# Patient Record
Sex: Female | Born: 1945 | Hispanic: No | Smoking: Never smoker
Health system: Southern US, Community
[De-identification: ages and names within clinical notes are randomized; demographics above are authoritative.]

## PROBLEM LIST (undated history)

## (undated) DIAGNOSIS — F419 Anxiety disorder, unspecified: Secondary | ICD-10-CM

## (undated) DIAGNOSIS — E119 Type 2 diabetes mellitus without complications: Secondary | ICD-10-CM

## (undated) DIAGNOSIS — T7840XA Allergy, unspecified, initial encounter: Secondary | ICD-10-CM

## (undated) DIAGNOSIS — I10 Essential (primary) hypertension: Secondary | ICD-10-CM

## (undated) DIAGNOSIS — A048 Other specified bacterial intestinal infections: Secondary | ICD-10-CM

## (undated) DIAGNOSIS — M419 Scoliosis, unspecified: Secondary | ICD-10-CM

## (undated) DIAGNOSIS — E039 Hypothyroidism, unspecified: Secondary | ICD-10-CM

## (undated) DIAGNOSIS — K5900 Constipation, unspecified: Secondary | ICD-10-CM

## (undated) HISTORY — DX: Other specified bacterial intestinal infections: A04.8

## (undated) HISTORY — PX: NASAL SINUS SURGERY: SHX719

## (undated) HISTORY — PX: TUBAL LIGATION: SHX77

## (undated) HISTORY — PX: APPENDECTOMY: SHX54

## (undated) HISTORY — DX: Scoliosis, unspecified: M41.9

## (undated) HISTORY — DX: Hypothyroidism, unspecified: E03.9

## (undated) HISTORY — DX: Type 2 diabetes mellitus without complications: E11.9

## (undated) HISTORY — PX: NECK LESION BIOPSY: SHX2078

## (undated) HISTORY — PX: UMBILICAL HERNIA REPAIR: SHX196

## (undated) HISTORY — DX: Allergy, unspecified, initial encounter: T78.40XA

## (undated) HISTORY — DX: Constipation, unspecified: K59.00

---

## 1999-05-09 ENCOUNTER — Other Ambulatory Visit: Admission: RE | Admit: 1999-05-09 | Discharge: 1999-05-09 | Payer: Self-pay | Admitting: Internal Medicine

## 2004-08-04 ENCOUNTER — Emergency Department (HOSPITAL_COMMUNITY): Admission: EM | Admit: 2004-08-04 | Discharge: 2004-08-04 | Payer: Self-pay | Admitting: Emergency Medicine

## 2004-11-15 ENCOUNTER — Ambulatory Visit: Payer: Self-pay | Admitting: Internal Medicine

## 2006-12-10 ENCOUNTER — Ambulatory Visit: Payer: Self-pay | Admitting: Internal Medicine

## 2006-12-10 LAB — CONVERTED CEMR LAB: TSH: 4.39 microintl units/mL (ref 0.35–5.50)

## 2007-02-09 ENCOUNTER — Telehealth: Payer: Self-pay | Admitting: Internal Medicine

## 2007-05-06 ENCOUNTER — Telehealth: Payer: Self-pay | Admitting: Internal Medicine

## 2007-07-16 ENCOUNTER — Ambulatory Visit: Payer: Self-pay | Admitting: Internal Medicine

## 2007-07-16 DIAGNOSIS — E039 Hypothyroidism, unspecified: Secondary | ICD-10-CM

## 2007-10-19 ENCOUNTER — Telehealth: Payer: Self-pay | Admitting: Internal Medicine

## 2008-01-25 ENCOUNTER — Ambulatory Visit: Payer: Self-pay | Admitting: Internal Medicine

## 2008-01-25 DIAGNOSIS — L80 Vitiligo: Secondary | ICD-10-CM

## 2008-09-13 ENCOUNTER — Ambulatory Visit: Payer: Self-pay | Admitting: Internal Medicine

## 2008-09-13 ENCOUNTER — Telehealth: Payer: Self-pay | Admitting: *Deleted

## 2008-09-13 DIAGNOSIS — J069 Acute upper respiratory infection, unspecified: Secondary | ICD-10-CM

## 2008-10-04 ENCOUNTER — Ambulatory Visit: Payer: Self-pay | Admitting: Internal Medicine

## 2008-10-04 DIAGNOSIS — R519 Headache, unspecified: Secondary | ICD-10-CM | POA: Insufficient documentation

## 2008-10-04 DIAGNOSIS — R51 Headache: Secondary | ICD-10-CM

## 2008-10-24 ENCOUNTER — Telehealth: Payer: Self-pay | Admitting: Internal Medicine

## 2008-10-28 ENCOUNTER — Telehealth: Payer: Self-pay | Admitting: Internal Medicine

## 2008-10-31 ENCOUNTER — Telehealth: Payer: Self-pay | Admitting: Internal Medicine

## 2008-10-31 ENCOUNTER — Ambulatory Visit: Payer: Self-pay | Admitting: Internal Medicine

## 2008-11-21 ENCOUNTER — Telehealth: Payer: Self-pay | Admitting: Internal Medicine

## 2009-07-19 ENCOUNTER — Telehealth: Payer: Self-pay | Admitting: Internal Medicine

## 2009-07-19 ENCOUNTER — Ambulatory Visit: Payer: Self-pay | Admitting: Family Medicine

## 2009-07-19 DIAGNOSIS — J019 Acute sinusitis, unspecified: Secondary | ICD-10-CM

## 2009-09-22 ENCOUNTER — Telehealth: Payer: Self-pay | Admitting: Internal Medicine

## 2009-10-06 ENCOUNTER — Ambulatory Visit: Payer: Self-pay | Admitting: Internal Medicine

## 2009-10-06 DIAGNOSIS — M549 Dorsalgia, unspecified: Secondary | ICD-10-CM | POA: Insufficient documentation

## 2009-10-24 ENCOUNTER — Telehealth: Payer: Self-pay | Admitting: Internal Medicine

## 2010-01-16 ENCOUNTER — Telehealth: Payer: Self-pay | Admitting: Internal Medicine

## 2010-01-16 ENCOUNTER — Emergency Department (HOSPITAL_COMMUNITY): Admission: EM | Admit: 2010-01-16 | Discharge: 2010-01-16 | Payer: Self-pay | Admitting: Family Medicine

## 2010-01-25 ENCOUNTER — Ambulatory Visit: Payer: Self-pay | Admitting: Family Medicine

## 2010-01-25 DIAGNOSIS — J309 Allergic rhinitis, unspecified: Secondary | ICD-10-CM | POA: Insufficient documentation

## 2010-01-31 ENCOUNTER — Telehealth: Payer: Self-pay | Admitting: Internal Medicine

## 2010-03-23 ENCOUNTER — Telehealth: Payer: Self-pay | Admitting: Internal Medicine

## 2010-06-15 ENCOUNTER — Telehealth: Payer: Self-pay | Admitting: Internal Medicine

## 2010-07-31 ENCOUNTER — Telehealth: Payer: Self-pay | Admitting: Internal Medicine

## 2010-09-03 ENCOUNTER — Telehealth: Payer: Self-pay | Admitting: Internal Medicine

## 2010-09-04 NOTE — Progress Notes (Signed)
Summary: refill alprazolam  Phone Note Refill Request Message from:  Fax from Pharmacy on January 31, 2010 10:13 AM  Refills Requested: Medication #1:  ALPRAZOLAM 0.5 MG  TABS 1/2 - 1 two times a day as needed   Last Refilled: 12/31/2009 [facility]   [phone]   Method Requested: Telephone to Pharmacy Initial call taken by: Duard Brady LPN,  January 31, 2010 10:14 AM    Prescriptions: ALPRAZOLAM 0.5 MG  TABS (ALPRAZOLAM) 1/2 - 1 two times a day as needed  #50 x 2   Entered and Authorized by:   Duard Brady LPN   Signed by:   Duard Brady LPN on 11/91/4782   Method used:   Historical   RxID:   [id number]  [facility]   kik

## 2010-09-04 NOTE — Progress Notes (Signed)
Summary: Amox rx called in  Phone Note Refill Request Call back at [phone] Message from:  Patient on October 24, 2009 2:36 PM  Refills Requested: Medication #1:  AMOXICILLIN 875 MG TABS one by mouth two times a day for 10 days..   Dosage confirmed as above?Dosage Confirmed Patient requesting refill for Amoxicillin for sinus infection.  Initial call taken by: Everrett Coombe,  October 24, 2009 2:36 PM Caller: Patient Summary of Call: Patient is requesting   Follow-up for Phone Call        Amox 500mg  [facility] - spoke with pt - instructed that if no better once completed - to see. KIK Follow-up by: Duard Brady LPN,  October 25, 2009 9:56 AM    Amoxil 500  #30 one three times a day with meals

## 2010-09-04 NOTE — Progress Notes (Signed)
Summary: ? sinus per pt.  Phone Note Call from Patient   Caller: Patient Call For: Julie Savers  MD Summary of Call: Pt is having facial pain, headache, no drainage, top of head hurts really hurts. ? chills and fever. Wants to know if she can have Flonase and Amoxicillin. [facility] [phone] Initial call taken by: Lynann Beaver CMA,  January 16, 2010 2:05 PM  Follow-up for Phone Call        generic flonase  OK;  generic augmentin 875  #20 one BID Follow-up by: Julie Savers  MD,  January 16, 2010 5:05 PM    Prescriptions: AMOXICILLIN 875 MG TABS (AMOXICILLIN) one by mouth two times a day for 10 days.  #20 x 0   Entered by:   Lynann Beaver CMA   Authorized by:   Julie Savers  MD   Signed by:   Lynann Beaver CMA on 01/17/2010   Method used:   Electronically to        [facility] [address] [phone]* (retail)       [address]       [place], [place]  [address]       Ph: [phone] or [phone]       Fax: [phone]   RxID:   [id number] FLUTICASONE PROPIONATE 50 MCG/ACT SUSP (FLUTICASONE PROPIONATE) use daily as needed  #1 x 6   Entered by:   Lynann Beaver CMA   Authorized by:   Julie Savers  MD   Signed by:   Lynann Beaver CMA on 01/17/2010   Method used:   Electronically to        [facility] [address] [phone]* (retail)       [address]       [place], [place]  [address]       Ph: [phone] or [phone]       Fax: [phone]   RxID:   [id number]

## 2010-09-04 NOTE — Letter (Signed)
Summary: Out of Work  Adult nurse at [employer]  [address]   [place], [place] [address]   Phone: [phone]  Fax: [phone]    October 06, 2009   Employee:  BERNARDINA CACHO    To Whom It May Concern:   For Medical reasons, please excuse the above named employee from work for the following dates:  Start:   10-05-2009  End:   10-09-2009   If you need additional information, please feel free to contact our office.         Sincerely,    Gordy Savers  MD

## 2010-09-04 NOTE — Assessment & Plan Note (Signed)
Summary: ?sinus inf/ok per rachel/njr   Vital Signs:  Patient profile:   65 year old female Weight:      133 pounds Temp:     98.4 degrees F oral BP sitting:   120 / 80  (left arm) Cuff size:   regular  Vitals Entered By: Kathrynn Speed CMA (January 25, 2010 4:00 PM) CC: sinus pain & prssure for 2 weeks   CC:  sinus pain & prssure for 2 weeks.  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  She has no fever, earache, sore throat, cough.  She has no facial pain.  She says it hurts on the top of her head.  She denies any neurologic symptoms.  She was given amoxicillin 875 mg b.i.d. on 615.  She has two days of medication left but after 8 days of medication.  The symptoms are unchanged.  She said no history of any sinus surgery.  Current Medications (verified): 1)  Levothyroxine Sodium 75 Mcg Tabs (Levothyroxine Sodium) .Marland Kitchen.. 1 Once Daily 2)  Alprazolam 0.5 Mg  Tabs (Alprazolam) .... 1/2 - 1 Two Times A Day As Needed 3)  Fluticasone Propionate 50 Mcg/act Susp (Fluticasone Propionate) .... Use Daily As Needed 4)  Amoxicillin 875 Mg Tabs (Amoxicillin) .... One By Mouth Two Times A Day For 10 Days.  Allergies (verified): 1)  ! Codeine PMH-FH-SH reviewed for relevance  Social History: Reviewed history from 02/24/2007 and no changes required. Never Smoked Alcohol use-no  Review of Systems      See HPI  Physical Exam  General:  Well-developed,well-nourished,in no acute distress; alert,appropriate and cooperative throughout examination Head:  Normocephalic and atraumatic without obvious abnormalities. No apparent alopecia or balding. Eyes:  No corneal or conjunctival inflammation noted. EOMI. Perrla. Funduscopic exam benign, without hemorrhages, exudates or papilledema. Vision grossly normal. Ears:   External ear exam shows no significant lesions or deformities.  Otoscopic examination reveals clear canals, tympanic membranes are intact bilaterally without bulging, retraction, inflammation or discharge. Hearing is grossly normal bilaterally. Nose:  External nasal examination shows no deformity or inflammation. Nasal mucosa are pink and moist without lesions or exudates. Mouth:  Oral mucosa and oropharynx without lesions or exudates.  Teeth in good repair. Neck:  No deformities, masses, or tenderness noted. Chest Wall:  No deformities, masses, or tenderness noted. Lungs:  Normal respiratory effort, chest expands symmetrically. Lungs are clear to auscultation, no crackles or wheezes. Cervical Nodes:  No lymphadenopathy noted   Problems:  Medical Problems Added: 1)  Dx of Headache  (ICD-784.0) 2)  Dx of Rhinitis  (ICD-477.9)  Impression & Recommendations:  Problem # 1:  RHINITIS (ICD-477.9)  Her updated medication list for this problem includes:    Fluticasone Propionate 50 Mcg/act Susp (Fluticasone propionate) ..... Use daily as needed  Problem # 2:  HEADACHE (ICD-784.0) Assessment: New  The following medications were removed from the medication list:    Tramadol Hcl 50 Mg Tabs (Tramadol hcl) ..... One by mouth q 6 hrs as needed pain  Complete Medication List: 1)  Levothyroxine Sodium 75 Mcg Tabs (Levothyroxine sodium) .Marland Kitchen.. 1 once daily 2)  Alprazolam 0.5 Mg Tabs (Alprazolam) .... 1/2 - 1 two times a day as needed 3)  Fluticasone Propionate 50 Mcg/act Susp (Fluticasone propionate) .... Use daily as needed 4)  Amoxicillin 875 Mg  Tabs (Amoxicillin) .... One by mouth two times a day for 10 days.  Patient Instructions: 1)  stop the amoxicillin.......... take plain Claritin in the morning and use a combination of afrin nasal spray and saline irrigation with a netti pot at bedtime.  Remember.  There is a 5 night limit on the afrin  2)  If you don't see any improvement.  By Monday.  Return to see Dr. Kirtland Bouchard for further evaluation

## 2010-09-04 NOTE — Progress Notes (Signed)
Summary: refill  Phone Note Call from Patient   Caller: Patient Call For: Gordy Savers  MD Summary of Call: wants [facility]ill [facility]olam [facility] Initial call taken by: Raechel Ache, RN,  September 22, 2009 4:18 PM    Prescriptions: ALPRAZOLAM 0.5 MG  TABS (ALPRAZOLAM) 1/2 - 1 two times a day as needed  #50 x 2   Entered by:   Duard Brady LPN   Authorized by:   Gordy Savers  MD   Signed by:   Duard Brady LPN on 72/53/6644   Method used:   Historical   RxID:   [id number]

## 2010-09-04 NOTE — Progress Notes (Signed)
Summary: refill levothroid  Phone Note Refill Request Message from:  Fax from Pharmacy on June 15, 2010 1:02 PM  Refills Requested: Medication #1:  LEVOTHYROXINE SODIUM 75 MCG TABS 1 once daily [facility] [place]   Method Requested: Fax to Local Pharmacy Initial call taken by: Duard Brady LPN,  June 15, 2010 1:02 PM    Prescriptions: LEVOTHYROXINE SODIUM 75 MCG TABS (LEVOTHYROXINE SODIUM) 1 once daily  #100 Each x 0   Entered by:   Duard Brady LPN   Authorized by:   Gordy Savers  MD   Signed by:   Duard Brady LPN on 66/44/0347   Method used:   Historical   RxID:   [id number]  [facility]    KIK

## 2010-09-04 NOTE — Progress Notes (Signed)
Summary: refill alprazolam  Phone Note Refill Request Message from:  Fax from Pharmacy on March 23, 2010 1:30 PM  Refills Requested: Medication #1:  ALPRAZOLAM 0.5 MG  TABS 1/2 - 1 two times a day as needed   Last Refilled: 02/26/2010 [facility] - [phone]   Method Requested: Telephone to Pharmacy Initial call taken by: Duard Brady LPN,  March 23, 2010 1:30 PM  Follow-up for Phone Call        [facility] Follow-up by: Duard Brady LPN,  March 23, 2010 1:33 PM    Prescriptions: ALPRAZOLAM 0.5 MG  TABS (ALPRAZOLAM) 1/2 - 1 two times a day as needed  #50 x 2   Entered by:   Duard Brady LPN   Authorized by:   Gordy Savers  MD   Signed by:   Duard Brady LPN on 45/40/9811   Method used:   Historical   RxID:   [id number]

## 2010-09-04 NOTE — Assessment & Plan Note (Signed)
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  History of Present Illness: 65 year old patient who is seen today for evaluation of right lumbar back pain.  She injured this last week doing heavy lifting.  She has been using some ibuprofen, rest and heat with some benefit.  no history of chronic low back pain.  Pain is maximal right lumbar area and is relieved by rest and aggravated by movement.  she was comfortable  last night and rested without pain.  There is occasionally  suggestion of some radiation down the right upper leg  Preventive Screening-Counseling & Management  Alcohol-Tobacco     Smoking Status: never  Allergies: 1)  ! Codeine  Past History:  Past Medical History: Reviewed history from 10/31/2008 and no changes required. Hypothyroidism  Review of Systems  The patient denies anorexia, fever, weight loss, weight gain, vision loss, decreased hearing, hoarseness, chest pain, syncope, dyspnea on exertion, peripheral edema, prolonged cough, headaches, hemoptysis, abdominal pain, melena, hematochezia, severe indigestion/heartburn, hematuria, incontinence, genital sores, muscle weakness, suspicious skin lesions, difficulty walking, depression, unusual weight change, abnormal bleeding, enlarged lymph nodes, angioedema, and breast masses.    Physical Exam  General:  Well-developed,well-nourished,in no acute distress; alert,appropriate and cooperative throughout examination; normal blood pressure Eyes:  No corneal or conjunctival inflammation noted. EOMI. Perrla.  Funduscopic exam benign, without hemorrhages, exudates or papilledema. Vision grossly normal. Mouth:  Oral mucosa and oropharynx without lesions or exudates.  Teeth in good repair. Neck:  No deformities, masses, or tenderness noted. Lungs:  Normal respiratory effort, chest expands symmetrically. Lungs are clear to auscultation, no crackles or wheezes. Heart:  Normal rate and regular rhythm. S1 and S2 normal without gallop, murmur, click, rub or other extra sounds. Abdomen:  Bowel sounds positive,abdomen soft and non-tender without masses, organomegaly or hernias noted. Msk:  mild tenderness right lumbar region; straight leg testing negative Neurologic:  alert & oriented X3, strength normal in all extremities, sensation intact to light touch, gait normal, and DTRs symmetrical and normal.     Impression & Recommendations:  Problem # 1:  BACK PAIN (ICD-724.5)  Her updated medication list for this problem includes:    Tramadol Hcl 50 Mg Tabs (Tramadol hcl) ..... One by mouth q 6 hrs as needed pain  Problem # 2:  HYPOTHYROIDISM (ICD-244.9)  Her updated medication list for this problem includes:    Levothyroxine Sodium 75 Mcg Tabs (Levothyroxine sodium) .Marland Kitchen... 1 once daily  Complete Medication List: 1)  Levothyroxine Sodium 75 Mcg Tabs (Levothyroxine sodium) .Marland Kitchen.. 1 once daily 2)  Alprazolam 0.5 Mg Tabs (Alprazolam) .... 1/2 - 1 two times a day as needed 3)  Fluticasone Propionate 50 Mcg/act Susp (Fluticasone propionate) .... Use daily as needed 4)  Tramadol Hcl 50 Mg Tabs (Tramadol hcl) .... One by mouth q 6 hrs as needed pain 5)  Amoxicillin 875 Mg Tabs (Amoxicillin) .... One by mouth two times a day for  10 days.  Patient Instructions: 1)  Most patients (90%) with low back pain will improve with time (2-6 weeks). Keep active but avoid activities that are painful. Apply moist heat  to lower back several times a day. 2)  Arthrotec 75 one twice daily Prescriptions: TRAMADOL HCL 50 MG TABS  (TRAMADOL HCL) one by mouth q 6 hrs as needed pain  #30 x 3   Entered and Authorized by:   Gordy Savers  MD   Signed by:   Gordy Savers  MD on 10/06/2009   Method used:   Print then Give to Patient   RxID:   [id number] ALPRAZOLAM 0.5 MG  TABS (ALPRAZOLAM) 1/2 - 1 two times a day as needed  #50 x 2   Entered and Authorized by:   Gordy Savers  MD   Signed by:   Gordy Savers  MD on 10/06/2009   Method used:   Print then Give to Patient   RxID:   [id number] LEVOTHYROXINE SODIUM 75 MCG TABS (LEVOTHYROXINE SODIUM) 1 once daily  #100 Each x 1   Entered and Authorized by:   Gordy Savers  MD   Signed by:   Gordy Savers  MD on 10/06/2009   Method used:   Print then Give to Patient   RxID:   [id number]

## 2010-09-06 NOTE — Progress Notes (Signed)
Summary: refill alprazolam  Phone Note Refill Request Message from:  Fax from Pharmacy on July 31, 2010 9:28 AM  Refills Requested: Medication #1:  ALPRAZOLAM 0.5 MG  TABS 1/2 - 1 two times a day as needed   Last Refilled: 06/24/2010 [facility]   Method Requested: Fax to Local Pharmacy Initial call taken by: Duard Brady LPN,  July 31, 2010 9:29 AM  Follow-up for Phone Call        #90   RF 2 Follow-up by: Gordy Savers  MD,  July 31, 2010 12:32 PM    Prescriptions: ALPRAZOLAM 0.5 MG  TABS (ALPRAZOLAM) 1/2 - 1 two times a day as needed  #90 x 2   Entered by:   Duard Brady LPN   Authorized by:   Gordy Savers  MD   Signed by:   Duard Brady LPN on 81/19/1478   Method used:   Historical   RxID:   [id number]  [facility]    kik

## 2010-09-12 NOTE — Progress Notes (Signed)
  Phone Note Call from Patient   Caller: Patient Call For: Gordy Savers  MD Summary of Call: Pt. is having facial pain with congestion and fever this weekend.  Would like an antibiotic for sinus infection. [facility]ico[facility]) 05/09/1946 Initial call taken by: Lynann Beaver CMA AAMA,  September 03, 2010 9:07 AM  Follow-up for Phone Call        give instructions for URI; notify that bacterial infection very unlikely if symptoms are less than 10 days;   Follow-up by: Gordy Savers  MD,  September 03, 2010 12:48 PM  Additional Follow-up for Phone Call Additional follow up Details #1::        Notified pt to use Mucinex DM and Advil as needed, and call back if worse after 10 days. Additional Follow-up by: Lynann Beaver CMA AAMA,  September 03, 2010 1:53 PM

## 2010-09-26 ENCOUNTER — Inpatient Hospital Stay (INDEPENDENT_AMBULATORY_CARE_PROVIDER_SITE_OTHER)
Admission: RE | Admit: 2010-09-26 | Discharge: 2010-09-26 | Disposition: A | Discharge status: Home / Self Care | Payer: Medicare PPO | Source: Ambulatory Visit | Attending: Emergency Medicine | Admitting: Emergency Medicine

## 2010-09-26 DIAGNOSIS — N3090 Cystitis, unspecified without hematuria: Secondary | ICD-10-CM

## 2010-09-26 LAB — POCT URINALYSIS DIPSTICK
Ketones, ur: 15 mg/dL — AB
Protein, ur: >=300 mg/dL — AB
Specific Gravity, Urine: 1.01 (ref 1.005–1.030)

## 2010-09-28 LAB — URINE CULTURE
Colony Count: >=100000
Culture  Setup Time: 201202222152

## 2010-10-02 ENCOUNTER — Other Ambulatory Visit (INDEPENDENT_AMBULATORY_CARE_PROVIDER_SITE_OTHER): Payer: Medicare PPO | Admitting: Internal Medicine

## 2010-10-02 DIAGNOSIS — E039 Hypothyroidism, unspecified: Secondary | ICD-10-CM

## 2010-10-02 DIAGNOSIS — Z0000 Encounter for general adult medical examination without abnormal findings: Secondary | ICD-10-CM

## 2010-10-02 DIAGNOSIS — E785 Hyperlipidemia, unspecified: Secondary | ICD-10-CM

## 2010-10-02 LAB — POCT URINALYSIS DIPSTICK
Ketones, UA: NEGATIVE
Protein, UA: NEGATIVE
Spec Grav, UA: 1.02
Urobilinogen, UA: 0.2
pH, UA: 5.5

## 2010-10-02 LAB — LDL CHOLESTEROL, DIRECT: Direct LDL: 153 mg/dL

## 2010-10-02 LAB — HEPATIC FUNCTION PANEL
AST: 20 U/L (ref 0–37)
Alkaline Phosphatase: 61 U/L (ref 39–117)
Total Bilirubin: 0.8 mg/dL (ref 0.3–1.2)

## 2010-10-02 LAB — LIPID PANEL
Cholesterol: 239 mg/dL — ABNORMAL HIGH (ref 0–200)
HDL: 68 mg/dL (ref >39.00)
Total CHOL/HDL Ratio: 4
VLDL: 18.2 mg/dL (ref 0.0–40.0)

## 2010-10-02 LAB — CBC WITH DIFFERENTIAL/PLATELET
Basophils Absolute: 0.1 10*3/uL (ref 0.0–0.1)
Eosinophils Absolute: 0.3 10*3/uL (ref 0.0–0.7)
Lymphocytes Relative: 23.8 % (ref 12.0–46.0)
MCHC: 34 g/dL (ref 30.0–36.0)
Monocytes Relative: 5.7 % (ref 3.0–12.0)
Platelets: 284 10*3/uL (ref 150.0–400.0)
RDW: 13.8 % (ref 11.5–14.6)

## 2010-10-02 LAB — BASIC METABOLIC PANEL
BUN: 15 mg/dL (ref 6–23)
Calcium: 9.3 mg/dL (ref 8.4–10.5)
Creatinine, Ser: 1.1 mg/dL (ref 0.4–1.2)
GFR: 55.8 mL/min — ABNORMAL LOW (ref >60.00)
Glucose, Bld: 92 mg/dL (ref 70–99)
Sodium: 137 mEq/L (ref 135–145)

## 2010-10-03 ENCOUNTER — Other Ambulatory Visit: Payer: Self-pay

## 2010-10-03 ENCOUNTER — Other Ambulatory Visit: Payer: Self-pay | Admitting: Internal Medicine

## 2010-10-03 DIAGNOSIS — E039 Hypothyroidism, unspecified: Secondary | ICD-10-CM

## 2010-10-03 MED ORDER — ALPRAZOLAM 0.5 MG PO TABS: 0.5000 mg | ORAL_TABLET | Freq: Two times a day (BID) | ORAL | Status: DC | PRN

## 2010-10-03 MED ORDER — LEVOTHYROXINE SODIUM 100 MCG PO TABS: 100.0000 ug | ORAL_TABLET | Freq: Every day | ORAL | Status: DC

## 2010-10-03 NOTE — Progress Notes (Signed)
Quick Note:  Spoke with pt - informed of labs - on - will change med record and call out new rx [facility] [address]. KIK ______

## 2010-10-03 NOTE — Telephone Encounter (Signed)
Faxed back to [facility]

## 2010-10-08 ENCOUNTER — Encounter: Payer: Self-pay | Admitting: Internal Medicine

## 2010-10-08 ENCOUNTER — Ambulatory Visit (INDEPENDENT_AMBULATORY_CARE_PROVIDER_SITE_OTHER): Payer: Medicare PPO | Admitting: Internal Medicine

## 2010-10-08 VITALS — BP 110/70 | HR 77 | Temp 98.0°F | Resp 16 | Ht 65.0 in | Wt 133.0 lb

## 2010-10-08 DIAGNOSIS — E039 Hypothyroidism, unspecified: Secondary | ICD-10-CM

## 2010-10-08 DIAGNOSIS — J309 Allergic rhinitis, unspecified: Secondary | ICD-10-CM

## 2010-10-08 DIAGNOSIS — Z136 Encounter for screening for cardiovascular disorders: Secondary | ICD-10-CM

## 2010-10-08 DIAGNOSIS — Z0000 Encounter for general adult medical examination without abnormal findings: Secondary | ICD-10-CM

## 2010-10-08 MED ORDER — ALPRAZOLAM 0.5 MG PO TABS: 0.5000 mg | ORAL_TABLET | Freq: Two times a day (BID) | ORAL | Status: DC | PRN

## 2010-10-08 MED ORDER — FLUTICASONE PROPIONATE 50 MCG/ACT NA SUSP: 1.0000 | Freq: Every day | NASAL | Status: DC

## 2010-10-08 MED ORDER — LEVOTHYROXINE SODIUM 100 MCG PO TABS: 100.0000 ug | ORAL_TABLET | Freq: Every day | ORAL | Status: DC

## 2010-10-08 NOTE — Patient Instructions (Signed)
It is important that you exercise regularly, at least 20 minutes 3 to 4 times per week.  If you develop chest pain or shortness of breath seek  medical attention.  Schedule your mammogram.  Schedule your colonoscopy to help detect colon cancer.  Take a calcium supplement, plus [phone] units of vitamin D  Return in one year for follow-up

## 2010-10-08 NOTE — Progress Notes (Signed)
Subjective:    Patient ID: Julie Duffy, female    DOB: 03-Jun-1946, 65 y.o.   MRN: [id number]  HPI  65 year old patient who is seen today for a preventative health examination. She has a history of hypothyroidism and her levothyroxin dose has recently been up titrated.  She does quite well. He has not been seen for a physical in some time. She's never had a screening colonoscopy in spite of a family history of colon cancer. She has had no mammograms. She does have a history of allergic rhinitis vitiligo and occasional back pain and headaches. In general she does quite well. She is a lifelong nonsmoker.  1. Risk factors, based on past  M,S,F history-   No significant cardiovascular risk factors; mother had congestive heart failure at a later age  81.  Physical activities:  No activity  restrictions but no regular exercise  3.  Depression/mood: history depression or mood disorder  4.  Hearing: no deficits  5.  ADL's: independent in all aspects of daily living  6.  Fall risk: low  7.  Home safety: no problems identified  8.  Height weight, and visual acuity; height and weight stable no change in visual acuity  9.  Counseling: colonoscopy vitamin D and calcium supplementation and heart healthy diet all encouraged. Regular exercise program encouraged  10. Lab orders based on risk factors: laboratory profile reviewed TSH was elevated and Synthroid dose adjusted  11. Referral-  GI for screening colonoscopy 12. Care plan:  Mammogram encouraged heart healthy diet regular exercise  13. Cognitive assessment:  Alert and oriented with normal affect no cognitive dysfunction     Review of Systems  Constitutional: Negative for fever, appetite change, fatigue and unexpected weight change.  HENT: Negative for hearing loss, ear pain, nosebleeds, congestion, sore throat, mouth sores, trouble swallowing, neck stiffness, dental problem, voice change, sinus pressure and tinnitus.   Eyes: Negative for  photophobia, pain, redness and visual disturbance.  Respiratory: Negative for cough, chest tightness and shortness of breath.   Cardiovascular: Negative for chest pain, palpitations and leg swelling.  Gastrointestinal: Negative for nausea, vomiting, abdominal pain, diarrhea, constipation, blood in stool, abdominal distention and rectal pain.  Genitourinary: Negative for dysuria, urgency, frequency, hematuria, flank pain, vaginal bleeding, vaginal discharge, difficulty urinating, genital sores, vaginal pain, menstrual problem and pelvic pain.  Musculoskeletal: Negative for back pain and arthralgias.  Skin: Negative for rash.  Neurological: Negative for dizziness, syncope, speech difficulty, weakness, light-headedness, numbness and headaches.  Hematological: Negative for adenopathy. Does not bruise/bleed easily.  Psychiatric/Behavioral: Negative for suicidal ideas, behavioral problems, self-injury, dysphoric mood and agitation. The patient is not nervous/anxious.        Objective:   Physical Exam  Constitutional: She is oriented to person, place, and time. She appears well-developed and well-nourished.  HENT:  Head: Normocephalic and atraumatic.  Right Ear: External ear normal.  Left Ear: External ear normal.  Mouth/Throat: Oropharynx is clear and moist.  Eyes: Conjunctivae and EOM are normal.  Neck: Normal range of motion. Neck supple. No JVD present. No thyromegaly present.  Cardiovascular: Normal rate, regular rhythm, normal heart sounds and intact distal pulses.   No murmur heard. Pulmonary/Chest: Effort normal and breath sounds normal. She has no wheezes. She has no rales.  Abdominal: Soft. Bowel sounds are normal. She exhibits no distension and no mass. There is no tenderness. There is no rebound and no guarding.  Genitourinary: Vagina normal and uterus normal. Guaiac negative stool. No vaginal discharge  found.  Musculoskeletal: Normal range of motion. She exhibits no edema and no  tenderness.  Neurological: She is alert and oriented to person, place, and time. She has normal reflexes. No cranial nerve deficit. She exhibits normal muscle tone. Coordination normal.  Skin: Skin is warm and dry. No rash noted.  Psychiatric: She has a normal mood and affect. Her behavior is normal.          Assessment & Plan:   unremarkable clinical examination  Hypothyroidism  Allergic rhinitis- we'll place on fluticasone and continue over-the-counter antihistamines  Calcium and vitamin D recommended  Mammogram recommended  Screening colonoscopy recommended

## 2010-11-02 ENCOUNTER — Telehealth: Payer: Self-pay | Admitting: *Deleted

## 2010-11-02 NOTE — Telephone Encounter (Signed)
When patients  call in for this type of request make them aware that it is not the office policy to call in antibiotics without an office visit

## 2010-11-02 NOTE — Telephone Encounter (Signed)
Asking for antibiotic for sinus infection and needs it called to [facility].  Please call pt back.

## 2010-11-02 NOTE — Telephone Encounter (Signed)
Per Dr. Kirtland Bouchard ........Marland Kitchenneeds office visit.

## 2010-11-05 ENCOUNTER — Telehealth: Payer: Self-pay | Admitting: *Deleted

## 2010-11-05 NOTE — Telephone Encounter (Signed)
Pt did not call back for appt.

## 2010-11-23 ENCOUNTER — Other Ambulatory Visit (INDEPENDENT_AMBULATORY_CARE_PROVIDER_SITE_OTHER): Payer: Self-pay | Admitting: Otolaryngology

## 2010-11-27 ENCOUNTER — Ambulatory Visit
Admission: RE | Admit: 2010-11-27 | Discharge: 2010-11-27 | Disposition: A | Discharge status: Home / Self Care | Payer: Medicare PPO | Source: Ambulatory Visit | Attending: Otolaryngology | Admitting: Otolaryngology

## 2010-11-28 ENCOUNTER — Ambulatory Visit (AMBULATORY_SURGERY_CENTER): Payer: Medicare PPO | Admitting: *Deleted

## 2010-11-28 ENCOUNTER — Encounter: Payer: Self-pay | Admitting: Gastroenterology

## 2010-11-28 VITALS — Ht 64.0 in | Wt 130.0 lb

## 2010-11-28 DIAGNOSIS — Z1211 Encounter for screening for malignant neoplasm of colon: Secondary | ICD-10-CM

## 2010-11-28 DIAGNOSIS — Z800 Family history of malignant neoplasm of digestive organs: Secondary | ICD-10-CM

## 2010-11-28 MED ORDER — PEG-KCL-NACL-NASULF-NA ASC-C 100 G PO SOLR: 1.0000 | Freq: Once | ORAL | Status: AC

## 2010-12-10 ENCOUNTER — Telehealth: Payer: Self-pay | Admitting: Gastroenterology

## 2010-12-10 NOTE — Telephone Encounter (Signed)
Pt had concerns about being on clear liquids all day day before procedure.  Reviewed clear liquid diet with patient.  Julie Duffy

## 2010-12-11 ENCOUNTER — Encounter: Payer: Self-pay | Admitting: Gastroenterology

## 2010-12-12 ENCOUNTER — Encounter: Payer: Self-pay | Admitting: Gastroenterology

## 2010-12-12 ENCOUNTER — Ambulatory Visit (AMBULATORY_SURGERY_CENTER): Payer: Medicare PPO | Admitting: Gastroenterology

## 2010-12-12 VITALS — BP 146/74 | HR 60 | Temp 97.9°F | Resp 18 | Ht 64.0 in | Wt 130.0 lb

## 2010-12-12 DIAGNOSIS — Z1211 Encounter for screening for malignant neoplasm of colon: Secondary | ICD-10-CM

## 2010-12-12 DIAGNOSIS — Z800 Family history of malignant neoplasm of digestive organs: Secondary | ICD-10-CM

## 2010-12-12 MED ORDER — SODIUM CHLORIDE 0.9 % IV SOLN: 500.0000 mL | INTRAVENOUS | Status: DC

## 2010-12-12 NOTE — Patient Instructions (Signed)
Follow discharge instructions.  Resume your medications.  Next colonoscopy in 5 years.

## 2010-12-13 ENCOUNTER — Telehealth: Payer: Self-pay | Admitting: *Deleted

## 2010-12-13 NOTE — Telephone Encounter (Signed)

## 2010-12-14 ENCOUNTER — Telehealth: Payer: Self-pay | Admitting: Gastroenterology

## 2010-12-14 NOTE — Telephone Encounter (Signed)
Informed pt it's ok to resume her normal meds- pt verbalized understanding.

## 2011-01-02 ENCOUNTER — Encounter: Payer: Self-pay | Admitting: Internal Medicine

## 2011-01-02 ENCOUNTER — Ambulatory Visit (INDEPENDENT_AMBULATORY_CARE_PROVIDER_SITE_OTHER): Payer: Medicare PPO | Admitting: Internal Medicine

## 2011-01-02 DIAGNOSIS — E039 Hypothyroidism, unspecified: Secondary | ICD-10-CM

## 2011-01-02 LAB — TSH: TSH: 0.2 u[IU]/mL — ABNORMAL LOW (ref 0.35–5.50)

## 2011-01-02 MED ORDER — FLUTICASONE PROPIONATE 50 MCG/ACT NA SUSP: 1.0000 | Freq: Every day | NASAL | Status: DC

## 2011-01-02 NOTE — Patient Instructions (Signed)
Drink as much fluid as you  can tolerate over the next few days 

## 2011-01-02 NOTE — Progress Notes (Signed)
  Subjective:    Patient ID: Julie Duffy, female    DOB: Feb 19, 1946, 65 y.o.   MRN: [id number]  HPI  65 year old patient who is seen today with some low blood pressure concerns. Yesterday she felt slightly weak and was noted to have a home blood pressure reading of 98/50 with a pulse rate of 47. She states for the past 2 weeks she has been a bit sluggish. Approximately 3 months ago her levothyroxine dose was up titrated to 100 mcg daily due to an elevated TSH. Today she feels improved. Denies any history of syncope    Review of Systems  Constitutional: Positive for fatigue.  HENT: Negative for hearing loss, congestion, sore throat, rhinorrhea, dental problem, sinus pressure and tinnitus.   Eyes: Negative for pain, discharge and visual disturbance.  Respiratory: Negative for cough and shortness of breath.   Cardiovascular: Negative for chest pain, palpitations and leg swelling.  Gastrointestinal: Negative for nausea, vomiting, abdominal pain, diarrhea, constipation, blood in stool and abdominal distention.  Genitourinary: Negative for dysuria, urgency, frequency, hematuria, flank pain, vaginal bleeding, vaginal discharge, difficulty urinating, vaginal pain and pelvic pain.  Musculoskeletal: Negative for joint swelling, arthralgias and gait problem.  Skin: Negative for rash.  Neurological: Negative for dizziness, syncope, speech difficulty, weakness, numbness and headaches.  Hematological: Negative for adenopathy.  Psychiatric/Behavioral: Negative for behavioral problems, dysphoric mood and agitation. The patient is not nervous/anxious.        Objective:   Physical Exam  Constitutional: She is oriented to person, place, and time. She appears well-developed and well-nourished. No distress.       Blood pressure as high as 120/80 100/70 on arrival  HENT:  Head: Normocephalic.  Right Ear: External ear normal.  Left Ear: External ear normal.  Mouth/Throat: Oropharynx is clear and moist.    Eyes: Conjunctivae and EOM are normal. Pupils are equal, round, and reactive to light.  Neck: Normal range of motion. Neck supple. No thyromegaly present.  Cardiovascular: Normal rate, regular rhythm, normal heart sounds and intact distal pulses.        Pulse rate 50  Pulmonary/Chest: Effort normal and breath sounds normal.       O2 saturation 97%  Abdominal: Soft. Bowel sounds are normal. She exhibits no mass. There is no tenderness.  Musculoskeletal: Normal range of motion.  Lymphadenopathy:    She has no cervical adenopathy.  Neurological: She is alert and oriented to person, place, and time.  Skin: Skin is warm and dry. No rash noted.  Psychiatric: She has a normal mood and affect. Her behavior is normal.          Assessment & Plan:   Hypothyroidism Mild sinus bradycardia  Disposition we'll check a TSH today. Will likely require up titration. She has been asked to force fluids especially with the warmer weather. Will return here when necessary

## 2011-01-03 ENCOUNTER — Ambulatory Visit: Payer: Medicare PPO | Admitting: Internal Medicine

## 2011-01-03 ENCOUNTER — Other Ambulatory Visit: Payer: Self-pay | Admitting: Internal Medicine

## 2011-01-03 MED ORDER — LEVOTHYROXINE SODIUM 88 MCG PO TABS: 88.0000 ug | ORAL_TABLET | Freq: Every day | ORAL | Status: DC

## 2011-01-03 NOTE — Progress Notes (Signed)
Quick Note:  Spoke with pt - informed of labs and new med to be started . [facility] [address] ______

## 2011-03-15 ENCOUNTER — Other Ambulatory Visit: Payer: Self-pay | Admitting: *Deleted

## 2011-03-15 MED ORDER — ALPRAZOLAM 0.5 MG PO TABS: 0.5000 mg | ORAL_TABLET | Freq: Two times a day (BID) | ORAL | Status: DC | PRN

## 2011-03-20 NOTE — Progress Notes (Signed)
Addended by: Sarina Ill ANN on: 03/20/2011 03:42 PM   Modules accepted: Orders, Level of Service

## 2011-04-21 ENCOUNTER — Other Ambulatory Visit: Payer: Self-pay | Admitting: Internal Medicine

## 2011-06-06 ENCOUNTER — Other Ambulatory Visit: Payer: Self-pay

## 2011-06-06 MED ORDER — ALPRAZOLAM 0.5 MG PO TABS: 0.5000 mg | ORAL_TABLET | Freq: Two times a day (BID) | ORAL | Status: DC | PRN

## 2011-06-06 NOTE — Telephone Encounter (Signed)
Called into [facility]

## 2011-06-14 ENCOUNTER — Other Ambulatory Visit: Payer: Self-pay | Admitting: Internal Medicine

## 2011-06-17 NOTE — Telephone Encounter (Signed)
Called into [facility] - they did not recv phone from 06/06/2011

## 2011-07-01 ENCOUNTER — Telehealth: Payer: Self-pay | Admitting: *Deleted

## 2011-07-01 NOTE — Telephone Encounter (Signed)
patient  Is calling because she has been awaken at night with elevated blood pressure, shaking, nausea, and very hot.  She does have a history of thyroid problems.  She has called cardiology but can't be seen until Thursday.  Her blood pressures have been 160-170/100.  Any suggestions?

## 2011-07-01 NOTE — Telephone Encounter (Signed)
Spoke with patient.

## 2011-07-01 NOTE — Telephone Encounter (Signed)
Hold thyroid med; ROV this week

## 2011-07-04 ENCOUNTER — Encounter: Payer: Self-pay | Admitting: Cardiovascular Disease

## 2011-07-04 ENCOUNTER — Ambulatory Visit (INDEPENDENT_AMBULATORY_CARE_PROVIDER_SITE_OTHER): Payer: Medicare PPO | Admitting: Cardiovascular Disease

## 2011-07-04 VITALS — BP 153/81 | HR 58 | Ht 64.0 in | Wt 133.0 lb

## 2011-07-04 DIAGNOSIS — I10 Essential (primary) hypertension: Secondary | ICD-10-CM

## 2011-07-04 NOTE — Assessment & Plan Note (Signed)
She has swings in her BP with very strange episodes of burning over her body at night. Will get echocardiogram to exclude structural heart disease and assess LV function. Will also get renal artery dopplers to exclude renal artery stenosis. Her symptoms are very odd. This could all be related to menopausal changes although she reports having undergone menopause many years ago. This could be related to her thyroid. She has plans to see Dr. Lesia Hausen tomorrow. Further down on the differential is carcinoid syndrome. If no resolution, of symptoms over the next few weeks, a workup for carcinoid should be pursued.

## 2011-07-04 NOTE — Patient Instructions (Signed)
Your physician recommends that you schedule a follow-up appointment in: 2 weeks  Your physician has requested that you have an echocardiogram. Echocardiography is a painless test that uses sound waves to create images of your heart. It provides your doctor with information about the size and shape of your heart and how well your heart's chambers and valves are working. This procedure takes approximately one hour. There are no restrictions for this procedure.   Your physician has requested that you have a renal artery duplex. During this test, an ultrasound is used to evaluate blood flow to the kidneys. Allow one hour for this exam. Do not eat after midnight the day before and avoid carbonated beverages. Take your medications as you usually do.

## 2011-07-04 NOTE — Progress Notes (Signed)
   History of Present Illness: 65 yo WF with history of hypothyroidism here today for cardiac evaluation. She tells me that she has no known cardiac disease. She is a self referral today. She tells me that when she is in a deep sleep she occasionally wakes and feels burning in her arms, face, neck. Feels like "burning in my veins". She feels then like she may need to have a bowel movement. No diarrhea. She has mentioned this in the past to providers. This has been occurring for the last year but worse over the last month. When this happens, her BP is elevated, often as high as 150 systolically. She is known to have blood pressures of 110-110 during the day. She has already gone through menopause but she used estrogen patches and had no hot flashes. No chest pain, SOB, palpitations, near syncope or syncope.   Past Medical History  Diagnosis Date  . Hypothyroid   . Allergy     seasonal  . Scoliosis     Past Surgical History  Procedure Date  . Nasal sinus surgery   . Neck lesion biopsy     cysts benign  . Umbilical hernia repair   . Appendectomy   . Tubal ligation     Current Outpatient Prescriptions  Medication Sig Dispense Refill  . ALPRAZolam (XANAX) 0.5 MG tablet TAKE 1/2 TO 1 TABLET BY MOUTH TWICE DAILY AS NEEDED  50 tablet  1  . fluticasone (FLONASE) 50 MCG/ACT nasal spray Place 1 spray into the nose daily.  16 g  2  . levothyroxine (SYNTHROID) 88 MCG tablet Take 1 tablet (88 mcg total) by mouth daily.  90 tablet  3    Allergies  Allergen Reactions  . Clindamycin/Lincomycin Cross Reactors Other (See Comments)    GI intolerance  . Codeine     History   Social History  . Marital Status: Divorced    Spouse Name: N/A    Number of Children: N/A  . Years of Education: N/A   Occupational History  . Not on file.   Social History Main Topics  . Smoking status: Never Smoker   . Smokeless tobacco: Never Used  . Alcohol Use: No  . Drug Use: No  . Sexually Active: Not on file    Other Topics Concern  . Not on file   Social History Narrative  . No narrative on file    Family History  Problem Relation Age of Onset  . Cancer Mother     colon ca  . Heart disease Mother   . Colon cancer Mother   . Cancer Father     Review of Systems:  As stated in the HPI and otherwise negative.   BP 153/81  Pulse 58  Ht 5\' 4"  (1.626 m)  Wt 133 lb (60.328 kg)  BMI 22.83 kg/m2  Physical Examination: General: Well developed, well nourished, NAD HEENT: OP clear, mucus membranes moist SKIN: warm, dry. No rashes. Neuro: No focal deficits Musculoskeletal: Muscle strength 5/5 all ext Psychiatric: Mood and affect normal Neck: No JVD, no carotid bruits, no thyromegaly, no lymphadenopathy. Lungs:Clear bilaterally, no wheezes, rhonci, crackles Cardiovascular: Regular rate and rhythm. No murmurs, gallops or rubs. Abdomen:Soft. Bowel sounds present. Non-tender.  Extremities: No lower extremity edema. Pulses are 2 + in the bilateral DP/PT.  EKG: NSR, rate 62 bpm. Possible right atrial enlargement.

## 2011-07-05 ENCOUNTER — Encounter: Payer: Self-pay | Admitting: Internal Medicine

## 2011-07-05 ENCOUNTER — Ambulatory Visit: Payer: Medicare PPO | Admitting: Cardiology

## 2011-07-05 ENCOUNTER — Ambulatory Visit (INDEPENDENT_AMBULATORY_CARE_PROVIDER_SITE_OTHER): Payer: Medicare PPO | Admitting: Internal Medicine

## 2011-07-05 VITALS — BP 128/80

## 2011-07-05 DIAGNOSIS — E039 Hypothyroidism, unspecified: Secondary | ICD-10-CM

## 2011-07-05 DIAGNOSIS — R232 Flushing: Secondary | ICD-10-CM

## 2011-07-05 NOTE — Progress Notes (Signed)
  Subjective:    Patient ID: Julie Duffy, female    DOB: 06-16-1946, 65 y.o.   MRN: [id number]  HPI  65 year old patient who has a history of treated hypothyroidism. Intermittently for the past year and worsened over the past month she has had the nocturnal awakening due to what she describes as burning and tingling. This affects primarily her arms but also her facial area. There has been no other constitutional complaints. She does have a history of mild anxiety disorder and does take alprazolam. She was self referred to cardiology yesterday and a 2-D echocardiogram and renal artery ultrasound ordered to exclude structural heart disease and renal artery stenosis.    Review of Systems  Neurological:       Tingling and burning dysesthesias of her arms and face       Objective:   Physical Exam  Constitutional: She is oriented to person, place, and time. She appears well-developed and well-nourished. No distress.       Blood pressure 120/80    HENT:  Head: Normocephalic.  Right Ear: External ear normal.  Left Ear: External ear normal.  Mouth/Throat: Oropharynx is clear and moist.  Eyes: Conjunctivae and EOM are normal. Pupils are equal, round, and reactive to light.  Neck: Normal range of motion. Neck supple. No thyromegaly present.  Cardiovascular: Normal rate, regular rhythm, normal heart sounds and intact distal pulses.   Pulmonary/Chest: Effort normal and breath sounds normal.  Abdominal: Soft. Bowel sounds are normal. She exhibits no mass. There is no tenderness.  Musculoskeletal: Normal range of motion.  Lymphadenopathy:    She has no cervical adenopathy.  Neurological: She is alert and oriented to person, place, and time.  Skin: Skin is warm and dry. No rash noted.  Psychiatric: She has a normal mood and affect. Her behavior is normal.          Assessment & Plan:   Nocturnal dysesthesias. We'll proceed with cardiac investigation first. We'll check a TSH today. We'll  recheck in 4 weeks and assess her status. Due to the chronicity of her symptoms if they persist may be out of bed to rule out esoteric problem such as carcinoid and pheochromocytoma

## 2011-07-05 NOTE — Patient Instructions (Signed)
Avoids foods high in acid such as tomatoes citrus juices, and spicy foods.  Avoid eating within two hours of lying down or before exercising.   Return in one month for follow-up

## 2011-07-09 ENCOUNTER — Telehealth: Payer: Self-pay

## 2011-07-09 NOTE — Telephone Encounter (Signed)
Pt called back with questions about her thyroid levels and medication.  Pt is aware to take an extra thyroid pill until her TSH levels are drawn again.

## 2011-07-09 NOTE — Progress Notes (Signed)
Quick Note:  Spoke with pt- instructed to take extra tablet on Sunday. ______

## 2011-07-17 ENCOUNTER — Ambulatory Visit (HOSPITAL_COMMUNITY): Payer: Medicare PPO | Attending: Cardiovascular Disease | Admitting: Radiology

## 2011-07-17 ENCOUNTER — Encounter (INDEPENDENT_AMBULATORY_CARE_PROVIDER_SITE_OTHER): Payer: Medicare PPO | Admitting: Cardiology

## 2011-07-17 DIAGNOSIS — I10 Essential (primary) hypertension: Secondary | ICD-10-CM | POA: Insufficient documentation

## 2011-07-17 DIAGNOSIS — Z79899 Other long term (current) drug therapy: Secondary | ICD-10-CM

## 2011-07-24 ENCOUNTER — Ambulatory Visit: Payer: Medicare PPO | Admitting: Cardiovascular Disease

## 2011-08-06 ENCOUNTER — Encounter (HOSPITAL_COMMUNITY): Payer: Self-pay | Admitting: *Deleted

## 2011-08-06 ENCOUNTER — Emergency Department (HOSPITAL_COMMUNITY)
Admission: EM | Admit: 2011-08-06 | Discharge: 2011-08-06 | Disposition: A | Discharge status: Home / Self Care | Payer: Medicare PPO | Attending: Emergency Medicine | Admitting: Emergency Medicine

## 2011-08-06 DIAGNOSIS — F411 Generalized anxiety disorder: Secondary | ICD-10-CM | POA: Insufficient documentation

## 2011-08-06 DIAGNOSIS — R209 Unspecified disturbances of skin sensation: Secondary | ICD-10-CM | POA: Insufficient documentation

## 2011-08-06 DIAGNOSIS — E039 Hypothyroidism, unspecified: Secondary | ICD-10-CM | POA: Insufficient documentation

## 2011-08-06 DIAGNOSIS — F419 Anxiety disorder, unspecified: Secondary | ICD-10-CM

## 2011-08-06 DIAGNOSIS — Z79899 Other long term (current) drug therapy: Secondary | ICD-10-CM | POA: Insufficient documentation

## 2011-08-06 HISTORY — DX: Anxiety disorder, unspecified: F41.9

## 2011-08-06 LAB — POCT I-STAT, CHEM 8
Calcium, Ion: 1.2 mmol/L (ref 1.12–1.32)
Glucose, Bld: 98 mg/dL (ref 70–99)
HCT: 43 % (ref 36.0–46.0)
Hemoglobin: 14.6 g/dL (ref 12.0–15.0)

## 2011-08-06 MED ORDER — DIAZEPAM 5 MG PO TABS
5.0000 mg | ORAL_TABLET | Freq: Once | ORAL | Status: AC
  Administered 2011-08-06: 5 mg via ORAL
  Filled 2011-08-06: qty 1

## 2011-08-06 MED ORDER — HYDROCODONE-ACETAMINOPHEN 5-325 MG PO TABS
1.0000 | ORAL_TABLET | Freq: Once | ORAL | Status: DC
  Filled 2011-08-06: qty 1

## 2011-08-06 MED ORDER — DIAZEPAM 5 MG PO TABS: 5.0000 mg | ORAL_TABLET | Freq: Three times a day (TID) | ORAL | Status: AC | PRN

## 2011-08-06 NOTE — ED Notes (Signed)
LATE NOTE:  Vicodin wasted in [place] witnessed by Ruthine Dose

## 2011-08-06 NOTE — ED Notes (Signed)
PT has had a nervous problem since October 2012 and then turned into burning all over her body.  Pt has been taking xanax for symptoms.  Pt sts last pm took xanax at 1500 and then took some before bed and then again at 0300.  Pt woke up at 0400 and felt withdrawal from it.  Pt reports shaking and burning all over.  No chest pains.  Patient reports burning in shoulders and legs

## 2011-08-06 NOTE — ED Provider Notes (Signed)
History     CSN: [id number]  Arrival date & time 08/06/11  0734   First MD Initiated Contact with Patient 08/06/11 [phone]      No chief complaint on file.   (Consider location/radiation/quality/duration/timing/severity/associated sxs/prior treatment) HPI Patient is a chronic history of anxiety that causes her to have burning in all of her body and extremities.  She states that her doctors been following her for this for an extended period of time.  The patient states that she did not have any relief from her Xanax this morning for her burning in her extremities and body.  She states she murmurs department she was worried that the Xanax was not effective.  She has no chest pain, shortness of breath, weakness, vomiting, and diarrhea, abdominal pain, headache or visual disturbance.  Past Medical History  Diagnosis Date  . Hypothyroid   . Allergy     seasonal  . Scoliosis   . Anxiety     Past Surgical History  Procedure Date  . Nasal sinus surgery   . Neck lesion biopsy     cysts benign  . Umbilical hernia repair   . Appendectomy   . Tubal ligation     Family History  Problem Relation Age of Onset  . Cancer Mother     colon ca  . Heart disease Mother   . Colon cancer Mother   . Cancer Father     History  Substance Use Topics  . Smoking status: Never Smoker   . Smokeless tobacco: Never Used  . Alcohol Use: No    OB History    Grav Para Term Preterm Abortions TAB SAB Ect Mult Living                  Review of Systems All pertinent positives and negatives reviewed in the history of present illness Allergies  Clindamycin/lincomycin cross reactors and Codeine  Home Medications   Current Outpatient Rx  Name Route Sig Dispense Refill  . ALPRAZOLAM 0.5 MG PO TABS       . GOODY HEADACHE PO Oral Take 1 packet by mouth daily as needed. For sinus headache     . DICLOFENAC SODIUM 1 % TD GEL Topical Apply 1 application topically 4 (four) times daily as needed. For pain      . FLUTICASONE PROPIONATE 50 MCG/ACT NA SUSP Nasal Place 1 spray into the nose daily as needed. For allergies     . LEVOTHYROXINE SODIUM 88 MCG PO TABS Oral Take 1 tablet (88 mcg total) by mouth daily. 90 tablet 3    BP 131/67  Pulse 63  Temp(Src) 97.7 F (36.5 C) (Oral)  Resp 18  SpO2 99%  Physical Exam  Constitutional: She is oriented to person, place, and time. She appears well-developed and well-nourished. No distress.  HENT:  Head: Normocephalic and atraumatic.  Eyes: Pupils are equal, round, and reactive to light.  Cardiovascular: Normal rate, regular rhythm and normal heart sounds.   Pulmonary/Chest: Effort normal and breath sounds normal. No respiratory distress. She has no wheezes. She has no rales.  Neurological: She is alert and oriented to person, place, and time.  Skin: Skin is warm and dry. No rash noted. No erythema.  Psychiatric: Her mood appears anxious.    ED Course  Procedures (including critical care time)   Labs Reviewed  POCT I-STAT, CHEM 8  I-STAT, CHEM 8   She will be referred back to her primary care Dr. for further evaluation of this  issue.  She has stable here in the emergency department.  We will give her Valium to see if this will improve her symptoms.  Told to return here for any worsening in her condition her electrolytes today were normal.  She does show any signs of significant illness at this time.       MDM  MDM Reviewed: nursing note, vitals and previous chart Interpretation: labs            Carlyle Dolly, PA-C 08/06/11 1240

## 2011-08-06 NOTE — ED Provider Notes (Signed)
Medical screening examination/treatment/procedure(s) were performed by non-physician practitioner and as supervising physician I was immediately available for consultation/collaboration.  Flint Melter, MD 08/06/11 [phone]

## 2011-08-13 ENCOUNTER — Ambulatory Visit (INDEPENDENT_AMBULATORY_CARE_PROVIDER_SITE_OTHER): Payer: Medicare PPO | Admitting: Internal Medicine

## 2011-08-13 ENCOUNTER — Encounter: Payer: Self-pay | Admitting: Internal Medicine

## 2011-08-13 DIAGNOSIS — R202 Paresthesia of skin: Secondary | ICD-10-CM

## 2011-08-13 DIAGNOSIS — E039 Hypothyroidism, unspecified: Secondary | ICD-10-CM

## 2011-08-13 DIAGNOSIS — R209 Unspecified disturbances of skin sensation: Secondary | ICD-10-CM

## 2011-08-13 DIAGNOSIS — I10 Essential (primary) hypertension: Secondary | ICD-10-CM

## 2011-08-13 LAB — COMPREHENSIVE METABOLIC PANEL
AST: 20 U/L (ref 0–37)
Alkaline Phosphatase: 79 U/L (ref 39–117)
BUN: 21 mg/dL (ref 6–23)
Calcium: 9.6 mg/dL (ref 8.4–10.5)
Creatinine, Ser: 1 mg/dL (ref 0.4–1.2)

## 2011-08-13 LAB — CBC WITH DIFFERENTIAL/PLATELET
Basophils Relative: 0.6 % (ref 0.0–3.0)
Eosinophils Relative: 1.4 % (ref 0.0–5.0)
HCT: 39.9 % (ref 36.0–46.0)
MCV: 90.6 fl (ref 78.0–100.0)
Monocytes Absolute: 0.5 10*3/uL (ref 0.1–1.0)
Monocytes Relative: 6.4 % (ref 3.0–12.0)
Neutrophils Relative %: 70.4 % (ref 43.0–77.0)
RBC: 4.4 Mil/uL (ref 3.87–5.11)
WBC: 7.6 10*3/uL (ref 4.5–10.5)

## 2011-08-13 LAB — SEDIMENTATION RATE: Sed Rate: 19 mm/hr (ref 0–22)

## 2011-08-13 MED ORDER — TRAMADOL HCL 50 MG PO TABS: 50.0000 mg | ORAL_TABLET | Freq: Three times a day (TID) | ORAL | Status: DC | PRN

## 2011-08-13 MED ORDER — GABAPENTIN 100 MG PO CAPS: 100.0000 mg | ORAL_CAPSULE | Freq: Three times a day (TID) | ORAL | Status: DC

## 2011-08-13 NOTE — Progress Notes (Signed)
  Subjective:    Patient ID: Julie Duffy, female    DOB: [birth year], 66 y.o.   MRN: [id number]  HPI  66 year old patient who was seen one month ago with a one month history of painful paresthesias involving primarily her arms;  she describes a severe burning sensation that was worse at night but is persistent throughout the day. She also has less severe paresthesias involving her upper inner thigh area and rare involvement of the face. The paresthesias do not extend to the hands. She has some associated insomnia. She also describes some involvement of the upper anterior chest. Denies any palpitations blood pressure swings or flushing. She was seen by cardiology recently. She has had a chest x-ray approximately 8 months ago   Review of Systems  Constitutional: Negative.   HENT: Negative for hearing loss, congestion, sore throat, rhinorrhea, dental problem, sinus pressure and tinnitus.   Eyes: Negative for pain, discharge and visual disturbance.  Respiratory: Negative for cough and shortness of breath.   Cardiovascular: Negative for chest pain, palpitations and leg swelling.  Gastrointestinal: Negative for nausea, vomiting, abdominal pain, diarrhea, constipation, blood in stool and abdominal distention.  Genitourinary: Negative for dysuria, urgency, frequency, hematuria, flank pain, vaginal bleeding, vaginal discharge, difficulty urinating, vaginal pain and pelvic pain.  Musculoskeletal: Negative for joint swelling, arthralgias and gait problem.  Skin: Negative for rash.  Neurological: Negative for dizziness, syncope, speech difficulty, weakness, numbness (painful paresthesias primarily of the arms) and headaches.  Hematological: Negative for adenopathy.  Psychiatric/Behavioral: Negative for behavioral problems, dysphoric mood and agitation. The patient is not nervous/anxious.        Objective:   Physical Exam  Constitutional: She is oriented to person, place, and time. She appears  well-developed and well-nourished.  HENT:  Head: Normocephalic.  Right Ear: External ear normal.  Left Ear: External ear normal.  Mouth/Throat: Oropharynx is clear and moist.  Eyes: Conjunctivae and EOM are normal. Pupils are equal, round, and reactive to light.  Neck: Normal range of motion. Neck supple. No thyromegaly present.  Cardiovascular: Normal rate, regular rhythm, normal heart sounds and intact distal pulses.   Pulmonary/Chest: Effort normal and breath sounds normal.  Abdominal: Soft. Bowel sounds are normal. She exhibits no mass. There is no tenderness.  Musculoskeletal: Normal range of motion.  Lymphadenopathy:    She has no cervical adenopathy.  Neurological: She is alert and oriented to person, place, and time. She has normal reflexes.       Reflexes are brisk and equal Motor strength normal   Skin: Skin is warm and dry. No rash noted.  Psychiatric: She has a normal mood and affect. Her behavior is normal.          Assessment & Plan:   Paresthesias of the arms with lesser involvement of the legs anterior chest and some facial involvement. We'll check screening lab including a sedimentation rate. We'll check nerve conduction studies.

## 2011-08-13 NOTE — Patient Instructions (Signed)
Return in one month for followup  Call or return to clinic prn if these symptoms worsen or fail to improve as anticipated.  

## 2011-08-19 ENCOUNTER — Telehealth: Payer: Self-pay | Admitting: Internal Medicine

## 2011-08-19 NOTE — Telephone Encounter (Signed)
Pt called req to get lab results.  °

## 2011-08-19 NOTE — Telephone Encounter (Signed)
Spoke with pt - labs all normal - still having 'burning ' feeling on face throughout the day - just started taking gabapentin 300mg  - will call back Thursday if no better - kik

## 2011-08-22 ENCOUNTER — Encounter: Payer: Self-pay | Admitting: Internal Medicine

## 2011-08-22 ENCOUNTER — Ambulatory Visit (INDEPENDENT_AMBULATORY_CARE_PROVIDER_SITE_OTHER): Payer: Medicare PPO | Admitting: Internal Medicine

## 2011-08-22 VITALS — BP 120/80 | Temp 98.2°F | Wt 140.0 lb

## 2011-08-22 DIAGNOSIS — R200 Anesthesia of skin: Secondary | ICD-10-CM

## 2011-08-22 DIAGNOSIS — R209 Unspecified disturbances of skin sensation: Secondary | ICD-10-CM

## 2011-08-22 NOTE — Patient Instructions (Addendum)
Nerve conduction studies as discussed  Call if any clinical worseningParesthesia Paresthesia is a burning or prinkling feeling (sensation). It may occur in any part of the body, but is usually felt in the hands, arms, legs or feet. GET HELP RIGHT AWAY IF:    You feel weak, have trouble walking, or can hardly move an arm.     You have problems with speech or vision.     You cannot control when you poop (bowel movement) or pee (urinate).     You feel numbness after an injury.     You lost consciousness, even if it was only for a short time.     The burning or prickling feeling gets worse when walking.     You have pain, cramps, or dizziness.     There is a rash you cannot explain.  MAKE SURE YOU:    Understand these instructions.     Will watch this condition.     Will get help right away if you are not doing well or get worse.  Document Released: 07/04/2008 Document Revised: 04/03/2011 Document Reviewed: 07/04/2008 [facility] Patient Information 2012 [place], [place].

## 2011-08-22 NOTE — Progress Notes (Signed)
  Subjective:    Patient ID: Julie Duffy, female    DOB: [birth year], 66 y.o.   MRN: [id number]  HPI  66 year old patient who is seen today for followup of her paresthesias;  she was seen 9 days ago and placed on Neurontin. The paresthesias of the arms are improved she describes more of a pruritic quality and the burning quality has largely resolved although she is still having some significant burning dysesthesia involving the anterior chest. She continues to have less severe sensation involving her anterior and medial thighs. No new  Symptoms.  Laboratory screening including sed rate normal TSH now normal. Conduction studies were ordered but have not been completed She states that she has a history of significant scoliosis   Review of Systems  Constitutional: Negative.   HENT: Negative for hearing loss, congestion, sore throat, rhinorrhea, dental problem, sinus pressure and tinnitus.   Eyes: Negative for pain, discharge and visual disturbance.  Respiratory: Negative for cough and shortness of breath.   Cardiovascular: Negative for chest pain, palpitations and leg swelling.  Gastrointestinal: Negative for nausea, vomiting, abdominal pain, diarrhea, constipation, blood in stool and abdominal distention.  Genitourinary: Negative for dysuria, urgency, frequency, hematuria, flank pain, vaginal bleeding, vaginal discharge, difficulty urinating, vaginal pain and pelvic pain.  Musculoskeletal: Negative for joint swelling, arthralgias and gait problem.  Skin: Negative for rash.  Neurological: Positive for numbness (dysesthesias as described in the history of present illness). Negative for dizziness, syncope, speech difficulty, weakness and headaches.  Hematological: Negative for adenopathy.  Psychiatric/Behavioral: Negative for behavioral problems, dysphoric mood and agitation. The patient is not nervous/anxious.        Objective:   Physical Exam  Constitutional: She is oriented to person, place,  and time. She appears well-developed and well-nourished. No distress.  Neurological: She is alert and oriented to person, place, and time. She has normal reflexes. She displays normal reflexes. No cranial nerve deficit. She exhibits normal muscle tone. Coordination normal.       Motor strength normal Reflexes remain brisk and symmetrical          Assessment & Plan:   Paresthesias of the arms and legs as well as anterior chest. We'll expedite the nerve conduction studies. Continue Neurontin at the present dose which has been quite helpful. Consider neurology evaluation History of scoliosis

## 2011-09-01 ENCOUNTER — Other Ambulatory Visit: Payer: Self-pay | Admitting: Internal Medicine

## 2011-09-02 ENCOUNTER — Encounter: Payer: Self-pay | Admitting: Internal Medicine

## 2011-09-02 ENCOUNTER — Ambulatory Visit: Payer: Medicare PPO | Admitting: Internal Medicine

## 2011-09-02 ENCOUNTER — Ambulatory Visit (INDEPENDENT_AMBULATORY_CARE_PROVIDER_SITE_OTHER): Payer: Medicare PPO | Admitting: Internal Medicine

## 2011-09-02 DIAGNOSIS — M79609 Pain in unspecified limb: Secondary | ICD-10-CM | POA: Insufficient documentation

## 2011-09-02 DIAGNOSIS — R202 Paresthesia of skin: Secondary | ICD-10-CM

## 2011-09-02 DIAGNOSIS — I10 Essential (primary) hypertension: Secondary | ICD-10-CM

## 2011-09-02 DIAGNOSIS — R209 Unspecified disturbances of skin sensation: Secondary | ICD-10-CM

## 2011-09-02 DIAGNOSIS — E039 Hypothyroidism, unspecified: Secondary | ICD-10-CM

## 2011-09-02 NOTE — Patient Instructions (Signed)
Call or return to clinic prn if these symptoms worsen or fail to improve as anticipated.

## 2011-09-02 NOTE — Progress Notes (Signed)
  Subjective:    Patient ID: Julie Duffy, female    DOB: 04-16-1946, 66 y.o.   MRN: [id number]  HPI  66 year old patient who is seen today for followup of paresthesias of her arms and legs. She did have nerve conduction studies performed that were normal;  however unfortunately EMGs were not performed.  Her symptoms have changed and are much improved she describes some stiffness in the neck area and a cold sensations involving both arms and shoulder regions but sparing the hands. Prior she had the severe painful burning-type dysesthesias. Her present symptoms are much less severe and more tolerable. She has no further lower extremity symptoms. She has been told by a chiropractor and also from the results of a chest x-ray that she has significant scoliosis    Review of Systems  Constitutional: Negative.   HENT: Negative for hearing loss, congestion, sore throat, rhinorrhea, dental problem, sinus pressure and tinnitus.   Eyes: Negative for pain, discharge and visual disturbance.  Respiratory: Negative for cough and shortness of breath.   Cardiovascular: Negative for chest pain, palpitations and leg swelling.  Gastrointestinal: Negative for nausea, vomiting, abdominal pain, diarrhea, constipation, blood in stool and abdominal distention.  Genitourinary: Negative for dysuria, urgency, frequency, hematuria, flank pain, vaginal bleeding, vaginal discharge, difficulty urinating, vaginal pain and pelvic pain.  Musculoskeletal: Negative for joint swelling, arthralgias and gait problem.  Skin: Negative for rash.  Neurological: Positive for numbness. Negative for dizziness, syncope, speech difficulty, weakness and headaches.  Hematological: Negative for adenopathy.  Psychiatric/Behavioral: Negative for behavioral problems, dysphoric mood and agitation. The patient is not nervous/anxious.        Objective:   Physical Exam  Constitutional: She is oriented to person, place, and time. She appears  well-developed and well-nourished. No distress.  HENT:  Head: Normocephalic.  Right Ear: External ear normal.  Left Ear: External ear normal.  Mouth/Throat: Oropharynx is clear and moist.  Eyes: Conjunctivae and EOM are normal. Pupils are equal, round, and reactive to light.  Neck: Normal range of motion. Neck supple. No thyromegaly present.  Cardiovascular: Normal rate, regular rhythm, normal heart sounds and intact distal pulses.   Pulmonary/Chest: Effort normal and breath sounds normal.  Abdominal: Soft. Bowel sounds are normal. She exhibits no mass. There is no tenderness.  Musculoskeletal: Normal range of motion.  Lymphadenopathy:    She has no cervical adenopathy.  Neurological: She is alert and oriented to person, place, and time. She has normal reflexes. She displays normal reflexes. No cranial nerve deficit. Coordination normal.       Motor strength in her upper extremities are normal Reflexes are brisk and equal  Skin: Skin is warm and dry. No rash noted.  Psychiatric: She has a normal mood and affect. Her behavior is normal.          Assessment & Plan:   Paresthesias improved. Options were discussed including cervical MRI EMG and possibly neurology referral. She is much improved and will defer further studies unless symptoms fail to resolve or there are new symptoms

## 2011-09-02 NOTE — Telephone Encounter (Signed)
ok 

## 2011-09-02 NOTE — Telephone Encounter (Signed)
Pt last seen 09/02/11. Pls advise.

## 2011-09-03 NOTE — Telephone Encounter (Signed)
ok 

## 2011-09-09 ENCOUNTER — Telehealth: Payer: Self-pay | Admitting: *Deleted

## 2011-09-09 DIAGNOSIS — R202 Paresthesia of skin: Secondary | ICD-10-CM

## 2011-09-09 NOTE — Telephone Encounter (Signed)
Pt ask if tests have been ordered

## 2011-09-11 NOTE — Telephone Encounter (Signed)
Referral ordered

## 2011-09-11 NOTE — Telephone Encounter (Signed)
Please schedule neurology referral due to the paresthesias

## 2011-09-11 NOTE — Telephone Encounter (Signed)
Pt called and request to have a referral to neurology.  Pls advise.

## 2011-09-12 ENCOUNTER — Ambulatory Visit: Payer: Medicare PPO | Admitting: Internal Medicine

## 2011-09-18 ENCOUNTER — Other Ambulatory Visit: Payer: Self-pay | Admitting: Internal Medicine

## 2011-09-24 ENCOUNTER — Telehealth: Payer: Self-pay | Admitting: *Deleted

## 2011-09-24 NOTE — Telephone Encounter (Signed)
Spoke with pt- about med dose to be on   Terri : has this referral been done/scheduled?

## 2011-09-24 NOTE — Telephone Encounter (Signed)
Pt was on - last rx fill 09/2011 Labs drawn in Jan WNL -  Please advise

## 2011-09-24 NOTE — Telephone Encounter (Signed)
Please continue original thyroid dose of 0.88 mg daily. Has an appointment with Dr. Modesto Charon been scheduled?  If not please schedule for evaluation of neuropathy

## 2011-09-24 NOTE — Telephone Encounter (Signed)
Pt is asking whether she has an appt made for a neurololgist that Dr. Renette Butters was discussing with her, and she is confused which Levothyroxine she is supposed to be taking???  Please call.

## 2011-09-25 NOTE — Telephone Encounter (Signed)
Left message on patient's voicemail this morning to schedule the appointment. There were two referrals in the system, one of which had been deferred. Still waiting to hear back from patient.

## 2011-09-25 NOTE — Telephone Encounter (Signed)
Good Morning -   Can you please check the status of this referral that was sent to your WQ on 09/12/11? I did not see any attempts to contact pt.  Please let me know.  Thanks.

## 2011-10-02 ENCOUNTER — Encounter: Payer: Self-pay | Admitting: Neurology

## 2011-11-22 ENCOUNTER — Other Ambulatory Visit (INDEPENDENT_AMBULATORY_CARE_PROVIDER_SITE_OTHER): Payer: Medicare PPO

## 2011-11-22 ENCOUNTER — Ambulatory Visit (INDEPENDENT_AMBULATORY_CARE_PROVIDER_SITE_OTHER): Payer: Medicare PPO | Admitting: Neurology

## 2011-11-22 ENCOUNTER — Encounter: Payer: Self-pay | Admitting: Neurology

## 2011-11-22 VITALS — BP 122/72 | HR 72 | Wt 132.0 lb

## 2011-11-22 DIAGNOSIS — R200 Anesthesia of skin: Secondary | ICD-10-CM

## 2011-11-22 DIAGNOSIS — R209 Unspecified disturbances of skin sensation: Secondary | ICD-10-CM

## 2011-11-22 NOTE — Progress Notes (Signed)
Dear Dr. Amador Cunas,  Thank you for having me see Julie Duffy in consultation today at [facility] for her problem with intermittent burning and tingling in her arms.  As you may recall, she is a 66 y.o. year old female with a history of hypothyroidism and vitiligo who began developing burning sensation in her arms radiating to the neck.  It did not involve her hands.  It was symmetric.  It was unrelenting when it was at its worse in December, although had started a month before.  It was improved with chiropractic neck manipulation.  It is now intermittent.  She denies numbness although does say clothing used to irritate the skin.    She also had intermittent burning in her bilateral thighs around the same time.  She was tried on gabapentin, but she did not like the way this made her feel.  She takes Xanax to help her sleep if the pain gets bad at night.  She has had no change in her bladder function and no difficulty walking.  She denies sensation abnormalities in her feet.   Past Medical History  Diagnosis Date  . Hypothyroid   . Allergy     seasonal  . Scoliosis   . Anxiety     Past Surgical History  Procedure Date  . Nasal sinus surgery   . Neck lesion biopsy     cysts benign  . Umbilical hernia repair   . Appendectomy   . Tubal ligation     History   Social History  . Marital Status: Divorced    Spouse Name: N/A    Number of Children: 2  . Years of Education: N/A   Occupational History  . CMA    Social History Main Topics  . Smoking status: Never Smoker   . Smokeless tobacco: Never Used  . Alcohol Use: No  . Drug Use: No  . Sexually Active: None   Other Topics Concern  . None   Social History Narrative  . None    Family History  Problem Relation Age of Onset  . Cancer Mother     colon ca  . Heart disease Mother   . Colon cancer Mother   . Cancer Father   - sister has lupus.  Current Outpatient Prescriptions on File Prior to Visit    Medication Sig Dispense Refill  . ALPRAZolam (XANAX) 0.5 MG tablet TAKE 1/2 TO 1 TABLET BY MOUTH TWICE DAILY AS NEEDED  50 tablet  0  . Aspirin-Acetaminophen-Caffeine (GOODY HEADACHE PO) Take 1 packet by mouth daily as needed. For sinus headache       . fluticasone (FLONASE) 50 MCG/ACT nasal spray Place 1 spray into the nose daily as needed. For allergies       . levothyroxine (SYNTHROID) 88 MCG tablet Take 1 tablet (88 mcg total) by mouth daily.  90 tablet  3  . traMADol (ULTRAM) 50 MG tablet Take 1 tablet (50 mg total) by mouth every 8 (eight) hours as needed for pain.  30 tablet  0  . DISCONTD: levothyroxine (SYNTHROID, LEVOTHROID) 75 MCG tablet TAKE ONE TABLET BY MOUTH EVERY DAY  90 tablet  1    Allergies  Allergen Reactions  . Clindamycin/Lincomycin Cross Reactors Other (See Comments)    GI intolerance  . Codeine Other (See Comments)    hallucinations      ROS:  13 systems were reviewed and are notable for no fevers, chills, weight loss..  All other review  of systems are unremarkable.   Examination:  Filed Vitals:   11/22/11 0840  BP: 122/72  Pulse: 72  Weight: 132 lb (59.875 kg)     In general, well appearing women.  Cardiovascular: The patient has a regular rate and rhythm and no carotid bruits.  Fundoscopy:  Disks are flat. Vessel caliber within normal limits.  Mental status:   The patient is oriented to person, place and time. Recent and remote memory are intact. Attention span and concentration are normal. Language including repetition, naming, following commands are intact. Fund of knowledge of current and historical events, as well as vocabulary are normal.  Cranial Nerves: Pupils are equally round and reactive to light. Visual fields full to confrontation. Extraocular movements are intact without nystagmus. Facial sensation and muscles of mastication are intact. Muscles of facial expression are symmetric. Hearing intact to bilateral finger rub. Tongue  protrusion, uvula, palate midline.  Shoulder shrug intact  Motor:  The patient has normal bulk and tone, no pronator drift.  There are no adventitious movements.  5/5 muscle strength bilaterally.  Reflexes:   Biceps  Triceps Brachioradialis Knee Ankle  Right 3+  3+  3+   3+ 1+  Left  3+  3+  3+   3+ 1+  Toes down  Coordination:  Normal finger to nose.  No dysdiadokinesia.  Sensation is intact to temperature and vibration as well as pin prick in arms.  Gait and Station are normal.   Romberg is negative.  NCS of bilateral UE were normal.   Impression/Recs: Dysethesias of bilateral upper extremities that have improved.  At first I thought that it was possible this was due to bilateral cervical radiculopathies - although admittedly that would be unusual.  However, this does not at all explain the sensation in her legs.  Her reflexes are slightly brisk, making cervical stenosis possible, but this seems unlikely given it should not get better.  In any case, I am heartened by the improvement.  For now I am going to send a B12 + MMA, CRP, ANA, RF given her concern for auto-immune diseases.  I expect these to be normal.  If she develops worsening symptoms I would start with a repeat NCS/EMG and cervical spine MRI.  She will let me know if the symptoms worsen.  We will see the patient back on an as needed basis.  Thank you for having [place] see Julie Duffy in consultation.  Feel free to contact me with any questions.  Julie Raider Modesto Charon, MD [facility], [place] [address] [place], [place] [address] Phone: [phone] Fax: [phone].

## 2011-11-22 NOTE — Patient Instructions (Signed)
Go to the basement to have your labs drawn today.  We will call you with your lab results.  Call us with any questions or concerns.

## 2011-11-23 LAB — RHEUMATOID FACTOR: Rhuematoid fact SerPl-aCnc: <10 IU/mL (ref <=14)

## 2011-11-25 LAB — ANTI-NUCLEAR AB-TITER (ANA TITER): ANA Titer 1: NEGATIVE

## 2011-11-25 LAB — ANA: Anti Nuclear Antibody(ANA): POSITIVE — AB

## 2012-01-02 ENCOUNTER — Other Ambulatory Visit: Payer: Self-pay

## 2012-01-02 MED ORDER — ALPRAZOLAM 0.5 MG PO TABS: 0.5000 mg | ORAL_TABLET | Freq: Two times a day (BID) | ORAL | Status: DC | PRN

## 2012-04-08 ENCOUNTER — Other Ambulatory Visit: Payer: Self-pay

## 2012-04-08 MED ORDER — ALPRAZOLAM 0.5 MG PO TABS: 0.5000 mg | ORAL_TABLET | Freq: Two times a day (BID) | ORAL | Status: DC | PRN

## 2012-04-24 ENCOUNTER — Ambulatory Visit (INDEPENDENT_AMBULATORY_CARE_PROVIDER_SITE_OTHER): Payer: Medicare PPO | Admitting: Internal Medicine

## 2012-04-24 ENCOUNTER — Encounter: Payer: Self-pay | Admitting: Internal Medicine

## 2012-04-24 VITALS — BP 110/78 | Temp 98.2°F | Wt 134.0 lb

## 2012-04-24 DIAGNOSIS — I10 Essential (primary) hypertension: Secondary | ICD-10-CM

## 2012-04-24 DIAGNOSIS — E039 Hypothyroidism, unspecified: Secondary | ICD-10-CM

## 2012-04-24 MED ORDER — LEVOTHYROXINE SODIUM 88 MCG PO TABS: 88.0000 ug | ORAL_TABLET | Freq: Every day | ORAL | Status: DC

## 2012-04-24 MED ORDER — ALPRAZOLAM 0.5 MG PO TABS: 0.5000 mg | ORAL_TABLET | Freq: Two times a day (BID) | ORAL | Status: DC | PRN

## 2012-04-24 NOTE — Patient Instructions (Signed)
Limit your sodium (Salt) intake    It is important that you exercise regularly, at least 20 minutes 3 to 4 times per week.  If you develop chest pain or shortness of breath seek  medical attention.  Return in 6 months for follow-up  

## 2012-04-24 NOTE — Progress Notes (Signed)
Subjective:    Patient ID: Julie Duffy, female    DOB: 1945/11/15, 66 y.o.   MRN: [id number]  HPI 66 year old patient who is seen today for followup. She has a history of hypothyroidism. She also has a history of hypertension presently controlled off medication. She continues to have some paresthesias of the extremities but this am is very mild and has not progressed. She did have a neurological evaluation in the spring.  Past Medical History  Diagnosis Date  . Hypothyroid   . Allergy     seasonal  . Scoliosis   . Anxiety     History   Social History  . Marital Status: Divorced    Spouse Name: N/A    Number of Children: 2  . Years of Education: N/A   Occupational History  . CMA    Social History Main Topics  . Smoking status: Never Smoker   . Smokeless tobacco: Never Used  . Alcohol Use: No  . Drug Use: No  . Sexually Active: Not on file   Other Topics Concern  . Not on file   Social History Narrative  . No narrative on file    Past Surgical History  Procedure Date  . Nasal sinus surgery   . Neck lesion biopsy     cysts benign  . Umbilical hernia repair   . Appendectomy   . Tubal ligation     Family History  Problem Relation Age of Onset  . Cancer Mother     colon ca  . Heart disease Mother   . Colon cancer Mother   . Cancer Father     Allergies  Allergen Reactions  . Clindamycin/Lincomycin Cross Reactors Other (See Comments)    GI intolerance  . Codeine Other (See Comments)    hallucinations    Current Outpatient Prescriptions on File Prior to Visit  Medication Sig Dispense Refill  . ALPRAZolam (XANAX) 0.5 MG tablet Take 1 tablet (0.5 mg total) by mouth 2 (two) times daily as needed for sleep.  20 tablet  0  . Aspirin-Acetaminophen-Caffeine (GOODY HEADACHE PO) Take 1 packet by mouth daily as needed. For sinus headache       . DISCONTD: levothyroxine (SYNTHROID) 88 MCG tablet Take 1 tablet (88 mcg total) by mouth daily.  90 tablet  3  .  fluticasone (FLONASE) 50 MCG/ACT nasal spray Place 1 spray into the nose daily as needed. For allergies       . DISCONTD: traMADol (ULTRAM) 50 MG tablet Take 1 tablet (50 mg total) by mouth every 8 (eight) hours as needed for pain.  30 tablet  0    BP 110/78  Temp 98.2 F (36.8 C) (Oral)  Wt 134 lb (60.782 kg)      .    Review of Systems  Constitutional: Negative.   HENT: Negative for hearing loss, congestion, sore throat, rhinorrhea, dental problem, sinus pressure and tinnitus.   Eyes: Negative for pain, discharge and visual disturbance.  Respiratory: Negative for cough and shortness of breath.   Cardiovascular: Negative for chest pain, palpitations and leg swelling.  Gastrointestinal: Negative for nausea, vomiting, abdominal pain, diarrhea, constipation, blood in stool and abdominal distention.  Genitourinary: Negative for dysuria, urgency, frequency, hematuria, flank pain, vaginal bleeding, vaginal discharge, difficulty urinating, vaginal pain and pelvic pain.  Musculoskeletal: Negative for joint swelling, arthralgias and gait problem.  Skin: Negative for rash.  Neurological: Positive for numbness. Negative for dizziness, syncope, speech difficulty, weakness and headaches.  Hematological: Negative  for adenopathy.  Psychiatric/Behavioral: Negative for behavioral problems, dysphoric mood and agitation. The patient is not nervous/anxious.        Objective:   Physical Exam  Constitutional: She is oriented to person, place, and time. She appears well-developed and well-nourished.  HENT:  Head: Normocephalic.  Right Ear: External ear normal.  Left Ear: External ear normal.  Mouth/Throat: Oropharynx is clear and moist.  Eyes: Conjunctivae normal and EOM are normal. Pupils are equal, round, and reactive to light.  Neck: Normal range of motion. Neck supple. No thyromegaly present.  Cardiovascular: Normal rate, regular rhythm, normal heart sounds and intact distal pulses.     Pulmonary/Chest: Effort normal and breath sounds normal.  Abdominal: Soft. Bowel sounds are normal. She exhibits no mass. There is no tenderness.  Musculoskeletal: Normal range of motion.  Lymphadenopathy:    She has no cervical adenopathy.  Neurological: She is alert and oriented to person, place, and time.  Skin: Skin is warm and dry. No rash noted.  Psychiatric: She has a normal mood and affect. Her behavior is normal.          Assessment & Plan:   Hypothyroidism. We'll check a TSH Paresthesias stable  Recheck 6 months to

## 2012-09-12 ENCOUNTER — Emergency Department (HOSPITAL_COMMUNITY): Payer: Medicare HMO

## 2012-09-12 ENCOUNTER — Emergency Department (HOSPITAL_COMMUNITY)
Admission: EM | Admit: 2012-09-12 | Discharge: 2012-09-12 | Disposition: A | Discharge status: Home / Self Care | Payer: Medicare HMO | Source: Home / Self Care | Attending: Emergency Medicine | Admitting: Emergency Medicine

## 2012-09-12 ENCOUNTER — Encounter (HOSPITAL_COMMUNITY): Payer: Self-pay

## 2012-09-12 DIAGNOSIS — F411 Generalized anxiety disorder: Secondary | ICD-10-CM | POA: Insufficient documentation

## 2012-09-12 DIAGNOSIS — Z9889 Other specified postprocedural states: Secondary | ICD-10-CM | POA: Insufficient documentation

## 2012-09-12 DIAGNOSIS — J3489 Other specified disorders of nose and nasal sinuses: Secondary | ICD-10-CM | POA: Insufficient documentation

## 2012-09-12 DIAGNOSIS — E039 Hypothyroidism, unspecified: Secondary | ICD-10-CM | POA: Insufficient documentation

## 2012-09-12 DIAGNOSIS — IMO0001 Reserved for inherently not codable concepts without codable children: Secondary | ICD-10-CM | POA: Insufficient documentation

## 2012-09-12 DIAGNOSIS — N390 Urinary tract infection, site not specified: Secondary | ICD-10-CM

## 2012-09-12 DIAGNOSIS — Z79899 Other long term (current) drug therapy: Secondary | ICD-10-CM | POA: Insufficient documentation

## 2012-09-12 DIAGNOSIS — Z8739 Personal history of other diseases of the musculoskeletal system and connective tissue: Secondary | ICD-10-CM | POA: Insufficient documentation

## 2012-09-12 DIAGNOSIS — R059 Cough, unspecified: Secondary | ICD-10-CM | POA: Insufficient documentation

## 2012-09-12 DIAGNOSIS — R05 Cough: Secondary | ICD-10-CM | POA: Insufficient documentation

## 2012-09-12 DIAGNOSIS — IMO0002 Reserved for concepts with insufficient information to code with codable children: Secondary | ICD-10-CM | POA: Insufficient documentation

## 2012-09-12 DIAGNOSIS — J069 Acute upper respiratory infection, unspecified: Secondary | ICD-10-CM | POA: Insufficient documentation

## 2012-09-12 LAB — CBC WITH DIFFERENTIAL/PLATELET
Basophils Absolute: 0 10*3/uL (ref 0.0–0.1)
Basophils Relative: 0 % (ref 0–1)
Eosinophils Absolute: 0 10*3/uL (ref 0.0–0.7)
Eosinophils Relative: 0 % (ref 0–5)
HCT: 38.2 % (ref 36.0–46.0)
Hemoglobin: 12.6 g/dL (ref 12.0–15.0)
Lymphocytes Relative: 16 % (ref 12–46)
Lymphs Abs: 0.9 10*3/uL (ref 0.7–4.0)
MCH: 29.3 pg (ref 26.0–34.0)
MCHC: 33 g/dL (ref 30.0–36.0)
MCV: 88.8 fL (ref 78.0–100.0)
Monocytes Absolute: 0.4 10*3/uL (ref 0.1–1.0)
Monocytes Relative: 7 % (ref 3–12)
Neutro Abs: 4.3 10*3/uL (ref 1.7–7.7)
Neutrophils Relative %: 77 % (ref 43–77)
Platelets: 188 10*3/uL (ref 150–400)
RBC: 4.3 MIL/uL (ref 3.87–5.11)
RDW: 13 % (ref 11.5–15.5)
WBC: 5.6 10*3/uL (ref 4.0–10.5)

## 2012-09-12 LAB — URINALYSIS, ROUTINE W REFLEX MICROSCOPIC
Bilirubin Urine: NEGATIVE
Glucose, UA: NEGATIVE mg/dL
Ketones, ur: NEGATIVE mg/dL
Nitrite: POSITIVE — AB
Protein, ur: NEGATIVE mg/dL
Specific Gravity, Urine: 1.018 (ref 1.005–1.030)
Urobilinogen, UA: 0.2 mg/dL (ref 0.0–1.0)
pH: 6.5 (ref 5.0–8.0)

## 2012-09-12 LAB — BASIC METABOLIC PANEL
BUN: 14 mg/dL (ref 6–23)
CO2: 27 mEq/L (ref 19–32)
Calcium: 8.3 mg/dL — ABNORMAL LOW (ref 8.4–10.5)
Chloride: 100 mEq/L (ref 96–112)
Creatinine, Ser: 0.98 mg/dL (ref 0.50–1.10)
GFR calc Af Amer: 68 mL/min — ABNORMAL LOW (ref >90)
GFR calc non Af Amer: 58 mL/min — ABNORMAL LOW (ref >90)
Glucose, Bld: 101 mg/dL — ABNORMAL HIGH (ref 70–99)
Potassium: 3.7 mEq/L (ref 3.5–5.1)
Sodium: 136 mEq/L (ref 135–145)

## 2012-09-12 LAB — URINE MICROSCOPIC-ADD ON

## 2012-09-12 LAB — RAPID STREP SCREEN (MED CTR MEBANE ONLY): Streptococcus, Group A Screen (Direct): NEGATIVE

## 2012-09-12 MED ORDER — GUAIFENESIN ER 1200 MG PO TB12: 1.0000 | ORAL_TABLET | Freq: Two times a day (BID) | ORAL | Status: DC

## 2012-09-12 MED ORDER — DOXYCYCLINE HYCLATE 100 MG PO CAPS: 100.0000 mg | ORAL_CAPSULE | Freq: Two times a day (BID) | ORAL | Status: DC

## 2012-09-12 MED ORDER — ALBUTEROL SULFATE HFA 108 (90 BASE) MCG/ACT IN AERS
2.0000 | INHALATION_SPRAY | RESPIRATORY_TRACT | Status: DC | PRN
  Filled 2012-09-12: qty 6.7

## 2012-09-12 MED ORDER — PROMETHAZINE-DM 6.25-15 MG/5ML PO SYRP: 5.0000 mL | ORAL_SOLUTION | Freq: Four times a day (QID) | ORAL | Status: DC | PRN

## 2012-09-12 NOTE — ED Provider Notes (Signed)
History     CSN: [id number]  Arrival date & time 09/12/12  0141   First MD Initiated Contact with Patient 09/12/12 [phone]      Chief Complaint  Patient presents with  . Sore Throat    (Consider location/radiation/quality/duration/timing/severity/associated sxs/prior treatment) HPI Patient presents to the ER with cough, nasal congestion, sore throat and myalgias for the last 5 days. The patient states that she has tried OTC medications without significant relief. The patient is a very health person who only take thyroid medications. The patient states that these did not seem to help. The patient states that she has not had any chest pain, SOB, vomiting, nausea, abdominal pain, headache, visual changes, weakness, dizziness, or dysuria. Past Medical History  Diagnosis Date  . Hypothyroid   . Allergy     seasonal  . Scoliosis   . Anxiety     Past Surgical History  Procedure Laterality Date  . Nasal sinus surgery    . Neck lesion biopsy      cysts benign  . Umbilical hernia repair    . Appendectomy    . Tubal ligation      Family History  Problem Relation Age of Onset  . Cancer Mother     colon ca  . Heart disease Mother   . Colon cancer Mother   . Cancer Father     History  Substance Use Topics  . Smoking status: Never Smoker   . Smokeless tobacco: Never Used  . Alcohol Use: No    OB History   Grav Para Term Preterm Abortions TAB SAB Ect Mult Living                  Review of Systems All other systems negative except as documented in the HPI. All pertinent positives and negatives as reviewed in the HPI.  Allergies  Clindamycin/lincomycin cross reactors and Codeine  Home Medications   Current Outpatient Rx  Name  Route  Sig  Dispense  Refill  . ALPRAZolam (XANAX) 0.5 MG tablet   Oral   Take 1 tablet (0.5 mg total) by mouth 2 (two) times daily as needed for sleep.   60 tablet   4     Needs to be seen - last seen 08/2011   .  Aspirin-Acetaminophen-Caffeine (GOODY HEADACHE PO)   Oral   Take 1 packet by mouth daily as needed. For sinus headache          . fluticasone (FLONASE) 50 MCG/ACT nasal spray   Nasal   Place 1 spray into the nose daily as needed. For allergies          . levothyroxine (SYNTHROID, LEVOTHROID) 88 MCG tablet   Oral   Take 1 tablet (88 mcg total) by mouth daily.   90 tablet   4     BP 117/59  Pulse 57  Temp(Src) 98 F (36.7 C) (Oral)  Resp 18  SpO2 97%  Physical Exam  Nursing note and vitals reviewed. Constitutional: She is oriented to person, place, and time. She appears well-developed and well-nourished.  HENT:  Head: Normocephalic and atraumatic.  Mouth/Throat: Oropharynx is clear and moist. No oropharyngeal exudate.  Eyes: Pupils are equal, round, and reactive to light.  Neck: Normal range of motion. Neck supple.  Cardiovascular: Normal rate, regular rhythm and normal heart sounds.  Exam reveals no gallop and no friction rub.   No murmur heard. Pulmonary/Chest: Effort normal and breath sounds normal. No respiratory distress. She has no  wheezes. She has no rales.  Abdominal: Soft. Bowel sounds are normal. She exhibits no distension. There is no tenderness. There is no rebound.  Neurological: She is alert and oriented to person, place, and time. She exhibits normal muscle tone. Coordination normal.  Skin: Skin is warm and dry. No rash noted.    ED Course  Procedures (including critical care time)  Labs Reviewed  BASIC METABOLIC PANEL - Abnormal; Notable for the following:    Glucose, Bld 101 (*)    Calcium 8.3 (*)    GFR calc non Af Amer 58 (*)    GFR calc Af Amer 68 (*)    All other components within normal limits  URINALYSIS, ROUTINE W REFLEX MICROSCOPIC - Abnormal; Notable for the following:    APPearance CLOUDY (*)    Hgb urine dipstick LARGE (*)    Nitrite POSITIVE (*)    Leukocytes, UA SMALL (*)    All other components within normal limits  URINE  MICROSCOPIC-ADD ON - Abnormal; Notable for the following:    Bacteria, UA MANY (*)    All other components within normal limits  RAPID STREP SCREEN  URINE CULTURE  CBC WITH DIFFERENTIAL   Dg Chest 2 View  09/12/2012  *RADIOLOGY REPORT*  Clinical Data: Shortness of breath.  Productive cough.  Fever.  CHEST - 2 VIEW  Comparison: None.  Findings: Normal heart size and pulmonary vascularity. 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 No focal consolidation or airspace disease.  No blunting of costophrenic angles.  No pneumothorax.  Mild thoracic curvature.  IMPRESSION: Emphysematous changes in the lungs.  No evidence of active pulmonary disease.   Original Report Authenticated By: Burman Nieves, M.D.      The patient has a URI and will treat for this and have her recheck with her PCP> Told to return here for any worsening in her condition. Increase her fluid intake. The patient is very well appearing for her age.    MDM  MDM Reviewed: nursing note and vitals Interpretation: labs, ECG and x-ray            Carlyle Dolly, PA-C 09/14/12 1601

## 2012-09-12 NOTE — ED Notes (Signed)
Pt c/o sore throat x2 days with fever 

## 2012-09-13 ENCOUNTER — Encounter (HOSPITAL_COMMUNITY): Payer: Self-pay | Admitting: *Deleted

## 2012-09-13 ENCOUNTER — Inpatient Hospital Stay (HOSPITAL_COMMUNITY)
Admission: EM | Admit: 2012-09-13 | Discharge: 2012-09-14 | DRG: 153 | Disposition: A | Discharge status: Home / Self Care | Payer: Medicare HMO | Attending: Internal Medicine | Admitting: Internal Medicine

## 2012-09-13 DIAGNOSIS — Z881 Allergy status to other antibiotic agents status: Secondary | ICD-10-CM

## 2012-09-13 DIAGNOSIS — Z79899 Other long term (current) drug therapy: Secondary | ICD-10-CM

## 2012-09-13 DIAGNOSIS — N390 Urinary tract infection, site not specified: Secondary | ICD-10-CM | POA: Diagnosis present

## 2012-09-13 DIAGNOSIS — E039 Hypothyroidism, unspecified: Secondary | ICD-10-CM | POA: Diagnosis present

## 2012-09-13 DIAGNOSIS — R112 Nausea with vomiting, unspecified: Secondary | ICD-10-CM

## 2012-09-13 DIAGNOSIS — M4120 Other idiopathic scoliosis, site unspecified: Secondary | ICD-10-CM | POA: Diagnosis present

## 2012-09-13 DIAGNOSIS — R51 Headache: Secondary | ICD-10-CM

## 2012-09-13 DIAGNOSIS — A498 Other bacterial infections of unspecified site: Secondary | ICD-10-CM | POA: Diagnosis present

## 2012-09-13 DIAGNOSIS — F411 Generalized anxiety disorder: Secondary | ICD-10-CM | POA: Diagnosis present

## 2012-09-13 DIAGNOSIS — J019 Acute sinusitis, unspecified: Secondary | ICD-10-CM

## 2012-09-13 DIAGNOSIS — R519 Headache, unspecified: Secondary | ICD-10-CM | POA: Diagnosis present

## 2012-09-13 DIAGNOSIS — B9789 Other viral agents as the cause of diseases classified elsewhere: Secondary | ICD-10-CM | POA: Diagnosis present

## 2012-09-13 DIAGNOSIS — J069 Acute upper respiratory infection, unspecified: Principal | ICD-10-CM | POA: Diagnosis present

## 2012-09-13 DIAGNOSIS — Z8249 Family history of ischemic heart disease and other diseases of the circulatory system: Secondary | ICD-10-CM

## 2012-09-13 LAB — CBC
Hemoglobin: 13.5 g/dL (ref 12.0–15.0)
MCH: 30.5 pg (ref 26.0–34.0)
Platelets: 173 10*3/uL (ref 150–400)
RBC: 4.43 MIL/uL (ref 3.87–5.11)
WBC: 7 10*3/uL (ref 4.0–10.5)

## 2012-09-13 LAB — POCT I-STAT, CHEM 8
BUN: 12 mg/dL (ref 6–23)
Chloride: 98 mEq/L (ref 96–112)
Potassium: 4 mEq/L (ref 3.5–5.1)
Sodium: 133 mEq/L — ABNORMAL LOW (ref 135–145)
TCO2: 27 mmol/L (ref 0–100)

## 2012-09-13 MED ORDER — FLUTICASONE PROPIONATE 50 MCG/ACT NA SUSP
1.0000 | Freq: Every day | NASAL | Status: DC | PRN
  Administered 2012-09-13: 1 via NASAL
  Filled 2012-09-13: qty 16

## 2012-09-13 MED ORDER — MORPHINE SULFATE 4 MG/ML IJ SOLN
4.0000 mg | Freq: Once | INTRAMUSCULAR | Status: AC
  Administered 2012-09-13: 4 mg via INTRAVENOUS
  Filled 2012-09-13: qty 1

## 2012-09-13 MED ORDER — GUAIFENESIN ER 600 MG PO TB12
1200.0000 mg | ORAL_TABLET | Freq: Two times a day (BID) | ORAL | Status: DC
  Administered 2012-09-13 (×2): 1200 mg via ORAL
  Filled 2012-09-13 (×4): qty 2

## 2012-09-13 MED ORDER — DEXTROSE 5 % IV SOLN
1.0000 g | Freq: Once | INTRAVENOUS | Status: AC
  Administered 2012-09-13: 1 g via INTRAVENOUS
  Filled 2012-09-13: qty 10

## 2012-09-13 MED ORDER — SODIUM CHLORIDE 0.9 % IV SOLN
INTRAVENOUS | Status: DC
  Administered 2012-09-13 (×2): via INTRAVENOUS

## 2012-09-13 MED ORDER — METOCLOPRAMIDE HCL 5 MG/ML IJ SOLN
10.0000 mg | Freq: Four times a day (QID) | INTRAMUSCULAR | Status: DC | PRN
  Administered 2012-09-13 – 2012-09-14 (×2): 10 mg via INTRAVENOUS
  Filled 2012-09-13 (×2): qty 2

## 2012-09-13 MED ORDER — PROMETHAZINE-DM 6.25-15 MG/5ML PO SYRP: 5.0000 mL | ORAL_SOLUTION | Freq: Four times a day (QID) | ORAL | Status: DC | PRN

## 2012-09-13 MED ORDER — ALPRAZOLAM 0.5 MG PO TABS: 0.5000 mg | ORAL_TABLET | Freq: Two times a day (BID) | ORAL | Status: DC | PRN

## 2012-09-13 MED ORDER — ONDANSETRON HCL 4 MG/2ML IJ SOLN
4.0000 mg | Freq: Four times a day (QID) | INTRAMUSCULAR | Status: DC | PRN
  Administered 2012-09-14: 4 mg via INTRAVENOUS
  Filled 2012-09-13: qty 2

## 2012-09-13 MED ORDER — DEXTROSE 5 % IV SOLN
1.0000 g | INTRAVENOUS | Status: DC
  Administered 2012-09-13: 1 g via INTRAVENOUS
  Filled 2012-09-13 (×2): qty 10

## 2012-09-13 MED ORDER — PANTOPRAZOLE SODIUM 40 MG IV SOLR
40.0000 mg | Freq: Once | INTRAVENOUS | Status: AC
  Administered 2012-09-13: 40 mg via INTRAVENOUS

## 2012-09-13 MED ORDER — HEPARIN SODIUM (PORCINE) 5000 UNIT/ML IJ SOLN
5000.0000 [IU] | Freq: Three times a day (TID) | INTRAMUSCULAR | Status: DC
  Administered 2012-09-13 – 2012-09-14 (×5): 5000 [IU] via SUBCUTANEOUS
  Filled 2012-09-13 (×7): qty 1

## 2012-09-13 MED ORDER — LEVOTHYROXINE SODIUM 88 MCG PO TABS
88.0000 ug | ORAL_TABLET | Freq: Every day | ORAL | Status: DC
  Administered 2012-09-13 – 2012-09-14 (×2): 88 ug via ORAL
  Filled 2012-09-13 (×4): qty 1

## 2012-09-13 MED ORDER — SODIUM CHLORIDE 0.9 % IV SOLN
INTRAVENOUS | Status: DC
  Administered 2012-09-13: 06:00:00 via INTRAVENOUS

## 2012-09-13 MED ORDER — ACETAMINOPHEN 325 MG PO TABS
650.0000 mg | ORAL_TABLET | Freq: Four times a day (QID) | ORAL | Status: DC | PRN
  Administered 2012-09-13 (×2): 650 mg via ORAL
  Filled 2012-09-13 (×2): qty 2

## 2012-09-13 MED ORDER — GUAIFENESIN-DM 100-10 MG/5ML PO SYRP
10.0000 mL | ORAL_SOLUTION | Freq: Four times a day (QID) | ORAL | Status: DC | PRN
  Filled 2012-09-13: qty 10

## 2012-09-13 MED ORDER — ONDANSETRON HCL 4 MG PO TABS: 4.0000 mg | ORAL_TABLET | Freq: Four times a day (QID) | ORAL | Status: DC | PRN

## 2012-09-13 MED ORDER — ONDANSETRON HCL 4 MG/2ML IJ SOLN
4.0000 mg | Freq: Once | INTRAMUSCULAR | Status: AC
  Administered 2012-09-13: 4 mg via INTRAVENOUS
  Filled 2012-09-13: qty 2

## 2012-09-13 NOTE — H&P (Signed)
[facility] History and Physical  MALYNDA SMOLINSKI WGN:[id number] DOB: [birth year] DOA: 09/13/2012  Referring physician: ED PCP: Rogelia Boga, MD  Specialists: None  Chief Complaint: URI, UTI, failed outpatient treatment  HPI: Julie Duffy is a 67 y.o. female who returns to the ED after attempting outpatient treatment of a viral URI and UTI.  Symptoms include fever, chills, nausea, vomiting, headache, congestion, dysuria.  Unfortunately due to her vomiting she was unable to keep PO meds down including doxycycline started in the ED yesterday for her UTI, as a result she returned to the ED today.  In the ED, repeat lab work was largely unchanged.  She was started on rocephin for UTI, zofran for N/V and hospitalist has been asked to admit.  Review of Systems: 12 systems reviewed and otherwise negative.  Past Medical History  Diagnosis Date  . Hypothyroid   . Allergy     seasonal  . Scoliosis   . Anxiety    Past Surgical History  Procedure Laterality Date  . Nasal sinus surgery    . Neck lesion biopsy      cysts benign  . Umbilical hernia repair    . Appendectomy    . Tubal ligation     Social History:  reports that she has never smoked. She has never used smokeless tobacco. She reports that she does not drink alcohol or use illicit drugs.   Allergies  Allergen Reactions  . Clindamycin/Lincomycin Cross Reactors Other (See Comments)    GI intolerance  . Codeine Other (See Comments)    hallucinations    Family History  Problem Relation Age of Onset  . Cancer Mother     colon ca  . Heart disease Mother   . Colon cancer Mother   . Cancer Father     Prior to Admission medications   Medication Sig Start Date End Date Taking? Authorizing Provider  ALPRAZolam [name]) 0.5 MG tablet Take 1 tablet (0.5 mg total) by mouth 2 (two) times daily as needed for sleep. 04/24/12  Yes Gordy Savers, MD  doxycycline (VIBRAMYCIN) 100 MG capsule Take 1 capsule (100 mg  total) by mouth 2 (two) times daily. 09/12/12  Yes Jamesetta Orleans Lawyer, PA-C  fluticasone (FLONASE) 50 MCG/ACT nasal spray Place 1 spray into the nose daily as needed. For allergies  01/02/11  Yes Gordy Savers, MD  Guaifenesin 1200 MG TB12 Take 1 tablet (1,200 mg total) by mouth 2 (two) times daily. 09/12/12  Yes Jamesetta Orleans Lawyer, PA-C  levothyroxine (SYNTHROID, LEVOTHROID) 88 MCG tablet Take 1 tablet (88 mcg total) by mouth daily. 04/24/12  Yes Gordy Savers, MD  promethazine-dextromethorphan (PROMETHAZINE-DM) 6.25-15 MG/5ML syrup Take 5 mLs by mouth 4 (four) times daily as needed for cough. 09/12/12  Yes Carlyle Dolly, PA-C   Physical Exam: Filed Vitals:   09/13/12 0154  BP: 125/66  Pulse: 67  Temp: 99 F (37.2 C)  TempSrc: Oral  Resp: 18  SpO2: 97%    General:  NAD, resting comfortably in bed Eyes: PEERLA EOMI ENT: mucous membranes moist Neck: supple w/o JVD Cardiovascular: RRR w/o MRG Respiratory: CTA B Abdomen: soft, nt, nd, bs+ Skin: no rash nor lesion Musculoskeletal: MAE, full ROM all 4 extremities Psychiatric: normal tone and affect Neurologic: AAOx3, grossly non-focal  Labs on Admission:  Basic Metabolic Panel:  Recent Labs Lab 09/12/12 0721 09/13/12 0219  NA 136 133*  K 3.7 4.0  CL 100 98  CO2 27  --  GLUCOSE 101* 121*  BUN 14 12  CREATININE 0.98 1.00  CALCIUM 8.3*  --    Liver Function Tests: No results found for this basename: AST, ALT, ALKPHOS, BILITOT, PROT, ALBUMIN,  in the last 168 hours No results found for this basename: LIPASE, AMYLASE,  in the last 168 hours No results found for this basename: AMMONIA,  in the last 168 hours CBC:  Recent Labs Lab 09/12/12 0721 09/13/12 0208 09/13/12 0219  WBC 5.6 7.0  --   NEUTROABS 4.3  --   --   HGB 12.6 13.5 13.6  HCT 38.2 39.0 40.0  MCV 88.8 88.0  --   PLT 188 173  --    Cardiac Enzymes: No results found for this basename: CKTOTAL, CKMB, CKMBINDEX, TROPONINI,  in the last 168  hours  BNP (last 3 results) No results found for this basename: PROBNP,  in the last 8760 hours CBG: No results found for this basename: GLUCAP,  in the last 168 hours  Radiological Exams on Admission: Dg Chest 2 View  09/12/2012  *RADIOLOGY REPORT*  Clinical Data: Shortness of breath.  Productive cough.  Fever.  CHEST - 2 VIEW  Comparison: None.  Findings: Normal heart size and pulmonary vascularity. 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 No focal consolidation or airspace disease.  No blunting of costophrenic angles.  No pneumothorax.  Mild thoracic curvature.  IMPRESSION: Emphysematous changes in the lungs.  No evidence of active pulmonary disease.   Original Report Authenticated By: Burman Nieves, M.D.     EKG: Independently reviewed.  Assessment/Plan Active Problems:   URI   Headache   UTI (lower urinary tract infection)   Nausea and vomiting   1. URI - continue symptom relief with decongestants while inpatient, no evidence of PNA on CXR nor physical exam. 2. Fever - tylenol PRN fever 3. UTI - rocephin 1gm q24h for her UTI, urine culture pending, will order a blood culture although patient is post ABX at this point. 4. N/V - zofran and if refractory reglan for nausea 5. Hypothyroidism - continue synthroid, if unable to tolerate PO then may need to change this to IV, but suspect patient will be able to tolerate PO with nausea control.    Code Status: Full Code (must indicate code status--if unknown or must be presumed, indicate so) Family Communication: No family in room (indicate person spoken with, if applicable, with phone number if by telephone) Disposition Plan: Admit to obs (indicate anticipated LOS)  Time spent: 50 min  Liadan Guizar M. [facility] Pager [phone]  If 7PM-7AM, please contact night-coverage [email or web address] Password [email or web address] 09/13/2012, 4:11 AM

## 2012-09-13 NOTE — Progress Notes (Signed)
Utilization Review Completed.Daxen Lanum T2/04/2013  

## 2012-09-13 NOTE — ED Provider Notes (Signed)
History     CSN: [id number]  Arrival date & time 09/13/12  0141   First MD Initiated Contact with Patient 09/13/12 [phone]      Chief Complaint  Patient presents with  . Emesis    (Consider location/radiation/quality/duration/timing/severity/associated sxs/prior treatment) HPI Hx per PT. Evaluated here yesterday for HA, N/V, cough, fevers and urinary frequency, was diagnosed with URI and UTI. She was given medications and discharged home. Today persistent symptoms unable to hold anything down.  No new pain or symptoms, no SOB or CP. Cough somewhat improved. MOD in severity. Symptoms initially started 3-4 days ago  Past Medical History  Diagnosis Date  . Hypothyroid   . Allergy     seasonal  . Scoliosis   . Anxiety     Past Surgical History  Procedure Laterality Date  . Nasal sinus surgery    . Neck lesion biopsy      cysts benign  . Umbilical hernia repair    . Appendectomy    . Tubal ligation      Family History  Problem Relation Age of Onset  . Cancer Mother     colon ca  . Heart disease Mother   . Colon cancer Mother   . Cancer Father     History  Substance Use Topics  . Smoking status: Never Smoker   . Smokeless tobacco: Never Used  . Alcohol Use: No    OB History   Grav Para Term Preterm Abortions TAB SAB Ect Mult Living                  Review of Systems  Constitutional: Positive for fever and chills.  HENT: Positive for congestion and sore throat. Negative for trouble swallowing, neck pain and neck stiffness.   Eyes: Negative for visual disturbance.  Respiratory: Negative for shortness of breath.   Cardiovascular: Negative for chest pain.  Gastrointestinal: Positive for nausea and vomiting. Negative for abdominal pain.  Genitourinary: Positive for urgency and frequency. Negative for flank pain.  Musculoskeletal: Negative for back pain.  Skin: Negative for rash.  Neurological: Positive for headaches. Negative for dizziness, syncope, speech  difficulty, weakness and numbness.  All other systems reviewed and are negative.    Allergies  Clindamycin/lincomycin cross reactors and Codeine  Home Medications   Current Outpatient Rx  Name  Route  Sig  Dispense  Refill  . ALPRAZolam (XANAX) 0.5 MG tablet   Oral   Take 1 tablet (0.5 mg total) by mouth 2 (two) times daily as needed for sleep.   60 tablet   4     Needs to be seen - last seen 08/2011   . doxycycline (VIBRAMYCIN) 100 MG capsule   Oral   Take 1 capsule (100 mg total) by mouth 2 (two) times daily.   20 capsule   0   . fluticasone (FLONASE) 50 MCG/ACT nasal spray   Nasal   Place 1 spray into the nose daily as needed. For allergies          . Guaifenesin 1200 MG TB12   Oral   Take 1 tablet (1,200 mg total) by mouth 2 (two) times daily.   20 each   0   . levothyroxine (SYNTHROID, LEVOTHROID) 88 MCG tablet   Oral   Take 1 tablet (88 mcg total) by mouth daily.   90 tablet   4   . promethazine-dextromethorphan (PROMETHAZINE-DM) 6.25-15 MG/5ML syrup   Oral   Take 5 mLs by mouth 4 (four)  times daily as needed for cough.   120 mL   0     BP 125/66  Pulse 67  Temp(Src) 99 F (37.2 C) (Oral)  Resp 18  SpO2 97%  Physical Exam  Constitutional: She is oriented to person, place, and time. She appears well-developed and well-nourished.  HENT:  Head: Normocephalic and atraumatic.  Nose: Nose normal.  Mouth/Throat: Oropharynx is clear and moist. No oropharyngeal exudate.  Eyes: Conjunctivae and EOM are normal. Pupils are equal, round, and reactive to light.  Neck: Trachea normal. Neck supple. No thyromegaly present.  Cardiovascular: Normal rate, regular rhythm, S1 normal, S2 normal and normal pulses.     No systolic murmur is present   No diastolic murmur is present  Pulses:      Radial pulses are 2+ on the right side, and 2+ on the left side.  Pulmonary/Chest: Effort normal and breath sounds normal. No stridor. She has no wheezes. She has no  rhonchi. She has no rales. She exhibits no tenderness.  Abdominal: Soft. Normal appearance and bowel sounds are normal. There is no tenderness. There is no rebound, no guarding, no CVA tenderness and negative Murphy's sign.  No CVAT  Musculoskeletal:  BLE:s Calves nontender, no cords or erythema, negative Homans sign  Lymphadenopathy:    She has no cervical adenopathy.  Neurological: She is alert and oriented to person, place, and time. She has normal strength. No cranial nerve deficit or sensory deficit. GCS eye subscore is 4. GCS verbal subscore is 5. GCS motor subscore is 6.  Skin: Skin is warm and dry. No rash noted. She is not diaphoretic.  Psychiatric: Her speech is normal.  Cooperative and appropriate    ED Course  Procedures (including critical care time)  Labs Reviewed  POCT I-STAT, CHEM 8 - Abnormal; Notable for the following:    Sodium 133 (*)    Glucose, Bld 121 (*)    Calcium, Ion 1.10 (*)    All other components within normal limits  CBC   Dg Chest 2 View  09/12/2012  *RADIOLOGY REPORT*  Clinical Data: Shortness of breath.  Productive cough.  Fever.  CHEST - 2 VIEW  Comparison: None.  Findings: Normal heart size and pulmonary vascularity. 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 No focal consolidation or airspace disease.  No blunting of costophrenic angles.  No pneumothorax.  Mild thoracic curvature.  IMPRESSION: Emphysematous changes in the lungs.  No evidence of active pulmonary disease.   Original Report Authenticated By: Burman Nieves, M.D.     IV fluids.  IV ABx MDM  URI/ UTI with fever and vomiting  Failed outpatient management  Labs as above  Old records - UA and CXr reviewed  MED consult and admit        Sunnie Nielsen, MD 09/13/12 [phone]

## 2012-09-13 NOTE — ED Notes (Signed)
Pt arrived via [facility] c/o Head congestion which is the same symptoms from yesterdays visit. Diagnosed with UTI. Given script for Doxy and mucinex, but started vomiting and can not keep meds down

## 2012-09-13 NOTE — Progress Notes (Signed)
[facility] PROGRESS NOTE  Julie Duffy:[id number] DOB: [birth year] DOA: 09/13/2012 PCP: Rogelia Boga, MD  Assessment/Plan: Active Problems:   URI   Headache   UTI (lower urinary tract infection)   Nausea and vomiting    URI - continue symptom relief with decongestants while inpatient, no evidence of PNA on CXR nor physical exam. Fever - tylenol PRN fever  UTI - rocephin 1gm q24h for her UTI, urine culture pending, will order a blood culture although patient is post ABX at this point. Continue Rocephin for another day and switched to ciprofloxacin after that N/V - zofran and if refractory reglan for nausea  Hypothyroidism - continue synthroid, if unable to tolerate PO then may need to change this to IV, but suspect patient will be able to tolerate PO with nausea control   Code Status: full Family Communication: family updated about patient's clinical progress Disposition Plan:  Anticipate discharge tomorrow   Brief narrative: Julie Duffy is a 67 y.o. female who returns to the ED after attempting outpatient treatment of a viral URI and UTI. Symptoms include fever, chills, nausea, vomiting, headache, congestion, dysuria. Unfortunately due to her vomiting she was unable to keep PO meds down including doxycycline started in the ED yesterday for her UTI, as a result she returned to the ED today.  In the ED, repeat lab work was largely unchanged. She was started on rocephin for UTI, zofran for N/V and hospitalist has been asked to admit   Consultants:  None  Procedures:  None  Antibiotics:  Rocephin -- 2/8  HPI/Subjective: Feeling better no complaints  Objective: Filed Vitals:   09/13/12 0154 09/13/12 0524  BP: 125/66 136/69  Pulse: 67 56  Temp: 99 F (37.2 C) 98.8 F (37.1 C)  TempSrc: Oral Oral  Resp: 18 18  Height:  5\' 4"  (1.626 m)  Weight:  61.689 kg (136 lb)  SpO2: 97% 96%   No intake or output data in the 24 hours ending 09/13/12  0938  Exam:  General: NAD, resting comfortably in bed  Eyes: PEERLA EOMI  ENT: mucous membranes moist  Neck: supple w/o JVD  Cardiovascular: RRR w/o MRG  Respiratory: CTA B  Abdomen: soft, nt, nd, bs+  Skin: no rash nor lesion  Musculoskeletal: MAE, full ROM all 4 extremities  Psychiatric: normal tone and affect  Neurologic: AAOx3, grossly non-focal    Data Reviewed: Basic Metabolic Panel:  Recent Labs Lab 09/12/12 0721 09/13/12 0219  NA 136 133*  K 3.7 4.0  CL 100 98  CO2 27  --   GLUCOSE 101* 121*  BUN 14 12  CREATININE 0.98 1.00  CALCIUM 8.3*  --     Liver Function Tests: No results found for this basename: AST, ALT, ALKPHOS, BILITOT, PROT, ALBUMIN,  in the last 168 hours No results found for this basename: LIPASE, AMYLASE,  in the last 168 hours No results found for this basename: AMMONIA,  in the last 168 hours  CBC:  Recent Labs Lab 09/12/12 0721 09/13/12 0208 09/13/12 0219  WBC 5.6 7.0  --   NEUTROABS 4.3  --   --   HGB 12.6 13.5 13.6  HCT 38.2 39.0 40.0  MCV 88.8 88.0  --   PLT 188 173  --     Cardiac Enzymes: No results found for this basename: CKTOTAL, CKMB, CKMBINDEX, TROPONINI,  in the last 168 hours BNP (last 3 results) No results found for this basename: PROBNP,  in the last 8760 hours  CBG: No results found for this basename: GLUCAP,  in the last 168 hours  Recent Results (from the past 240 hour(s))  RAPID STREP SCREEN     Status: None   Collection Time    09/12/12  2:11 AM      Result Value Range Status   Streptococcus, Group A Screen (Direct) NEGATIVE  NEGATIVE Final   Comment:            DUE TO INADEQUATE SENSITIVITY OF EIA     RAPID TESTS FOR GROUP A STREP (GAS)     IT IS RECOMMENDED THAT ALL NEGATIVE     RESULTS BE FOLLOWED BY A     GROUP A STREP PROBE.     Studies: Dg Chest 2 View  09/12/2012  *RADIOLOGY REPORT*  Clinical Data: Shortness of breath.  Productive cough.  Fever.  CHEST - 2 VIEW  Comparison: None.   Findings: Normal heart size and pulmonary vascularity. 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 No focal consolidation or airspace disease.  No blunting of costophrenic angles.  No pneumothorax.  Mild thoracic curvature.  IMPRESSION: Emphysematous changes in the lungs.  No evidence of active pulmonary disease.   Original Report Authenticated By: Burman Nieves, M.D.     Scheduled Meds: . cefTRIAXone (ROCEPHIN)  IV  1 g Intravenous Q24H  . guaiFENesin  1,200 mg Oral BID  . heparin  5,000 Units Subcutaneous Q8H  . levothyroxine  88 mcg Oral QAC breakfast   Continuous Infusions: . sodium chloride 125 mL/hr at 09/13/12 0214    Active Problems:   URI   Headache   UTI (lower urinary tract infection)   Nausea and vomiting    Time spent: 40 minutes   [facility]  [facility] Pager [phone]. If 8PM-8AM, please contact night-coverage at [email or web address], password [facility] 09/13/2012, 9:38 AM  LOS: 0 days

## 2012-09-14 LAB — URINE CULTURE: Colony Count: >=100000

## 2012-09-14 MED ORDER — CIPROFLOXACIN HCL 500 MG PO TABS: 500.0000 mg | ORAL_TABLET | Freq: Two times a day (BID) | ORAL | Status: DC

## 2012-09-14 MED ORDER — CIPROFLOXACIN HCL 500 MG PO TABS: 500.0000 mg | ORAL_TABLET | Freq: Two times a day (BID) | ORAL | Status: AC

## 2012-09-14 MED ORDER — GUAIFENESIN-DM 100-10 MG/5ML PO SYRP: 10.0000 mL | ORAL_SOLUTION | Freq: Four times a day (QID) | ORAL | Status: DC | PRN

## 2012-09-14 MED ORDER — CIPROFLOXACIN HCL 500 MG PO TABS
500.0000 mg | ORAL_TABLET | Freq: Two times a day (BID) | ORAL | Status: DC
  Administered 2012-09-14: 500 mg via ORAL
  Filled 2012-09-14 (×3): qty 1

## 2012-09-14 NOTE — Discharge Summary (Signed)
Physician Discharge Summary  DONNABELLE BLANCHARD MRN: [id number] DOB/AGE: [birth year] 68 y.o.  PCP: Rogelia Boga, MD   Admit date: 09/13/2012 Discharge date: 09/14/2012  Discharge Diagnoses:      URI   Headache   UTI (lower urinary tract infection)   Nausea and vomiting     Medication List    STOP taking these medications       doxycycline 100 MG capsule  Commonly known as:  VIBRAMYCIN      TAKE these medications       ciprofloxacin 500 MG tablet  Commonly known as:  CIPRO  Take 1 tablet (500 mg total) by mouth 2 (two) times daily.     fluticasone 50 MCG/ACT nasal spray  Commonly known as:  FLONASE  Place 1 spray into the nose daily as needed. For allergies     Guaifenesin 1200 MG Tb12  Take 1 tablet (1,200 mg total) by mouth 2 (two) times daily.     guaiFENesin-dextromethorphan 100-10 MG/5ML syrup  Commonly known as:  ROBITUSSIN DM  Take 10 mLs by mouth 4 (four) times daily as needed for cough.     levothyroxine 88 MCG tablet  Commonly known as:  SYNTHROID, LEVOTHROID  Take 1 tablet (88 mcg total) by mouth daily.     promethazine-dextromethorphan 6.25-15 MG/5ML syrup  Commonly known as:  PROMETHAZINE-DM  Take 5 mLs by mouth 4 (four) times daily as needed for cough.      ASK your doctor about these medications       ALPRAZolam 0.5 MG tablet  Commonly known as:  XANAX  Take 1 tablet (0.5 mg total) by mouth 2 (two) times daily as needed for sleep.        Discharge Condition: Stable Disposition: 01-Home or Self Care   Consults: None   Significant Diagnostic Studies: Dg Chest 2 View  09/12/2012  *RADIOLOGY REPORT*  Clinical Data: Shortness of breath.  Productive cough.  Fever.  CHEST - 2 VIEW  Comparison: None.  Findings: Normal heart size and pulmonary vascularity. 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 No focal consolidation or airspace disease.  No blunting of costophrenic angles.  No pneumothorax.  Mild  thoracic curvature.  IMPRESSION: Emphysematous changes in the lungs.  No evidence of active pulmonary disease.   Original Report Authenticated By: Burman Nieves, M.D.        Microbiology: Recent Results (from the past 240 hour(s))  RAPID STREP SCREEN     Status: None   Collection Time    09/12/12  2:11 AM      Result Value Range Status   Streptococcus, Group A Screen (Direct) NEGATIVE  NEGATIVE Final   Comment:            DUE TO INADEQUATE SENSITIVITY OF EIA     RAPID TESTS FOR GROUP A STREP (GAS)     IT IS RECOMMENDED THAT ALL NEGATIVE     RESULTS BE FOLLOWED BY A     GROUP A STREP PROBE.  URINE CULTURE     Status: None   Collection Time    09/12/12  7:27 AM      Result Value Range Status   Specimen Description URINE, CLEAN CATCH   Final   Special Requests NONE   Final   Culture  Setup Time 09/12/2012 15:16   Final   Colony Count >=100,000 COLONIES/ML   Final   Culture ESCHERICHIA COLI   Final   Report Status 09/14/2012  FINAL   Final   Organism ID, Bacteria ESCHERICHIA COLI   Final  CULTURE, BLOOD (ROUTINE X 2)     Status: None   Collection Time    09/13/12  4:30 AM      Result Value Range Status   Specimen Description BLOOD LEFT ARM   Final   Special Requests BOTTLES DRAWN AEROBIC AND ANAEROBIC 10CC EACH   Final   Culture  Setup Time 09/13/2012 13:55   Final   Culture     Final   Value:        BLOOD CULTURE RECEIVED NO GROWTH TO DATE CULTURE WILL BE HELD FOR 5 DAYS BEFORE ISSUING A FINAL NEGATIVE REPORT   Report Status PENDING   Incomplete  CULTURE, BLOOD (ROUTINE X 2)     Status: None   Collection Time    09/13/12  4:40 AM      Result Value Range Status   Specimen Description BLOOD LEFT HAND   Final   Special Requests BOTTLES DRAWN AEROBIC AND ANAEROBIC 10CC EACH   Final   Culture  Setup Time 09/13/2012 13:55   Final   Culture     Final   Value:        BLOOD CULTURE RECEIVED NO GROWTH TO DATE CULTURE WILL BE HELD FOR 5 DAYS BEFORE ISSUING A FINAL NEGATIVE REPORT    Report Status PENDING   Incomplete     Labs: Results for orders placed during the hospital encounter of 09/13/12 (from the past 48 hour(s))  CBC     Status: None   Collection Time    09/13/12  2:08 AM      Result Value Range   WBC 7.0  4.0 - 10.5 K/uL   RBC 4.43  3.87 - 5.11 MIL/uL   Hemoglobin 13.5  12.0 - 15.0 g/dL   HCT 40.9  81.1 - 91.4 %   MCV 88.0  78.0 - 100.0 fL   MCH 30.5  26.0 - 34.0 pg   MCHC 34.6  30.0 - 36.0 g/dL   RDW 78.2  95.6 - 21.3 %   Platelets 173  150 - 400 K/uL  POCT I-STAT, CHEM 8     Status: Abnormal   Collection Time    09/13/12  2:19 AM      Result Value Range   Sodium 133 (*) 135 - 145 mEq/L   Potassium 4.0  3.5 - 5.1 mEq/L   Chloride 98  96 - 112 mEq/L   BUN 12  6 - 23 mg/dL   Creatinine, Ser 0.86  0.50 - 1.10 mg/dL   Glucose, Bld 578 (*) 70 - 99 mg/dL   Calcium, Ion 4.69 (*) 1.13 - 1.30 mmol/L   TCO2 27  0 - 100 mmol/L   Hemoglobin 13.6  12.0 - 15.0 g/dL   HCT 62.9  52.8 - 41.3 %  CULTURE, BLOOD (ROUTINE X 2)     Status: None   Collection Time    09/13/12  4:30 AM      Result Value Range   Specimen Description BLOOD LEFT ARM     Special Requests BOTTLES DRAWN AEROBIC AND ANAEROBIC 10CC EACH     Culture  Setup Time 09/13/2012 13:55     Culture       Value:        BLOOD CULTURE RECEIVED NO GROWTH TO DATE CULTURE WILL BE HELD FOR 5 DAYS BEFORE ISSUING A FINAL NEGATIVE REPORT   Report Status PENDING  CULTURE, BLOOD (ROUTINE X 2)     Status: None   Collection Time    09/13/12  4:40 AM      Result Value Range   Specimen Description BLOOD LEFT HAND     Special Requests BOTTLES DRAWN AEROBIC AND ANAEROBIC 10CC EACH     Culture  Setup Time 09/13/2012 13:55     Culture       Value:        BLOOD CULTURE RECEIVED NO GROWTH TO DATE CULTURE WILL BE HELD FOR 5 DAYS BEFORE ISSUING A FINAL NEGATIVE REPORT   Report Status PENDING       HPI :Julie Duffy is a 67 y.o. female who returns to the ED after attempting outpatient treatment of a viral  URI and UTI. Symptoms include fever, chills, nausea, vomiting, headache, congestion, dysuria. Unfortunately due to her vomiting she was unable to keep PO meds down including doxycycline started in the ED yesterday for her UTI, as a result she returned to the ED today.  In the ED, repeat lab work was largely unchanged. She was started on rocephin for UTI, zofran for N/V and hospitalist has been asked to admit.   Hospital course #1 UTI with Rocephin, urine culture shows Escherichia coli which is pansensitive, will switch to ciprofloxacin for another 5 days #2 URI was on doxycycline prior to admission and peaked the patient has been switched to ciprofloxacin. She has a cough syrup ordered as well, chest x-ray was negative and shows Emphysematous changes  in the lungs  Hypothyroidism continue Synthroid  HOSPITAL COURSE:    Discharge Exam:   Blood pressure 128/66, pulse 57, temperature 100 F (37.8 C), temperature source Oral, resp. rate 16, height 5\' 4"  (1.626 m), weight 61.689 kg (136 lb), SpO2 96.00%. General: NAD, resting comfortably in bed  Eyes: PEERLA EOMI  ENT: mucous membranes moist  Neck: supple w/o JVD  Cardiovascular: RRR w/o MRG  Respiratory: CTA B  Abdomen: soft, nt, nd, bs+  Skin: no rash nor lesion  Musculoskeletal: MAE, full ROM all 4 extremities  Psychiatric: normal tone and affect  Neurologic: AAOx3, grossly non-focal            Future Appointments Provider Department Dept Phone   10/22/2012 12:45 PM Gordy Savers, MD [facility] [place] [phone]        Signed: Richarda Overlie 09/14/2012, 10:47 AM

## 2012-09-14 NOTE — Progress Notes (Signed)
Pt given discharge instructions and follow up information. She was educated on the importance of antibiotics and completing the full course. She has no nausea/vomiting at this time and is ready to go home. She is being discharged with her significant other at this time.

## 2012-09-15 ENCOUNTER — Telehealth: Payer: Self-pay | Admitting: *Deleted

## 2012-09-15 NOTE — Telephone Encounter (Signed)
Phenergan 25 mg #30. One every 6 hours as needed for nausea

## 2012-09-15 NOTE — Telephone Encounter (Signed)
Called pt for transitional care after being discharged from hospital on 2-9//c/o nausea and would like med for that - was in hospital for sinusitis and sinus headache- has ov with you on 2/14--please advise

## 2012-09-15 NOTE — Telephone Encounter (Signed)
Opened in error

## 2012-09-16 NOTE — Telephone Encounter (Signed)
Transitional care management  Admit 2/04-2013 Discharge 2/10-14  Primary diagnosis  URI  Headache  nausea and vomiting  Talked with pt and she states she was feeling better,although still has facial pain and some nausea (dr Amador Cunas has ordered phenergan), but has improved since being discharged from hospital.  She is compliant with her medication regimen .  Post hospital appointment with dr Amador Cunas is scheduled for 09-18-2012 at 11 am

## 2012-09-17 NOTE — ED Provider Notes (Signed)
Medical screening examination/treatment/procedure(s) were performed by non-physician practitioner and as supervising physician I was immediately available for consultation/collaboration.  Raeford Razor, MD 09/17/12 [phone]

## 2012-09-18 ENCOUNTER — Ambulatory Visit: Payer: Medicare PPO | Admitting: Internal Medicine

## 2012-09-18 ENCOUNTER — Other Ambulatory Visit: Payer: Self-pay | Admitting: Internal Medicine

## 2012-09-19 LAB — CULTURE, BLOOD (ROUTINE X 2): Culture: NO GROWTH

## 2012-09-23 ENCOUNTER — Encounter: Payer: Self-pay | Admitting: Internal Medicine

## 2012-09-23 ENCOUNTER — Ambulatory Visit (INDEPENDENT_AMBULATORY_CARE_PROVIDER_SITE_OTHER): Payer: Medicare HMO | Admitting: Internal Medicine

## 2012-09-23 VITALS — BP 120/70 | HR 96 | Temp 98.4°F | Resp 18 | Wt 132.0 lb

## 2012-09-23 DIAGNOSIS — N390 Urinary tract infection, site not specified: Secondary | ICD-10-CM

## 2012-09-23 DIAGNOSIS — E039 Hypothyroidism, unspecified: Secondary | ICD-10-CM

## 2012-09-23 DIAGNOSIS — R112 Nausea with vomiting, unspecified: Secondary | ICD-10-CM

## 2012-09-23 NOTE — Patient Instructions (Signed)
Call or return to clinic prn if these symptoms worsen or fail to improve as anticipated.

## 2012-09-23 NOTE — Progress Notes (Signed)
Subjective:    Patient ID: Julie Duffy, female    DOB: Oct 15, 1945, 67 y.o.   MRN: [id number]  HPI  67 year old patient who is seen today following her hospital discharge. She was admitted to the hospital 10 days ago due 2 intractable nausea and vomiting. She was treated symptomatically for a URI and also received treatment for acute urinary tract infection. She was initially treated with Rocephin and discharged on Cipro which he has completed. Chest x-ray revealed no pneumonia. Since her discharge she has done well and continues to improve she is now tolerating her diet without difficulty her appetite has normalized. She remains slightly weak but improving daily. No fever or chills. Denies any dysuria or frequency. No recurrent nausea or vomiting  Hospital records reviewed  Past Medical History  Diagnosis Date  . Hypothyroid   . Allergy     seasonal  . Scoliosis   . Anxiety     History   Social History  . Marital Status: Divorced    Spouse Name: N/A    Number of Children: 2  . Years of Education: N/A   Occupational History  . CMA    Social History Main Topics  . Smoking status: Never Smoker   . Smokeless tobacco: Never Used  . Alcohol Use: No  . Drug Use: No  . Sexually Active: Not on file   Other Topics Concern  . Not on file   Social History Narrative  . No narrative on file    Past Surgical History  Procedure Laterality Date  . Nasal sinus surgery    . Neck lesion biopsy      cysts benign  . Umbilical hernia repair    . Appendectomy    . Tubal ligation      Family History  Problem Relation Age of Onset  . Cancer Mother     colon ca  . Heart disease Mother   . Colon cancer Mother   . Cancer Father     Allergies  Allergen Reactions  . Guaifenesin & Derivatives Nausea And Vomiting  . Clindamycin/Lincomycin Cross Reactors Other (See Comments)    GI intolerance  . Codeine Other (See Comments)    hallucinations    Current Outpatient Prescriptions  on File Prior to Visit  Medication Sig Dispense Refill  . ALPRAZolam (XANAX) 0.5 MG tablet TAKE 1 TABLET BY MOUTH TWICE A DAY AS NEEDED FOR SLEEP  60 tablet  1  . fluticasone (FLONASE) 50 MCG/ACT nasal spray Place 1 spray into the nose daily as needed. For allergies       . levothyroxine (SYNTHROID, LEVOTHROID) 88 MCG tablet Take 1 tablet (88 mcg total) by mouth daily.  90 tablet  4   No current facility-administered medications on file prior to visit.    BP 120/70  Pulse 96  Temp(Src) 98.4 F (36.9 C) (Oral)  Resp 18  Wt 132 lb (59.875 kg)  BMI 22.65 kg/m2  SpO2 93%       Review of Systems  Constitutional: Positive for fatigue.  HENT: Negative for hearing loss, congestion, sore throat, rhinorrhea, dental problem, sinus pressure and tinnitus.   Eyes: Negative for pain, discharge and visual disturbance.  Respiratory: Negative for cough and shortness of breath.   Cardiovascular: Negative for chest pain, palpitations and leg swelling.  Gastrointestinal: Negative for nausea, vomiting, abdominal pain, diarrhea, constipation, blood in stool and abdominal distention.  Genitourinary: Negative for dysuria, urgency, frequency, hematuria, flank pain, vaginal bleeding, vaginal discharge, difficulty urinating,  vaginal pain and pelvic pain.  Musculoskeletal: Negative for joint swelling, arthralgias and gait problem.  Skin: Negative for rash.  Neurological: Positive for weakness. Negative for dizziness, syncope, speech difficulty, numbness and headaches.  Hematological: Negative for adenopathy.  Psychiatric/Behavioral: Negative for behavioral problems, dysphoric mood and agitation. The patient is not nervous/anxious.        Objective:   Physical Exam  Constitutional: She is oriented to person, place, and time. She appears well-developed and well-nourished.  HENT:  Head: Normocephalic.  Right Ear: External ear normal.  Left Ear: External ear normal.  Mouth/Throat: Oropharynx is clear  and moist.  Eyes: Conjunctivae and EOM are normal. Pupils are equal, round, and reactive to light.  Neck: Normal range of motion. Neck supple. No thyromegaly present.  Cardiovascular: Normal rate, regular rhythm, normal heart sounds and intact distal pulses.   Pulmonary/Chest: Effort normal and breath sounds normal.  Abdominal: Soft. Bowel sounds are normal. She exhibits no mass. There is no tenderness.  Musculoskeletal: Normal range of motion.  Lymphadenopathy:    She has no cervical adenopathy.  Neurological: She is alert and oriented to person, place, and time.  Skin: Skin is warm and dry. No rash noted.  Psychiatric: She has a normal mood and affect. Her behavior is normal.          Assessment & Plan:   Status post a UTI Status post URI History intractable nausea and vomiting. Now resolved Hypothyroidism  We'll continue present medication. Medications refilled will return when necessary or in one year

## 2012-09-28 ENCOUNTER — Ambulatory Visit: Payer: Medicare PPO | Admitting: Internal Medicine

## 2012-10-14 DIAGNOSIS — E039 Hypothyroidism, unspecified: Secondary | ICD-10-CM

## 2012-10-14 DIAGNOSIS — N390 Urinary tract infection, site not specified: Secondary | ICD-10-CM

## 2012-10-22 ENCOUNTER — Ambulatory Visit: Payer: Medicare PPO | Admitting: Internal Medicine

## 2012-11-20 ENCOUNTER — Other Ambulatory Visit: Payer: Self-pay | Admitting: Internal Medicine

## 2013-02-13 ENCOUNTER — Other Ambulatory Visit: Payer: Self-pay | Admitting: Internal Medicine

## 2013-04-07 ENCOUNTER — Encounter: Payer: Self-pay | Admitting: Internal Medicine

## 2013-04-07 ENCOUNTER — Other Ambulatory Visit (INDEPENDENT_AMBULATORY_CARE_PROVIDER_SITE_OTHER): Payer: Medicare HMO

## 2013-04-07 ENCOUNTER — Ambulatory Visit (INDEPENDENT_AMBULATORY_CARE_PROVIDER_SITE_OTHER): Payer: Medicare HMO | Admitting: Internal Medicine

## 2013-04-07 VITALS — BP 168/88 | HR 68 | Temp 97.9°F | Resp 16 | Wt 128.0 lb

## 2013-04-07 DIAGNOSIS — N390 Urinary tract infection, site not specified: Secondary | ICD-10-CM

## 2013-04-07 LAB — URINALYSIS, ROUTINE W REFLEX MICROSCOPIC
Bilirubin Urine: NEGATIVE
Ketones, ur: NEGATIVE
Total Protein, Urine: NEGATIVE
Urine Glucose: NEGATIVE
pH: 6 (ref 5.0–8.0)

## 2013-04-07 LAB — URINALYSIS
Bilirubin Urine: NEGATIVE
Ketones, ur: NEGATIVE
Total Protein, Urine: NEGATIVE
Urine Glucose: NEGATIVE

## 2013-04-07 MED ORDER — CIPROFLOXACIN HCL 250 MG PO TABS: 250.0000 mg | ORAL_TABLET | Freq: Two times a day (BID) | ORAL | Status: DC

## 2013-04-07 NOTE — Assessment & Plan Note (Signed)
Cipro UA

## 2013-04-07 NOTE — Progress Notes (Signed)
  Subjective:    Patient ID: Julie Duffy, female    DOB: July 24, 1946, 67 y.o.   MRN: [id number]  Urinary Frequency  This is a new problem. The current episode started in the past 7 days. The problem occurs every urination. The problem has been unchanged. The quality of the pain is described as burning. There has been no fever. Associated symptoms include frequency and urgency. Pertinent negatives include no chills or nausea.      Review of Systems  Constitutional: Negative for chills, activity change, appetite change, fatigue and unexpected weight change.  HENT: Negative for congestion, mouth sores and sinus pressure.   Eyes: Negative for visual disturbance.  Respiratory: Negative for cough and chest tightness.   Gastrointestinal: Negative for nausea and abdominal pain.  Genitourinary: Positive for dysuria, urgency and frequency. Negative for difficulty urinating and vaginal pain.  Musculoskeletal: Negative for back pain and gait problem.  Skin: Negative for pallor and rash.  Neurological: Negative for dizziness, tremors, weakness, numbness and headaches.  Psychiatric/Behavioral: Negative for confusion and sleep disturbance.       Objective:   Physical Exam  Constitutional: She appears well-developed. No distress.  HENT:  Head: Normocephalic.  Right Ear: External ear normal.  Left Ear: External ear normal.  Nose: Nose normal.  Mouth/Throat: Oropharynx is clear and moist.  Eyes: Conjunctivae are normal. Pupils are equal, round, and reactive to light. Right eye exhibits no discharge. Left eye exhibits no discharge.  Neck: Normal range of motion. Neck supple. No JVD present. No tracheal deviation present. No thyromegaly present.  Cardiovascular: Normal rate, regular rhythm and normal heart sounds.   Pulmonary/Chest: No stridor. No respiratory distress. She has no wheezes.  Abdominal: Soft. Bowel sounds are normal. She exhibits no distension and no mass. There is no tenderness.  There is no rebound and no guarding.  Musculoskeletal: She exhibits no edema and no tenderness.  Lymphadenopathy:    She has no cervical adenopathy.  Neurological: She displays normal reflexes. No cranial nerve deficit. She exhibits normal muscle tone. Coordination normal.  Skin: No rash noted. No erythema.  Psychiatric: She has a normal mood and affect. Her behavior is normal. Judgment and thought content normal.          Assessment & Plan:

## 2013-04-07 NOTE — Patient Instructions (Signed)
[facility]

## 2013-04-12 ENCOUNTER — Telehealth: Payer: Self-pay | Admitting: Internal Medicine

## 2013-04-12 NOTE — Telephone Encounter (Signed)
Pt was seen on 04/07/13 for UTI and was given a 5 day supply of Cipro.  Pt states she is somewhat better but still having urge to urinate and discomfort when urinating.  Wants to know if PCP would order another round of antibiotics.

## 2013-04-12 NOTE — Telephone Encounter (Signed)
Generic Septra DS #14 one twice a day 

## 2013-04-13 ENCOUNTER — Telehealth: Payer: Self-pay | Admitting: Internal Medicine

## 2013-04-13 MED ORDER — SULFAMETHOXAZOLE-TMP DS 800-160 MG PO TABS: 1.0000 | ORAL_TABLET | Freq: Two times a day (BID) | ORAL | Status: DC

## 2013-04-13 NOTE — Telephone Encounter (Signed)
Left message on voicemail to call office. Rx sent to pharmacy.  

## 2013-04-13 NOTE — Telephone Encounter (Signed)
[facility] Triage Call Report Triage Record Num: [id number] Operator: Frederico Hamman Patient Name: Julie Duffy Call Date & Time: 04/12/2013 5:13:13PM Patient Phone: [phone] PCP: Gordy Savers Patient Gender: Female PCP Fax : [phone] Patient DOB: Feb 14, 1946 Practice Name: [facility] Reason for Call: [name] states she was seen in [place] office on 04/07/13 due to symptoms of UTI. Was ordered Cipro x 5 days which see has completed. States she continues to have urge to urinate but unable to urinate. Has slight left flank pain that she rates a 6 on 1/10 pt scale. Has minor intermittent crampy sensation at navel- rates 3 on 1/10 pt scale. Afebrile. Per flank pain protocol has see provider within 4 hrs disposition due to flank pain and urinary tract symptoms. Called Dr. Dan Humphreys to see if appt can wait until 04/13/13. Dr. Dan Humphreys advised to go to ED now. May need to have a CT scan to evaluated for possible kidney stone. Advised ED. Care advice given. Protocol(s) Used: Flank Pain Protocol(s) Used: Urinary Symptoms - Female Recommended Outcome per Protocol: See Provider within 4 hours Reason for Outcome: Flank pain Flank pain or low back pain AND urinary tract symptoms Care Advice: ~ SYMPTOM

## 2013-06-18 ENCOUNTER — Other Ambulatory Visit: Payer: Self-pay | Admitting: Internal Medicine

## 2013-06-23 NOTE — Telephone Encounter (Signed)
Pt is still waiting on alprazolam °

## 2013-06-25 ENCOUNTER — Other Ambulatory Visit: Payer: Self-pay | Admitting: Internal Medicine

## 2013-08-16 ENCOUNTER — Telehealth: Payer: Self-pay | Admitting: Internal Medicine

## 2013-08-16 NOTE — Telephone Encounter (Signed)
Patient Information:  Caller Name: Nelva Bushorma  Phone: [phone]  Patient: Julie Duffy, Julie Duffy  Gender: Female  DOB: Mar 12, 1946  Age: 68 Years  PCP: Eleonore ChiquitoKwiatkowski, Peter (Family Practice > 2029yrs old)  Office Follow Up:  Does the office need to follow up with this patient?: No  Instructions For The Office: N/A  RN Note:  Advised pt a normal resting heart rate for adults ranges from 60 to 100 beats a minute. Keep in mind that many factors can influence heart rate, including:  Activity level Fitness level Air temperature Body position (standing up or lying down, for example) Emotions Body size Medications Advised pt to schedule app to have thyroid levels checked due to fatigue.  Symptoms  Reason For Call & Symptoms: Pt states she has fatigue, and feels it is her thyroid.  Pt states her heart rate 50 and BP 116/77.  Pt asking what the normal range for heart rate in adults.  Reviewed Health History In EMR: Yes  Reviewed Medications In EMR: Yes  Reviewed Allergies In EMR: Yes  Reviewed Surgeries / Procedures: Yes  Date of Onset of Symptoms: 08/16/2013  Guideline(s) Used:  No Protocol Available - Information Only  Disposition Per Guideline:   Home Care  Reason For Disposition Reached:   Information only question and nurse able to answer  Advice Given:  N/A  Patient Will Follow Care Advice:  YES

## 2013-12-13 ENCOUNTER — Other Ambulatory Visit: Payer: Self-pay | Admitting: Internal Medicine

## 2014-01-07 ENCOUNTER — Ambulatory Visit: Payer: Medicare HMO | Admitting: Family Medicine

## 2014-01-10 ENCOUNTER — Ambulatory Visit (INDEPENDENT_AMBULATORY_CARE_PROVIDER_SITE_OTHER): Payer: Medicare HMO | Admitting: Internal Medicine

## 2014-01-10 ENCOUNTER — Encounter: Payer: Self-pay | Admitting: Internal Medicine

## 2014-01-10 VITALS — BP 150/80 | HR 56 | Temp 98.3°F | Resp 18 | Ht 64.0 in | Wt 131.0 lb

## 2014-01-10 DIAGNOSIS — L659 Nonscarring hair loss, unspecified: Secondary | ICD-10-CM

## 2014-01-10 DIAGNOSIS — L80 Vitiligo: Secondary | ICD-10-CM

## 2014-01-10 DIAGNOSIS — R5383 Other fatigue: Secondary | ICD-10-CM

## 2014-01-10 DIAGNOSIS — E039 Hypothyroidism, unspecified: Secondary | ICD-10-CM

## 2014-01-10 DIAGNOSIS — R634 Abnormal weight loss: Secondary | ICD-10-CM

## 2014-01-10 DIAGNOSIS — R202 Paresthesia of skin: Secondary | ICD-10-CM

## 2014-01-10 DIAGNOSIS — R630 Anorexia: Secondary | ICD-10-CM

## 2014-01-10 DIAGNOSIS — M79609 Pain in unspecified limb: Secondary | ICD-10-CM

## 2014-01-10 DIAGNOSIS — R5381 Other malaise: Secondary | ICD-10-CM

## 2014-01-10 DIAGNOSIS — R209 Unspecified disturbances of skin sensation: Secondary | ICD-10-CM

## 2014-01-10 LAB — CBC WITH DIFFERENTIAL/PLATELET
BASOS ABS: 0 10*3/uL (ref 0.0–0.1)
BASOS PCT: 0.5 % (ref 0.0–3.0)
EOS ABS: 0.3 10*3/uL (ref 0.0–0.7)
Eosinophils Relative: 4.7 % (ref 0.0–5.0)
HCT: 38.2 % (ref 36.0–46.0)
Hemoglobin: 12.7 g/dL (ref 12.0–15.0)
LYMPHS PCT: 28.7 % (ref 12.0–46.0)
Lymphs Abs: 2.1 10*3/uL (ref 0.7–4.0)
MCHC: 33.2 g/dL (ref 30.0–36.0)
MCV: 89.5 fl (ref 78.0–100.0)
MONOS PCT: 7.1 % (ref 3.0–12.0)
Monocytes Absolute: 0.5 10*3/uL (ref 0.1–1.0)
NEUTROS PCT: 59 % (ref 43.0–77.0)
Neutro Abs: 4.4 10*3/uL (ref 1.4–7.7)
Platelets: 297 10*3/uL (ref 150.0–400.0)
RBC: 4.27 Mil/uL (ref 3.87–5.11)
RDW: 13.4 % (ref 11.5–15.5)
WBC: 7.4 10*3/uL (ref 4.0–10.5)

## 2014-01-10 LAB — COMPREHENSIVE METABOLIC PANEL
ALT: 22 U/L (ref 0–35)
AST: 25 U/L (ref 0–37)
Albumin: 3.8 g/dL (ref 3.5–5.2)
Alkaline Phosphatase: 72 U/L (ref 39–117)
BUN: 22 mg/dL (ref 6–23)
CALCIUM: 9.7 mg/dL (ref 8.4–10.5)
CHLORIDE: 101 meq/L (ref 96–112)
CO2: 31 meq/L (ref 19–32)
CREATININE: 1 mg/dL (ref 0.4–1.2)
GFR: 57.78 mL/min — AB (ref >60.00)
GLUCOSE: 91 mg/dL (ref 70–99)
Potassium: 5 mEq/L (ref 3.5–5.1)
Sodium: 139 mEq/L (ref 135–145)
Total Bilirubin: 0.7 mg/dL (ref 0.2–1.2)
Total Protein: 6.9 g/dL (ref 6.0–8.3)

## 2014-01-10 LAB — SEDIMENTATION RATE: SED RATE: 17 mm/h (ref 0–22)

## 2014-01-10 NOTE — Patient Instructions (Signed)
It is important that you exercise regularly, at least 20 minutes 3 to 4 times per week.  If you develop chest pain or shortness of breath seek  medical attention.  Return in one month for follow-up  

## 2014-01-10 NOTE — Progress Notes (Signed)
Pre-visit discussion using our clinic review tool. No additional management support is needed unless otherwise documented below in the visit note.  

## 2014-01-10 NOTE — Progress Notes (Signed)
Subjective:    Patient ID: ANSHI RYGG, female    DOB: 1946-08-05, 68 y.o.   MRN: [id number]  HPI  68 year old patient who has a history of treated hypothyroidism and the like.  For the past 6 months has had increasing fatigue, anorexia, weight loss, and some intermittent headaches.  Her chief complaint is hair loss over the past 4 weeks. She has a sister with a history of lupus;  approximately 2 years ago due to upper extremity paresthesias and had a number of studies done to check for autoimmune disease that were noncontributory  Wt Readings from Last 3 Encounters:  01/10/14 131 lb (59.421 kg)  04/07/13 128 lb (58.06 kg)  09/23/12 132 lb (59.875 kg)    Past Medical History  Diagnosis Date  . Hypothyroid   . Allergy     seasonal  . Scoliosis   . Anxiety     History   Social History  . Marital Status: Divorced    Spouse Name: N/A    Number of Children: 2  . Years of Education: N/A   Occupational History  . CMA    Social History Main Topics  . Smoking status: Never Smoker   . Smokeless tobacco: Never Used  . Alcohol Use: No  . Drug Use: No  . Sexual Activity: Not on file   Other Topics Concern  . Not on file   Social History Narrative  . No narrative on file    Past Surgical History  Procedure Laterality Date  . Nasal sinus surgery    . Neck lesion biopsy      cysts benign  . Umbilical hernia repair    . Appendectomy    . Tubal ligation      Family History  Problem Relation Age of Onset  . Cancer Mother     colon ca  . Heart disease Mother   . Colon cancer Mother   . Cancer Father     Allergies  Allergen Reactions  . Guaifenesin & Derivatives Nausea And Vomiting  . Clindamycin/Lincomycin Cross Reactors Other (See Comments)    GI intolerance  . Codeine Other (See Comments)    hallucinations    Current Outpatient Prescriptions on File Prior to Visit  Medication Sig Dispense Refill  . ALPRAZolam (XANAX) 0.5 MG tablet TAKE 1 TABLET TWICE A  DAY  60 tablet  2  . levothyroxine (SYNTHROID, LEVOTHROID) 88 MCG tablet TAKE ONE TABLET BY MOUTH ONCE DAILY  90 tablet  1   No current facility-administered medications on file prior to visit.    BP 150/80  Pulse 56  Temp(Src) 98.3 F (36.8 C) (Oral)  Resp 18  Ht 5\' 4"  (1.626 m)  Wt 131 lb (59.421 kg)  BMI 22.47 kg/m2  SpO2 98%       Review of Systems  Constitutional: Positive for fatigue. Negative for fever, appetite change and unexpected weight change.  HENT: Negative for congestion, dental problem, ear pain, hearing loss, mouth sores, nosebleeds, sinus pressure, sore throat, tinnitus, trouble swallowing and voice change.   Eyes: Negative for photophobia, pain, redness and visual disturbance.  Respiratory: Negative for cough, chest tightness and shortness of breath.   Cardiovascular: Negative for chest pain, palpitations and leg swelling.  Gastrointestinal: Negative for nausea, vomiting, abdominal pain, diarrhea, constipation, blood in stool, abdominal distention and rectal pain.  Genitourinary: Negative for dysuria, urgency, frequency, hematuria, flank pain, vaginal bleeding, vaginal discharge, difficulty urinating, genital sores, vaginal pain, menstrual problem and pelvic pain.  Musculoskeletal: Negative for arthralgias, back pain and neck stiffness.  Skin: Negative for rash.       Patchy alopecia  Neurological: Positive for weakness and headaches. Negative for dizziness, syncope, speech difficulty, light-headedness and numbness.  Hematological: Negative for adenopathy. Does not bruise/bleed easily.  Psychiatric/Behavioral: Negative for suicidal ideas, behavioral problems, self-injury, dysphoric mood and agitation. The patient is not nervous/anxious.        Objective:   Physical Exam  Constitutional: She is oriented to person, place, and time. She appears well-developed and well-nourished.  HENT:  Head: Normocephalic.  Right Ear: External ear normal.  Left Ear:  External ear normal.  Mouth/Throat: Oropharynx is clear and moist.  Eyes: Conjunctivae and EOM are normal. Pupils are equal, round, and reactive to light.  Neck: Normal range of motion. Neck supple. No thyromegaly present.  Cardiovascular: Normal rate, regular rhythm, normal heart sounds and intact distal pulses.   Pulmonary/Chest: Effort normal and breath sounds normal.  Abdominal: Soft. Bowel sounds are normal. She exhibits no mass. There is no tenderness.  Musculoskeletal: Normal range of motion.  Lymphadenopathy:    She has no cervical adenopathy.  Neurological: She is alert and oriented to person, place, and time.  Skin: Skin is warm and dry. No rash noted.  Some hair loss involving the vertex area of the scalp  Scattered areas of vitiligo  Psychiatric: She has a normal mood and affect. Her behavior is normal.          Assessment & Plan:   Patchy alopecia in a patient with hypothyroidism and vitiligo.  We'll check a TSH.  May need referral to dermatology.  We'll check screening lab History weight loss Fatigue  Screening lab including sed.  Rate will be reviewed

## 2014-01-11 LAB — TSH: TSH: 0.37 u[IU]/mL (ref 0.35–4.50)

## 2014-01-11 LAB — T4, FREE: Free T4: 1.11 ng/dL (ref 0.60–1.60)

## 2014-02-08 ENCOUNTER — Other Ambulatory Visit: Payer: Self-pay | Admitting: Internal Medicine

## 2014-03-15 ENCOUNTER — Telehealth: Payer: Self-pay | Admitting: Internal Medicine

## 2014-03-15 DIAGNOSIS — L659 Nonscarring hair loss, unspecified: Secondary | ICD-10-CM

## 2014-03-15 NOTE — Telephone Encounter (Signed)
Order for referral to Dermatologist sent to Referral Coordinator Stanton KidneyDebra, put in as Urgent due to appointment tomorrow.

## 2014-03-15 NOTE — Addendum Note (Signed)
Addended by: Jimmye NormanPHANOS, Radwan Cowley J on: 03/15/2014 12:03 PM   Modules accepted: Orders

## 2014-03-15 NOTE — Telephone Encounter (Signed)
Pt made her own appt to see  dr tafeen for hair loss and vitiligo. Pt has appt tomorrow 03-16-14. Pt has humana . Can we do referral?

## 2014-03-15 NOTE — Telephone Encounter (Signed)
Julie CanavanKrystal from [facility] states she [facility].  Pt has an appointment scheduled for 03/16/14. [phone])

## 2014-03-16 NOTE — Telephone Encounter (Signed)
done Authorization approved  [id number] start 03/16/2014 - 09/16/2014 [facility]: Janalyn Harderafeen Stuart MD   Address: [address], [place], [place] [address]  [phone] Faxed [phone]

## 2014-03-21 ENCOUNTER — Telehealth: Payer: Self-pay | Admitting: Internal Medicine

## 2014-03-21 DIAGNOSIS — E039 Hypothyroidism, unspecified: Secondary | ICD-10-CM

## 2014-03-21 DIAGNOSIS — L80 Vitiligo: Secondary | ICD-10-CM

## 2014-03-21 DIAGNOSIS — L659 Nonscarring hair loss, unspecified: Secondary | ICD-10-CM

## 2014-03-21 NOTE — Telephone Encounter (Signed)
Pt would like a referral to endo for tsh,vitiligo and hair loss. Pt has humana

## 2014-03-21 NOTE — Telephone Encounter (Signed)
Please advise 

## 2014-03-21 NOTE — Telephone Encounter (Signed)
Okay 

## 2014-03-22 NOTE — Telephone Encounter (Signed)
Order for referral to Endocrinology sent.

## 2014-04-12 ENCOUNTER — Ambulatory Visit (INDEPENDENT_AMBULATORY_CARE_PROVIDER_SITE_OTHER): Payer: Commercial Managed Care - HMO | Admitting: Endocrinology

## 2014-04-12 ENCOUNTER — Encounter: Payer: Self-pay | Admitting: Endocrinology

## 2014-04-12 VITALS — BP 132/74 | HR 60 | Temp 97.9°F | Ht 64.0 in | Wt 131.0 lb

## 2014-04-12 DIAGNOSIS — R202 Paresthesia of skin: Secondary | ICD-10-CM

## 2014-04-12 DIAGNOSIS — R209 Unspecified disturbances of skin sensation: Secondary | ICD-10-CM | POA: Diagnosis not present

## 2014-04-12 DIAGNOSIS — M79609 Pain in unspecified limb: Secondary | ICD-10-CM

## 2014-04-12 DIAGNOSIS — I959 Hypotension, unspecified: Secondary | ICD-10-CM

## 2014-04-12 LAB — CORTISOL
Cortisol, Plasma: 6.4 ug/dL
Cortisol, Plasma: 21.9 ug/dL

## 2014-04-12 LAB — VITAMIN B12: VITAMIN B 12: 288 pg/mL (ref 211–911)

## 2014-04-12 MED ORDER — COSYNTROPIN 0.25 MG IJ SOLR
0.2500 mg | Freq: Once | INTRAMUSCULAR | Status: AC
  Administered 2014-04-12: 0.25 mg via INTRAMUSCULAR

## 2014-04-12 NOTE — Progress Notes (Signed)
Subjective:    Patient ID: Julie Duffy, female    DOB: July 06, 1946, 68 y.o.   MRN: [id number]  HPI Pt reports hypothyroidism was dx'ed in 2005.  She has been on prescribed thyroid hormone therapy since then.  she has never taken kelp or any other type of non-prescribed thyroid product.  she has never had thyroid imaging. she has never had thyroid surgery, or XRT to the neck.  she has never been on amiodarone or lithium.   Pt states moderate hair loss from the head, and assoc fatigue.  She had menopause at age 9.   Past Medical History  Diagnosis Date  . Hypothyroid   . Allergy     seasonal  . Scoliosis   . Anxiety     Past Surgical History  Procedure Laterality Date  . Nasal sinus surgery    . Neck lesion biopsy      cysts benign  . Umbilical hernia repair    . Appendectomy    . Tubal ligation      History   Social History  . Marital Status: Divorced    Spouse Name: N/A    Number of Children: 2  . Years of Education: N/A   Occupational History  . CMA    Social History Main Topics  . Smoking status: Never Smoker   . Smokeless tobacco: Never Used  . Alcohol Use: No  . Drug Use: No  . Sexual Activity: Not on file   Other Topics Concern  . Not on file   Social History Narrative  . No narrative on file    Current Outpatient Prescriptions on File Prior to Visit  Medication Sig Dispense Refill  . ALPRAZolam (XANAX) 0.5 MG tablet TAKE 1 TABLET TWICE DAILY  60 tablet  2  . levothyroxine (SYNTHROID, LEVOTHROID) 88 MCG tablet TAKE ONE TABLET BY MOUTH ONCE DAILY  90 tablet  1   No current facility-administered medications on file prior to visit.    Allergies  Allergen Reactions  . Guaifenesin & Derivatives Nausea And Vomiting  . Clindamycin/Lincomycin Cross Reactors Other (See Comments)    GI intolerance  . Codeine Other (See Comments)    hallucinations    Family History  Problem Relation Age of Onset  . Cancer Mother     colon ca  . Heart disease  Mother   . Colon cancer Mother   . Cancer Father   . Thyroid disease Daughter   . Thyroid disease Mother     BP 132/74  Pulse 60  Temp(Src) 97.9 F (36.6 C) (Oral)  Ht  (1.626 m)  Wt 131 lb (59.421 kg)  BMI 22.47 kg/m2  SpO2 96%   Review of Systems denies muscle cramps, sob, numbness, blurry vision, myalgias, rhinorrhea, diarrhea, and syncope.  She has paleness of the skin on the forearms.  She has mild depression, cold intolerance, easy bruising, constipation, and difficulty with concentration.  She has lost a few lbs.    Objective:   Physical Exam VS: see vs page GEN: no distress HEAD: head: no deformity eyes: no periorbital swelling, no proptosis external nose and ears are normal mouth: no lesion seen NECK: a healed scar is present (not thyroid-related).  i do not appreciate a nodule in the thyroid or elsewhere in the neck CHEST WALL: no deformity LUNGS:  Clear to auscultation CV: reg rate and rhythm, no murmur ABD: abdomen is soft, nontender.  no hepatosplenomegaly.  not distended.  no hernia MUSCULOSKELETAL: muscle  bulk and strength are grossly normal.  no obvious joint swelling.  gait is normal and steady EXTEMITIES: no deformity.  no ulcer on the feet.  feet are of normal color and temp.  no edema PULSES: dorsalis pedis intact bilat.  no carotid bruit NEURO:  cn 2-12 grossly intact.   readily moves all 4's.  sensation is intact to touch on the feet SKIN:  Normal texture and temperature.  No rash or suspicious lesion is visible.  Vitiligo is noted on UE's. NODES:  None palpable at the neck PSYCH: alert, well-oriented.  Does not appear anxious nor depressed.  acth stimulation test is done: baseline cortisol level=6 then cosyntropin 250 mcg is given im 45 minutes later, cortisol level=22 (normal response)  Lab Results  Component Value Date   TSH 0.37 01/10/2014    i reviewed CXR report (from 2014) no goiter is noted    Assessment & Plan:  Hypothyroidism,  new to me: well-replaced Hypotension, by hx: adrenal insuff is excluded. Hair loss: this is not directly thyroid related.  However, this and hypothyroidism could have a common autoimmune etiology. Vitiligo, autoimmune. 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    Patient is advised the following: Patient Instructions  blood tests are being requested for you today.  We'll contact you with results. I would be happy to see you back here whenever you want.    pt is advised that she is at risk for other autoimmune disorders.

## 2014-04-12 NOTE — Patient Instructions (Addendum)
blood tests are being requested for you today.  We'll contact you with results. I would be happy to see you back here whenever you want.   

## 2014-04-13 ENCOUNTER — Telehealth: Payer: Self-pay | Admitting: Endocrinology

## 2014-04-13 NOTE — Telephone Encounter (Signed)
Lvom advising that recent lab work was normal. Requested call back if pt would like to discuss.

## 2014-04-13 NOTE — Telephone Encounter (Signed)
Patient would like to know her lab results   Please advise patient    Thank you

## 2014-04-18 ENCOUNTER — Emergency Department (HOSPITAL_COMMUNITY)
Admission: EM | Admit: 2014-04-18 | Discharge: 2014-04-18 | Disposition: A | Discharge status: Home / Self Care | Payer: Medicare HMO | Attending: Emergency Medicine | Admitting: Emergency Medicine

## 2014-04-18 ENCOUNTER — Encounter (HOSPITAL_COMMUNITY): Payer: Self-pay | Admitting: Emergency Medicine

## 2014-04-18 DIAGNOSIS — R319 Hematuria, unspecified: Secondary | ICD-10-CM | POA: Diagnosis present

## 2014-04-18 DIAGNOSIS — Z79899 Other long term (current) drug therapy: Secondary | ICD-10-CM | POA: Insufficient documentation

## 2014-04-18 DIAGNOSIS — N390 Urinary tract infection, site not specified: Secondary | ICD-10-CM | POA: Insufficient documentation

## 2014-04-18 DIAGNOSIS — Z8739 Personal history of other diseases of the musculoskeletal system and connective tissue: Secondary | ICD-10-CM | POA: Diagnosis not present

## 2014-04-18 DIAGNOSIS — E039 Hypothyroidism, unspecified: Secondary | ICD-10-CM | POA: Diagnosis not present

## 2014-04-18 LAB — URINALYSIS, ROUTINE W REFLEX MICROSCOPIC
GLUCOSE, UA: NEGATIVE mg/dL
KETONES UR: 40 mg/dL — AB
NITRITE: POSITIVE — AB
PH: 5 (ref 5.0–8.0)
Protein, ur: >300 mg/dL — AB
SPECIFIC GRAVITY, URINE: 1.013 (ref 1.005–1.030)
Urobilinogen, UA: 1 mg/dL (ref 0.0–1.0)

## 2014-04-18 LAB — URINE MICROSCOPIC-ADD ON

## 2014-04-18 LAB — BASIC METABOLIC PANEL
ANION GAP: 12 (ref 5–15)
BUN: 17 mg/dL (ref 6–23)
CHLORIDE: 100 meq/L (ref 96–112)
CO2: 25 meq/L (ref 19–32)
Calcium: 9.2 mg/dL (ref 8.4–10.5)
Creatinine, Ser: 0.92 mg/dL (ref 0.50–1.10)
GFR calc non Af Amer: 62 mL/min — ABNORMAL LOW (ref >90)
GFR, EST AFRICAN AMERICAN: 72 mL/min — AB (ref >90)
Glucose, Bld: 94 mg/dL (ref 70–99)
POTASSIUM: 3.6 meq/L — AB (ref 3.7–5.3)
SODIUM: 137 meq/L (ref 137–147)

## 2014-04-18 MED ORDER — SODIUM CHLORIDE 0.9 % IV SOLN
Freq: Once | INTRAVENOUS | Status: AC
  Administered 2014-04-18: 05:00:00 via INTRAVENOUS

## 2014-04-18 MED ORDER — DEXTROSE 5 % IV SOLN
1.0000 g | Freq: Once | INTRAVENOUS | Status: AC
  Administered 2014-04-18: 1 g via INTRAVENOUS
  Filled 2014-04-18: qty 10

## 2014-04-18 MED ORDER — CEPHALEXIN 500 MG PO CAPS: 500.0000 mg | ORAL_CAPSULE | Freq: Four times a day (QID) | ORAL | Status: DC

## 2014-04-18 NOTE — Discharge Instructions (Signed)

## 2014-04-18 NOTE — ED Notes (Signed)
Pt arrives with c/o UTI since yesterday, visible blood in urine, vaginal pain, stomach pain that started tonight. States her vagina is constantly burning. Burning with frequent urination. Denies vaginal discharge.

## 2014-04-18 NOTE — ED Provider Notes (Signed)
CSN: [id number]     Arrival date & time 04/18/14  0326 History   First MD Initiated Contact with Patient 04/18/14 [phone]     Chief Complaint  Patient presents with  . Hematuria  . Urinary Tract Infection     (Consider location/radiation/quality/duration/timing/severity/associated sxs/prior Treatment) HPI  This is a 68 year old female with a history of hypothyroidism who presents with dysuria, and urgency. Reports onset of symptoms yesterday. She states the symptoms have gotten worse and she is more comfortable. She's also developed hematuria. She is a history of urinary tract infections with similar symptoms. She denies any fevers, back, or flank pain. She denies any abdominal pain, chest pain, shortness of breath, chills.  Past Medical History  Diagnosis Date  . Hypothyroid   . Allergy     seasonal  . Scoliosis   . Anxiety    Past Surgical History  Procedure Laterality Date  . Nasal sinus surgery    . Neck lesion biopsy      cysts benign  . Umbilical hernia repair    . Appendectomy    . Tubal ligation     Family History  Problem Relation Age of Onset  . Cancer Mother     colon ca  . Heart disease Mother   . Colon cancer Mother   . Cancer Father   . Thyroid disease Daughter   . Thyroid disease Mother    History  Substance Use Topics  . Smoking status: Never Smoker   . Smokeless tobacco: Never Used  . Alcohol Use: No   OB History   Grav Para Term Preterm Abortions TAB SAB Ect Mult Living                 Review of Systems  Constitutional: Negative for fever.  Respiratory: Negative for chest tightness and shortness of breath.   Cardiovascular: Negative for chest pain.  Gastrointestinal: Negative for nausea, vomiting and abdominal pain.  Genitourinary: Positive for dysuria, urgency, frequency and hematuria. Negative for flank pain.  Musculoskeletal: Negative for back pain.  Neurological: Negative for headaches.  Psychiatric/Behavioral: Negative for confusion.   All other systems reviewed and are negative.     Allergies  Guaifenesin & derivatives; Clindamycin/lincomycin cross reactors; and Codeine  Home Medications   Prior to Admission medications   Medication Sig Start Date End Date Taking? Authorizing Provider  ALPRAZolam [name]) 0.5 MG tablet Take 0.5 mg by mouth 2 (two) times daily as needed for anxiety.   Yes Historical Provider, MD  levothyroxine (SYNTHROID, LEVOTHROID) 88 MCG tablet Take 88-176 mcg by mouth daily before breakfast. Take everyday except sundays take two tablets to equal   Yes Historical Provider, MD  cephALEXin (KEFLEX) 500 MG capsule Take 1 capsule (500 mg total) by mouth 4 (four) times daily. 04/18/14   Shon Baton, MD   BP 125/65  Pulse 64  Temp(Src) 97.9 F (36.6 C) (Oral)  Resp 18  Ht 5' 4.5" (1.638 m)  Wt 130 lb (58.968 kg)  BMI 21.98 kg/m2  SpO2 98% Physical Exam  Nursing note and vitals reviewed. Constitutional: She is oriented to person, place, and time. No distress.  HENT:  Head: Normocephalic and atraumatic.  Cardiovascular: Normal rate, regular rhythm and normal heart sounds.   No murmur heard. Pulmonary/Chest: Effort normal and breath sounds normal. No respiratory distress. She has no wheezes.  Abdominal: Soft. There is no tenderness. There is no rebound.  Neurological: She is alert and oriented to person, place, and time.  Skin: Skin is warm and dry.  Psychiatric: She has a normal mood and affect.    ED Course  Procedures (including critical care time) Labs Review Labs Reviewed  URINALYSIS, ROUTINE W REFLEX MICROSCOPIC - Abnormal; Notable for the following:    Color, Urine RED (*)    APPearance TURBID (*)    Hgb urine dipstick LARGE (*)    Bilirubin Urine LARGE (*)    Ketones, ur 40 (*)    Protein, ur >300 (*)    Nitrite POSITIVE (*)    Leukocytes, UA LARGE (*)    All other components within normal limits  BASIC METABOLIC PANEL - Abnormal; Notable for the following:     Potassium 3.6 (*)    GFR calc non Af Amer 62 (*)    GFR calc Af Amer 72 (*)    All other components within normal limits  URINE MICROSCOPIC-ADD ON - Abnormal; Notable for the following:    Squamous Epithelial / LPF FEW (*)    Bacteria, UA FEW (*)    All other components within normal limits  URINE CULTURE    Imaging Review No results found.   EKG Interpretation None      MDM   Final diagnoses:  UTI (lower urinary tract infection)    Patient presents with symptoms concerning for UTI. She is nontoxic and afebrile on exam. Vital signs are reassuring. Urinalysis is nitrite positive. Urine culture was sent. Patient has no systemic symptoms or fevers. She was given IV Rocephin. Kidney function is preserved. Patient will be discharged on Keflex. She called her primary care physician as needed.  After history, exam, and medical workup I feel the patient has been appropriately medically screened and is safe for discharge home. Pertinent diagnoses were discussed with the patient. Patient was given return precautions.     Shon Baton, MD 04/18/14 [phone]

## 2014-04-20 LAB — URINE CULTURE: Colony Count: 45000

## 2014-04-21 ENCOUNTER — Telehealth (HOSPITAL_COMMUNITY): Payer: Self-pay

## 2014-04-21 NOTE — ED Notes (Signed)
Post ED Visit - Positive Culture Follow-up  Culture report reviewed by antimicrobial stewardship pharmacist:  Wes Dulaney, Pharm.D., BCPS  Celedonio Miyamoto, [address].D., BCPS  Georgina Pillion, Pharm.D., BCPS  [place], [address].D., BCPS, AAHIVP  Estella Husk, Pharm.D., BCPS, AAHIVP  Carly Sabat, Pharm.D.  Enzo Bi, Pharm.D.  Positive urine culture Treated with cephalexin, organism sensitive to the same and no further patient follow-up is required at this time.  Julie Duffy 04/21/2014, 9:31 AM

## 2014-05-23 ENCOUNTER — Other Ambulatory Visit: Payer: Self-pay | Admitting: Internal Medicine

## 2014-06-07 ENCOUNTER — Encounter: Payer: Self-pay | Admitting: Internal Medicine

## 2014-06-07 ENCOUNTER — Ambulatory Visit (INDEPENDENT_AMBULATORY_CARE_PROVIDER_SITE_OTHER): Payer: Medicare HMO | Admitting: Internal Medicine

## 2014-06-07 VITALS — BP 112/70 | HR 61 | Temp 98.0°F | Resp 20 | Ht 64.5 in | Wt 136.0 lb

## 2014-06-07 DIAGNOSIS — E034 Atrophy of thyroid (acquired): Secondary | ICD-10-CM

## 2014-06-07 DIAGNOSIS — H00013 Hordeolum externum right eye, unspecified eyelid: Secondary | ICD-10-CM

## 2014-06-07 DIAGNOSIS — E038 Other specified hypothyroidism: Secondary | ICD-10-CM

## 2014-06-07 MED ORDER — NEOMYCIN-POLYMYXIN-HC 3.5-10000-1 OP SUSP: 2.0000 [drp] | Freq: Four times a day (QID) | OPHTHALMIC | Status: DC

## 2014-06-07 NOTE — Progress Notes (Signed)
Pre visit review using our clinic review tool, if applicable. No additional management support is needed unless otherwise documented below in the visit note. 

## 2014-06-07 NOTE — Patient Instructions (Signed)
Call or return to clinic prn if these symptoms worsen or fail to improve as anticipated. Sty A sty (hordeolum) is an infection of a gland in the eyelid located at the base of the eyelash. A sty may develop a white or yellow head of pus. It can be puffy (swollen). Usually, the sty will burst and pus will come out on its own. They do not leave lumps in the eyelid once they drain. A sty is often confused with another form of cyst of the eyelid called a chalazion. Chalazions occur within the eyelid and not on the edge where the bases of the eyelashes are. They often are red, sore and then form firm lumps in the eyelid. CAUSES   Germs (bacteria).  Lasting (chronic) eyelid inflammation. SYMPTOMS   Tenderness, redness and swelling along the edge of the eyelid at the base of the eyelashes.  Sometimes, there is a white or yellow head of pus. It may or may not drain. DIAGNOSIS  An ophthalmologist will be able to distinguish between a sty and a chalazion and treat the condition appropriately.  TREATMENT   Styes are typically treated with warm packs (compresses) until drainage occurs.  In rare cases, medicines that kill germs (antibiotics) may be prescribed. These antibiotics may be in the form of drops, cream or pills.  If a hard lump has formed, it is generally necessary to do a small incision and remove the hardened contents of the cyst in a minor surgical procedure done in the office.  In suspicious cases, your caregiver may send the contents of the cyst to the lab to be certain that it is not a rare, but dangerous form of cancer of the glands of the eyelid. HOME CARE INSTRUCTIONS   Wash your hands often and dry them with a clean towel. Avoid touching your eyelid. This may spread the infection to other parts of the eye.  Apply heat to your eyelid for 10 to 20 minutes, several times a day, to ease pain and help to heal it faster.  Do not squeeze the sty. Allow it to drain on its own. Wash your  eyelid carefully 3 to 4 times per day to remove any pus. SEEK IMMEDIATE MEDICAL CARE IF:   Your eye becomes painful or puffy (swollen).  Your vision changes.  Your sty does not drain by itself within 3 days.  Your sty comes back within a short period of time, even with treatment.  You have redness (inflammation) around the eye.  You have a fever. Document Released: 05/01/2005 Document Revised: 10/14/2011 Document Reviewed: 11/05/2013 [facility] Patient Information 2015 [place], [place]. This information is not intended to replace advice given to you by your health care provider. Make sure you discuss any questions you have with your health care provider.

## 2014-06-07 NOTE — Progress Notes (Signed)
Subjective:    Patient ID: Julie Duffy, female    DOB: 03-Oct-1945, 68 y.o.   MRN: [id number]  HPI 68 year old patient who presents with a one-week history of a stye involving the right lateral lower lid.  She has been using warm compresses, but this area continues to be uncomfortable.  She has developed mild bilateral conjunctival injection.  She describes mild matting of the lids in the morning. No fever or other constitutional complaints She has history of hypothyroidism which has been stable  Past Medical History  Diagnosis Date  . Hypothyroid   . Allergy     seasonal  . Scoliosis   . Anxiety     History   Social History  . Marital Status: Divorced    Spouse Name: N/A    Number of Children: 2  . Years of Education: N/A   Occupational History  . CMA    Social History Main Topics  . Smoking status: Never Smoker   . Smokeless tobacco: Never Used  . Alcohol Use: No  . Drug Use: No  . Sexual Activity: Not on file   Other Topics Concern  . Not on file   Social History Narrative    Past Surgical History  Procedure Laterality Date  . Nasal sinus surgery    . Neck lesion biopsy      cysts benign  . Umbilical hernia repair    . Appendectomy    . Tubal ligation      Family History  Problem Relation Age of Onset  . Cancer Mother     colon ca  . Heart disease Mother   . Colon cancer Mother   . Cancer Father   . Thyroid disease Daughter   . Thyroid disease Mother     Allergies  Allergen Reactions  . Guaifenesin & Derivatives Nausea And Vomiting  . Clindamycin/Lincomycin Cross Reactors Other (See Comments)    GI intolerance  . Codeine Other (See Comments)    hallucinations    Current Outpatient Prescriptions on File Prior to Visit  Medication Sig Dispense Refill  . ALPRAZolam (XANAX) 0.5 MG tablet Take 0.5 mg by mouth 2 (two) times daily as needed for anxiety.    Marland Kitchen. levothyroxine (SYNTHROID, LEVOTHROID) 88 MCG tablet TAKE 1 TABLET BY MOUTH EVERY DAY  90 tablet 0   No current facility-administered medications on file prior to visit.    BP 112/70 mmHg  Pulse 61  Temp(Src) 98 F (36.7 C) (Oral)  Resp 20  Ht 5' 4.5" (1.638 m)  Wt 136 lb (61.689 kg)  BMI 22.99 kg/m2  SpO2 97%      Review of Systems  Constitutional: Negative.   HENT: Negative for congestion, dental problem, hearing loss, rhinorrhea, sinus pressure, sore throat and tinnitus.   Eyes: Positive for pain and redness. Negative for discharge and visual disturbance.  Respiratory: Negative for cough and shortness of breath.   Cardiovascular: Negative for chest pain, palpitations and leg swelling.  Gastrointestinal: Negative for nausea, vomiting, abdominal pain, diarrhea, constipation, blood in stool and abdominal distention.  Genitourinary: Negative for dysuria, urgency, frequency, hematuria, flank pain, vaginal bleeding, vaginal discharge, difficulty urinating, vaginal pain and pelvic pain.  Musculoskeletal: Negative for joint swelling, arthralgias and gait problem.  Skin: Negative for rash.  Neurological: Negative for dizziness, syncope, speech difficulty, weakness, numbness and headaches.  Hematological: Negative for adenopathy.  Psychiatric/Behavioral: Negative for behavioral problems, dysphoric mood and agitation. The patient is not nervous/anxious.  Objective:   Physical Exam  Constitutional: She appears well-developed and well-nourished. No distress.  Eyes:  Stye, right lateral lower lid Bilateral conjunctival injection, right greater than the left          Assessment & Plan:   Stye, right eye.  Will continue warm compresses and add antibiotic drops Hypothyroidism

## 2014-06-09 ENCOUNTER — Telehealth: Payer: Self-pay | Admitting: Internal Medicine

## 2014-06-09 NOTE — Telephone Encounter (Signed)
Please see message and advise 

## 2014-06-09 NOTE — Telephone Encounter (Signed)
Noted  

## 2014-06-09 NOTE — Telephone Encounter (Signed)
Called patient and discussed.her start is improved and she just has some residual conjunctival injection.  Will try artificial tears for 24 hours, but will call tomorrow if clinical worsening.  Will hold off on further antibiotic drops at this time

## 2014-06-09 NOTE — Telephone Encounter (Signed)
Pt states the neomycin-polymyxin-hydrocortisone (CORTISPORIN) 3.5-10000-1 ophthalmic suspension is no longer available and no pharm can get. Pt would like to know if there is anoter eye drops dr can rx? [facility] [address]

## 2014-07-18 ENCOUNTER — Other Ambulatory Visit: Payer: Self-pay | Admitting: Internal Medicine

## 2014-08-15 ENCOUNTER — Telehealth: Payer: Self-pay | Admitting: Internal Medicine

## 2014-08-15 ENCOUNTER — Other Ambulatory Visit: Payer: Self-pay | Admitting: Internal Medicine

## 2014-08-15 MED ORDER — LEVOTHYROXINE SODIUM 88 MCG PO TABS: 88.0000 ug | ORAL_TABLET | Freq: Every day | ORAL | Status: DC

## 2014-08-15 NOTE — Telephone Encounter (Signed)
Left detailed message Rx sent to pharmacy as requested. 

## 2014-08-15 NOTE — Telephone Encounter (Signed)
Pt needs refill on levothyroxine 88 mcg #90w/[facility]

## 2014-09-15 ENCOUNTER — Telehealth: Payer: Self-pay | Admitting: Internal Medicine

## 2014-09-15 NOTE — Telephone Encounter (Signed)
Okay to order Chest x-ray?

## 2014-09-15 NOTE — Telephone Encounter (Addendum)
Pt  States she needs chest xray for her employer b/c she cannot do the skin tb test.  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  Pt states the last time dr Kirtland Bouchardk signed a letter stating she was ok and pt did not have to complete the xray. If he is willing to sign again, that would be ok too. Pls advise

## 2014-09-15 NOTE — Telephone Encounter (Signed)
Ok for CXR.

## 2014-09-16 NOTE — Telephone Encounter (Signed)
Spoke to pt, told her can order x-ray. Pt said she already had it taken care of went to urgent care and had one done. Told her okay.

## 2014-10-04 ENCOUNTER — Telehealth: Payer: Self-pay | Admitting: Internal Medicine

## 2014-10-05 ENCOUNTER — Telehealth: Payer: Self-pay | Admitting: Internal Medicine

## 2014-10-05 NOTE — Telephone Encounter (Signed)
Authorization completed -pt scheduled for 10-07-2014  Outpatient Authorization #[id number]  10/07/2014 - 04/05/2015 Dr. Cristal Deerhristopher E. Ezzard StandingNewman, MD  Address: [address], [place], [place] [address]  [phone]

## 2014-10-26 DIAGNOSIS — H10022 Other mucopurulent conjunctivitis, left eye: Secondary | ICD-10-CM | POA: Diagnosis not present

## 2014-11-07 ENCOUNTER — Other Ambulatory Visit: Payer: Self-pay | Admitting: Internal Medicine

## 2014-11-11 ENCOUNTER — Emergency Department (HOSPITAL_COMMUNITY)
Admission: EM | Admit: 2014-11-11 | Discharge: 2014-11-11 | Disposition: A | Discharge status: Home / Self Care | Payer: Commercial Managed Care - HMO | Attending: Emergency Medicine | Admitting: Emergency Medicine

## 2014-11-11 ENCOUNTER — Encounter (HOSPITAL_COMMUNITY): Payer: Self-pay | Admitting: *Deleted

## 2014-11-11 DIAGNOSIS — Z79899 Other long term (current) drug therapy: Secondary | ICD-10-CM | POA: Diagnosis not present

## 2014-11-11 DIAGNOSIS — M545 Low back pain: Secondary | ICD-10-CM | POA: Diagnosis not present

## 2014-11-11 DIAGNOSIS — M419 Scoliosis, unspecified: Secondary | ICD-10-CM | POA: Insufficient documentation

## 2014-11-11 DIAGNOSIS — R350 Frequency of micturition: Secondary | ICD-10-CM | POA: Diagnosis present

## 2014-11-11 DIAGNOSIS — N390 Urinary tract infection, site not specified: Secondary | ICD-10-CM | POA: Insufficient documentation

## 2014-11-11 DIAGNOSIS — F419 Anxiety disorder, unspecified: Secondary | ICD-10-CM | POA: Diagnosis not present

## 2014-11-11 DIAGNOSIS — E039 Hypothyroidism, unspecified: Secondary | ICD-10-CM | POA: Insufficient documentation

## 2014-11-11 DIAGNOSIS — Z792 Long term (current) use of antibiotics: Secondary | ICD-10-CM | POA: Diagnosis not present

## 2014-11-11 LAB — URINALYSIS, ROUTINE W REFLEX MICROSCOPIC
Bilirubin Urine: NEGATIVE
Glucose, UA: NEGATIVE mg/dL
KETONES UR: NEGATIVE mg/dL
Nitrite: NEGATIVE
Protein, ur: 30 mg/dL — AB
Specific Gravity, Urine: 1.004 — ABNORMAL LOW (ref 1.005–1.030)
Urobilinogen, UA: 0.2 mg/dL (ref 0.0–1.0)
pH: 6.5 (ref 5.0–8.0)

## 2014-11-11 LAB — I-STAT CHEM 8, ED
BUN: 17 mg/dL (ref 6–23)
Calcium, Ion: 1.18 mmol/L (ref 1.13–1.30)
Chloride: 102 mmol/L (ref 96–112)
Creatinine, Ser: 0.9 mg/dL (ref 0.50–1.10)
Glucose, Bld: 96 mg/dL (ref 70–99)
HCT: 40 % (ref 36.0–46.0)
Hemoglobin: 13.6 g/dL (ref 12.0–15.0)
Potassium: 3.8 mmol/L (ref 3.5–5.1)
Sodium: 139 mmol/L (ref 135–145)
TCO2: 23 mmol/L (ref 0–100)

## 2014-11-11 LAB — URINE MICROSCOPIC-ADD ON

## 2014-11-11 MED ORDER — PHENAZOPYRIDINE HCL 200 MG PO TABS: 200.0000 mg | ORAL_TABLET | Freq: Three times a day (TID) | ORAL | Status: DC

## 2014-11-11 MED ORDER — CEPHALEXIN 500 MG PO CAPS: 500.0000 mg | ORAL_CAPSULE | Freq: Two times a day (BID) | ORAL | Status: DC

## 2014-11-11 MED ORDER — PHENAZOPYRIDINE HCL 100 MG PO TABS
200.0000 mg | ORAL_TABLET | Freq: Once | ORAL | Status: AC
  Administered 2014-11-11: 200 mg via ORAL
  Filled 2014-11-11: qty 2

## 2014-11-11 NOTE — ED Provider Notes (Signed)
CSN: [id number]     Arrival date & time 11/11/14  0507 History   First MD Initiated Contact with Patient 11/11/14 [phone]     Chief Complaint  Patient presents with  . Urinary Frequency     (Consider location/radiation/quality/duration/timing/severity/associated sxs/prior Treatment) HPI Pt is a 69yo female presenting to ED with c/o sudden onset lower abdominal pain, bilateral lower back pain, urinary frequency and urgency with hematuria that started this morning around 0200.  Lower abdominal cramping is intermittent, 9/10 at worst. Pt reports hx of UTI about 1 year ago and symptoms feel similar.  Pt states she is unable to rest due to the symptoms.  No pain medication PTA. Denies fever, chills, n/v/d. No hx of kidney stones. No vaginal symptoms.   Past Medical History  Diagnosis Date  . Hypothyroid   . Allergy     seasonal  . Scoliosis   . Anxiety    Past Surgical History  Procedure Laterality Date  . Nasal sinus surgery    . Neck lesion biopsy      cysts benign  . Umbilical hernia repair    . Appendectomy    . Tubal ligation     Family History  Problem Relation Age of Onset  . Cancer Mother     colon ca  . Heart disease Mother   . Colon cancer Mother   . Cancer Father   . Thyroid disease Daughter   . Thyroid disease Mother    History  Substance Use Topics  . Smoking status: Never Smoker   . Smokeless tobacco: Never Used  . Alcohol Use: No   OB History    No data available     Review of Systems  Constitutional: Negative for fever, chills, diaphoresis, appetite change and fatigue.  Respiratory: Negative for cough and shortness of breath.   Gastrointestinal: Negative for nausea, vomiting, abdominal pain and diarrhea.  Genitourinary: Positive for dysuria, urgency, frequency, hematuria and flank pain ( bilateral). Negative for decreased urine volume, vaginal bleeding, vaginal discharge and vaginal pain.  Musculoskeletal: Positive for back pain ( bilateral lower back).  Negative for myalgias, neck pain and neck stiffness.  All other systems reviewed and are negative.     Allergies  Guaifenesin & derivatives; Clindamycin/lincomycin cross reactors; and Codeine  Home Medications   Prior to Admission medications   Medication Sig Start Date End Date Taking? Authorizing Provider  levothyroxine (SYNTHROID, LEVOTHROID) 88 MCG tablet TAKE 1 TABLET BY MOUTH EVERY DAY 11/07/14  Yes Gordy SaversPeter F Kwiatkowski, MD  ALPRAZolam [name](XANAX) 0.5 MG tablet TAKE 1 TABLET BY MOUTH TWICE A DAY Patient not taking: Reported on 11/11/2014 07/18/14   Gordy SaversPeter F Kwiatkowski, MD  cephALEXin (KEFLEX) 500 MG capsule Take 1 capsule (500 mg total) by mouth 2 (two) times daily. For 7 days 11/11/14   Junius FinnerErin O'Malley, PA-C  levothyroxine (SYNTHROID, LEVOTHROID) 88 MCG tablet Take 1 tablet (88 mcg total) by mouth daily. Patient not taking: Reported on 11/11/2014 08/15/14   Gordy SaversPeter F Kwiatkowski, MD  neomycin-polymyxin-hydrocortisone (CORTISPORIN) 3.5-10000-1 ophthalmic suspension Place 2 drops into both eyes 4 (four) times daily. Patient not taking: Reported on 11/11/2014 06/07/14   Gordy SaversPeter F Kwiatkowski, MD  phenazopyridine (PYRIDIUM) 200 MG tablet Take 1 tablet (200 mg total) by mouth 3 (three) times daily. 11/11/14   Junius FinnerErin O'Malley, PA-C   BP 165/92 mmHg  Pulse 66  Temp(Src) 97.9 F (36.6 C) (Oral)  Resp 16  Ht 5\' 4"  (1.626 m)  Wt 138 lb 12.8 oz (62.959 kg)  BMI 23.81 kg/m2  SpO2 97% Physical Exam  Constitutional: She appears well-developed and well-nourished. No distress.  Pt sitting on exam bed, appears well, non-toxic. NAD  HENT:  Head: Normocephalic and atraumatic.  Eyes: Conjunctivae are normal. No scleral icterus.  Neck: Normal range of motion.  Cardiovascular: Normal rate, regular rhythm and normal heart sounds.   Pulmonary/Chest: Effort normal and breath sounds normal. No respiratory distress. She has no wheezes. She has no rales. She exhibits no tenderness.  Abdominal: Soft. Bowel sounds are  normal. She exhibits no distension and no mass. There is tenderness (mild, lower abdomen over bladder). There is no rebound, no guarding and no CVA tenderness.  Musculoskeletal: Normal range of motion.  Neurological: She is alert.  Skin: Skin is warm and dry. She is not diaphoretic.  Nursing note and vitals reviewed.   ED Course  Procedures (including critical care time) Labs Review Labs Reviewed  URINALYSIS, ROUTINE W REFLEX MICROSCOPIC - Abnormal; Notable for the following:    Color, Urine RED (*)    APPearance CLOUDY (*)    Specific Gravity, Urine 1.004 (*)    Hgb urine dipstick LARGE (*)    Protein, ur 30 (*)    Leukocytes, UA LARGE (*)    All other components within normal limits  URINE MICROSCOPIC-ADD ON - Abnormal; Notable for the following:    Bacteria, UA FEW (*)    All other components within normal limits  URINE CULTURE  I-STAT CHEM 8, ED    Imaging Review No results found.   EKG Interpretation None      MDM   Final diagnoses:  UTI (lower urinary tract infection)    Pt with hx of UTI, presenting to ED with same. Pt appears well, non-toxic, afebrile. Abdomen is soft, non-distended, mild tenderness over bladder on exam.  Pt has been in and out of her room multiple times to use restroom while waiting on labs.  Labs: UA c/w UTI, urine culture sent.  Chem-8: WNL Discussed pt with Dr. Norlene Campbell, agrees with plan to tx pt with PO antibiotics as IV antibiotics not indicated at this time. Pt request medication to help with dysuria, will give pyridium and advised to use acetaminophen or ibuprofen as needed for pain. Rx: keflex. Home care instructions provided. Advised to f/u with PCP if not improving in 3-4 days. Return precautions provided. Pt verbalized understanding and agreement with tx plan.    Junius Finner, PA-C 11/11/14 0454  Marisa Severin, MD 11/11/14 [phone]

## 2014-11-11 NOTE — ED Notes (Signed)
Patient woke up with pain in her lower abd and around to her back on the left at 0200.  She has frequency and obvious blood in her urine.  No fever   No n/v.  She could not rest due to pain.  Patient has hx of uti

## 2014-11-14 LAB — URINE CULTURE: Colony Count: 75000

## 2014-11-15 ENCOUNTER — Telehealth (HOSPITAL_BASED_OUTPATIENT_CLINIC_OR_DEPARTMENT_OTHER): Payer: Self-pay | Admitting: Emergency Medicine

## 2014-11-15 NOTE — Telephone Encounter (Signed)
Post ED Visit - Positive Culture Follow-up  Culture report reviewed by antimicrobial stewardship pharmacist: []  Julie Duffy, Pharm.D., BCPS [x]  Julie Duffy, Pharm.D., BCPS []  Julie Duffy, Pharm.D., BCPS []  Julie Duffy, [address]D., BCPS, AAHIVP []  Julie Duffy, Pharm.D., BCPS, AAHIVP []  Julie Duffy, [address]D., BCPS  Positive urine culture E. Coli Treated with cephalexin, organism sensitive to the same and no further patient follow-up is required at this time.  Julie Duffy, Julie Duffy 11/15/2014, 10:26 AM

## 2014-11-17 ENCOUNTER — Telehealth: Payer: Self-pay | Admitting: Internal Medicine

## 2014-11-17 NOTE — Telephone Encounter (Signed)
Pt feel like her UTI  Is coming back and has some questions about the medicine she is taking and would like a call back

## 2014-11-17 NOTE — Telephone Encounter (Signed)
Spoke to pt, c/o burning feels like symptoms are coming back. Asked pt if still taking antibiotic and drinking plenty of water? Pt stated has 2 days left of abx and is not drinking a lot of water.  Asked pt if they gave her Pyridium for burning? Pt stated yes, but she only took one. Told pt need to increase water and take Pyridium again to help with burning and if not feeling better by tomorrow morning to call back. Pt verbalized understanding.

## 2014-11-27 ENCOUNTER — Emergency Department (HOSPITAL_COMMUNITY)
Admission: EM | Admit: 2014-11-27 | Discharge: 2014-11-27 | Disposition: A | Discharge status: Home / Self Care | Payer: Commercial Managed Care - HMO | Attending: Emergency Medicine | Admitting: Emergency Medicine

## 2014-11-27 ENCOUNTER — Encounter (HOSPITAL_COMMUNITY): Payer: Self-pay | Admitting: Emergency Medicine

## 2014-11-27 DIAGNOSIS — Z9851 Tubal ligation status: Secondary | ICD-10-CM | POA: Diagnosis not present

## 2014-11-27 DIAGNOSIS — Z8739 Personal history of other diseases of the musculoskeletal system and connective tissue: Secondary | ICD-10-CM | POA: Diagnosis not present

## 2014-11-27 DIAGNOSIS — F419 Anxiety disorder, unspecified: Secondary | ICD-10-CM | POA: Insufficient documentation

## 2014-11-27 DIAGNOSIS — R319 Hematuria, unspecified: Secondary | ICD-10-CM | POA: Diagnosis not present

## 2014-11-27 DIAGNOSIS — Z9889 Other specified postprocedural states: Secondary | ICD-10-CM | POA: Diagnosis not present

## 2014-11-27 DIAGNOSIS — R300 Dysuria: Secondary | ICD-10-CM | POA: Diagnosis present

## 2014-11-27 DIAGNOSIS — E039 Hypothyroidism, unspecified: Secondary | ICD-10-CM | POA: Diagnosis not present

## 2014-11-27 DIAGNOSIS — Z792 Long term (current) use of antibiotics: Secondary | ICD-10-CM | POA: Diagnosis not present

## 2014-11-27 DIAGNOSIS — Z9049 Acquired absence of other specified parts of digestive tract: Secondary | ICD-10-CM | POA: Insufficient documentation

## 2014-11-27 DIAGNOSIS — N390 Urinary tract infection, site not specified: Secondary | ICD-10-CM | POA: Diagnosis not present

## 2014-11-27 DIAGNOSIS — Z79899 Other long term (current) drug therapy: Secondary | ICD-10-CM | POA: Insufficient documentation

## 2014-11-27 LAB — URINALYSIS, ROUTINE W REFLEX MICROSCOPIC
Bilirubin Urine: NEGATIVE
GLUCOSE, UA: NEGATIVE mg/dL
KETONES UR: NEGATIVE mg/dL
Nitrite: NEGATIVE
PROTEIN: 100 mg/dL — AB
Specific Gravity, Urine: 1.013 (ref 1.005–1.030)
UROBILINOGEN UA: 0.2 mg/dL (ref 0.0–1.0)
pH: 5 (ref 5.0–8.0)

## 2014-11-27 LAB — URINE MICROSCOPIC-ADD ON

## 2014-11-27 MED ORDER — CEFTRIAXONE SODIUM 1 G IJ SOLR
1.0000 g | Freq: Once | INTRAMUSCULAR | Status: AC
  Administered 2014-11-27: 1 g via INTRAMUSCULAR
  Filled 2014-11-27: qty 10

## 2014-11-27 MED ORDER — PHENAZOPYRIDINE HCL 95 MG PO TABS: 95.0000 mg | ORAL_TABLET | Freq: Three times a day (TID) | ORAL | Status: DC | PRN

## 2014-11-27 MED ORDER — CEPHALEXIN 500 MG PO CAPS: 500.0000 mg | ORAL_CAPSULE | Freq: Two times a day (BID) | ORAL | Status: DC

## 2014-11-27 MED ORDER — LIDOCAINE HCL (PF) 1 % IJ SOLN
INTRAMUSCULAR | Status: AC
  Administered 2014-11-27: 2 mL
  Filled 2014-11-27: qty 5

## 2014-11-27 MED ORDER — ONDANSETRON 4 MG PO TBDP: 4.0000 mg | ORAL_TABLET | Freq: Three times a day (TID) | ORAL | Status: DC | PRN

## 2014-11-27 NOTE — ED Provider Notes (Signed)
TIME SEEN: 9:30 AM    CHIEF COMPLAINT: "I have a UTI"  HPI:  Pt is a 69 y.o. F with h/o hypothyroidism, frequent UTIs who presents to ED with symptoms of UTI that started early this AM.  Pt reports she has had dysuria, hematuria, urinary frequency and urgency. No urinary retention. Has had some lower abdominal cramping and lower back pain. No fevers or chills. Has had intermittent nausea but none currently. No vomiting or diarrhea. No vaginal bleeding or discharge. States this feels exactly like her prior UTIs. States she is taking Keflex in the past which has helped with her UTIs. States she is received a "shot" of antibiotics in the emergency department in the past and this quickly relieved her symptoms. States she's been drinking increased water recently. Last UTI was the beginning of April.  ROS: See HPI Constitutional: no fever  Eyes: no drainage  ENT: no runny nose   Cardiovascular:  no chest pain  Resp: no SOB  GI: no vomiting GU:  dysuria Integumentary: no rash  Allergy: no hives  Musculoskeletal: no leg swelling  Neurological: no slurred speech ROS otherwise negative  PAST MEDICAL HISTORY/PAST SURGICAL HISTORY:  Past Medical History  Diagnosis Date  . Hypothyroid   . Allergy     seasonal  . Scoliosis   . Anxiety     MEDICATIONS:  Prior to Admission medications   Medication Sig Start Date End Date Taking? Authorizing Provider  ALPRAZolam [name]) 0.5 MG tablet TAKE 1 TABLET BY MOUTH TWICE A DAY Patient not taking: Reported on 11/11/2014 07/18/14   Gordy Savers, MD  cephALEXin (KEFLEX) 500 MG capsule Take 1 capsule (500 mg total) by mouth 2 (two) times daily. For 7 days 11/11/14   Junius Finner, PA-C  levothyroxine (SYNTHROID, LEVOTHROID) 88 MCG tablet Take 1 tablet (88 mcg total) by mouth daily. Patient not taking: Reported on 11/11/2014 08/15/14   Gordy Savers, MD  levothyroxine (SYNTHROID, LEVOTHROID) 88 MCG tablet TAKE 1 TABLET BY MOUTH EVERY DAY 11/07/14   Gordy Savers, MD  neomycin-polymyxin-hydrocortisone (CORTISPORIN) 3.5-10000-1 ophthalmic suspension Place 2 drops into both eyes 4 (four) times daily. Patient not taking: Reported on 11/11/2014 06/07/14   Gordy Savers, MD  phenazopyridine (PYRIDIUM) 200 MG tablet Take 1 tablet (200 mg total) by mouth 3 (three) times daily. 11/11/14   Junius Finner, PA-C    ALLERGIES:  Allergies  Allergen Reactions  . Guaifenesin & Derivatives Nausea And Vomiting  . Clindamycin/Lincomycin Cross Reactors Other (See Comments)    GI intolerance  . Codeine Other (See Comments)    hallucinations    SOCIAL HISTORY:  History  Substance Use Topics  . Smoking status: Never Smoker   . Smokeless tobacco: Never Used  . Alcohol Use: No    FAMILY HISTORY: Family History  Problem Relation Age of Onset  . Cancer Mother     colon ca  . Heart disease Mother   . Colon cancer Mother   . Cancer Father   . Thyroid disease Daughter   . Thyroid disease Mother     EXAM: BP 149/83 mmHg  Pulse 63  Temp(Src) 98.2 F (36.8 C) (Oral)  Resp 18  Ht 5' 4.5" (1.638 m)  Wt 137 lb (62.143 kg)  BMI 23.16 kg/m2  SpO2 97% CONSTITUTIONAL: Alert and oriented and responds appropriately to questions. Well-appearing; well-nourished, nontoxic, pleasant HEAD: Normocephalic EYES: Conjunctivae clear, PERRL ENT: normal nose; no rhinorrhea; moist mucous membranes; pharynx without lesions noted NECK: Supple,  no meningismus, no LAD  CARD: RRR; S1 and S2 appreciated; no murmurs, no clicks, no rubs, no gallops RESP: Normal chest excursion without splinting or tachypnea; breath sounds clear and equal bilaterally; no wheezes, no rhonchi, no rales ABD/GI: Normal bowel sounds; non-distended; soft, non-tender, no rebound, no guarding, no peritoneal signs, no tenderness at McBurney's point, negative Murphy sign BACK:  The back appears normal and is non-tender to palpation, there is no CVA tenderness EXT: Normal ROM in all joints;  non-tender to palpation; no edema; normal capillary refill; no cyanosis    SKIN: Normal color for age and race; warm NEURO: Moves all extremities equally PSYCH: The patient's mood and manner are appropriate. Grooming and personal hygiene are appropriate.  MEDICAL DECISION MAKING: Patient here with history of recurrent UTIs. She frequently grows Escherichia coli that is sensitive to all antibiotics other than ampicillin.  Patient does have large leukocytes and large hemoglobin in her urine. Will give dose of ceftriaxone IM in the ED given she reports this has helped her significantly in the past. Will discharge on Keflex. Discussed return precautions and supportive care instructions. She verbalized understanding and is comfortable with plan.       Layla MawKristen N Firas Guardado, DO 11/27/14 [phone]

## 2014-11-27 NOTE — ED Notes (Addendum)
Diagnosed UTI at beginning of the month; took whole course of antibiotics, finishing about a week ago. For a couple days was better, then started having symptoms again. Burning, low abd pain, bloody urine. States not nearly as severe as last time.

## 2014-11-27 NOTE — Discharge Instructions (Signed)

## 2014-11-28 LAB — URINE CULTURE: Colony Count: 3000

## 2014-12-08 ENCOUNTER — Encounter: Payer: Self-pay | Admitting: Internal Medicine

## 2014-12-08 ENCOUNTER — Ambulatory Visit (INDEPENDENT_AMBULATORY_CARE_PROVIDER_SITE_OTHER): Payer: Commercial Managed Care - HMO | Admitting: Internal Medicine

## 2014-12-08 VITALS — BP 130/80 | HR 76 | Temp 97.7°F | Resp 20 | Ht 64.5 in | Wt 135.0 lb

## 2014-12-08 DIAGNOSIS — N390 Urinary tract infection, site not specified: Secondary | ICD-10-CM

## 2014-12-08 DIAGNOSIS — R319 Hematuria, unspecified: Secondary | ICD-10-CM | POA: Diagnosis not present

## 2014-12-08 LAB — POCT URINALYSIS DIPSTICK
Bilirubin, UA: NEGATIVE
Glucose, UA: NEGATIVE
KETONES UA: NEGATIVE
LEUKOCYTES UA: NEGATIVE
Nitrite, UA: 0.2
PROTEIN UA: NEGATIVE
Spec Grav, UA: 1.01
UROBILINOGEN UA: NEGATIVE
pH, UA: 6.5

## 2014-12-08 NOTE — Progress Notes (Signed)
Pre visit review using our clinic review tool, if applicable. No additional management support is needed unless otherwise documented below in the visit note. 

## 2014-12-08 NOTE — Patient Instructions (Signed)
Urology consultation as discussed  Call or return to clinic prn if these symptoms worsen or fail to improve as anticipated.

## 2014-12-08 NOTE — Progress Notes (Signed)
Subjective:    Patient ID: Julie Duffy, female    DOB: 1945-11-28, 69 y.o.   MRN: [id number]  HPI  69 year old patient who is seen today in follow-up.  She has been seen in the ED recently for hemorrhagic cystitis.  She's had 3 episodes within the past several months.  Symptoms are fairly abrupt and associated with gross hematuria.  No fever, chills or flank pain.  She has been told the past that she may have a cystocele Today she feels well  Past Medical History  Diagnosis Date  . Hypothyroid   . Allergy     seasonal  . Scoliosis   . Anxiety     History   Social History  . Marital Status: Divorced    Spouse Name: N/A  . Number of Children: 2  . Years of Education: N/A   Occupational History  . CMA    Social History Main Topics  . Smoking status: Never Smoker   . Smokeless tobacco: Never Used  . Alcohol Use: No  . Drug Use: No  . Sexual Activity: Not on file   Other Topics Concern  . Not on file   Social History Narrative    Past Surgical History  Procedure Laterality Date  . Nasal sinus surgery    . Neck lesion biopsy      cysts benign  . Umbilical hernia repair    . Appendectomy    . Tubal ligation      Family History  Problem Relation Age of Onset  . Cancer Mother     colon ca  . Heart disease Mother   . Colon cancer Mother   . Cancer Father   . Thyroid disease Daughter   . Thyroid disease Mother     Allergies  Allergen Reactions  . Guaifenesin & Derivatives Nausea And Vomiting  . Clindamycin/Lincomycin Cross Reactors Other (See Comments)    GI intolerance  . Codeine Other (See Comments)    hallucinations    Current Outpatient Prescriptions on File Prior to Visit  Medication Sig Dispense Refill  . levothyroxine (SYNTHROID, LEVOTHROID) 88 MCG tablet TAKE 1 TABLET BY MOUTH EVERY DAY 90 tablet 0   No current facility-administered medications on file prior to visit.    BP 130/80 mmHg  Pulse 76  Temp(Src) 97.7 F (36.5 C) (Oral)   Resp 20  Ht 5' 4.5" (1.638 m)  Wt 135 lb (61.236 kg)  BMI 22.82 kg/m2  SpO2 95%     Review of Systems  Constitutional: Negative.   HENT: Negative for congestion, dental problem, hearing loss, rhinorrhea, sinus pressure, sore throat and tinnitus.   Eyes: Negative for pain, discharge and visual disturbance.  Respiratory: Negative for cough and shortness of breath.   Cardiovascular: Negative for chest pain, palpitations and leg swelling.  Gastrointestinal: Negative for nausea, vomiting, abdominal pain, diarrhea, constipation, blood in stool and abdominal distention.  Genitourinary: Positive for dysuria, urgency, frequency and hematuria. Negative for flank pain, vaginal bleeding, vaginal discharge, difficulty urinating, vaginal pain and pelvic pain.  Musculoskeletal: Negative for joint swelling, arthralgias and gait problem.  Skin: Negative for rash.  Neurological: Negative for dizziness, syncope, speech difficulty, weakness, numbness and headaches.  Hematological: Negative for adenopathy.  Psychiatric/Behavioral: Negative for behavioral problems, dysphoric mood and agitation. The patient is not nervous/anxious.        Objective:   Physical Exam  Constitutional: She is oriented to person, place, and time. She appears well-developed and well-nourished.  HENT:  Head:  Normocephalic.  Right Ear: External ear normal.  Left Ear: External ear normal.  Mouth/Throat: Oropharynx is clear and moist.  Eyes: Conjunctivae and EOM are normal. Pupils are equal, round, and reactive to light.  Neck: Normal range of motion. Neck supple. No thyromegaly present.  Cardiovascular: Normal rate, regular rhythm, normal heart sounds and intact distal pulses.   Pulmonary/Chest: Effort normal and breath sounds normal.  Abdominal: Soft. Bowel sounds are normal. She exhibits no mass. There is tenderness.  Very mild suprapubic tenderness  Musculoskeletal: Normal range of motion.  Lymphadenopathy:    She has no  cervical adenopathy.  Neurological: She is alert and oriented to person, place, and time.  Skin: Skin is warm and dry. No rash noted.  Psychiatric: She has a normal mood and affect. Her behavior is normal.          Assessment & Plan:   Recurrent hemorrhagic UTIs.  Urinalysis normal today.  Patient has completed antibiotic therapy.  We'll set up for urological evaluation Hypothyroidism

## 2014-12-10 ENCOUNTER — Encounter: Payer: Self-pay | Admitting: Internal Medicine

## 2014-12-26 ENCOUNTER — Encounter: Payer: Self-pay | Admitting: Internal Medicine

## 2015-01-19 DIAGNOSIS — N760 Acute vaginitis: Secondary | ICD-10-CM | POA: Diagnosis not present

## 2015-01-30 ENCOUNTER — Other Ambulatory Visit: Payer: Self-pay

## 2015-02-27 DIAGNOSIS — N3020 Other chronic cystitis without hematuria: Secondary | ICD-10-CM | POA: Insufficient documentation

## 2015-02-27 DIAGNOSIS — N952 Postmenopausal atrophic vaginitis: Secondary | ICD-10-CM | POA: Diagnosis not present

## 2015-02-27 DIAGNOSIS — R8290 Unspecified abnormal findings in urine: Secondary | ICD-10-CM | POA: Diagnosis not present

## 2015-02-27 DIAGNOSIS — N72 Inflammatory disease of cervix uteri: Secondary | ICD-10-CM | POA: Diagnosis not present

## 2015-02-27 DIAGNOSIS — N3021 Other chronic cystitis with hematuria: Secondary | ICD-10-CM | POA: Diagnosis not present

## 2015-02-27 DIAGNOSIS — R310 Gross hematuria: Secondary | ICD-10-CM | POA: Insufficient documentation

## 2015-02-27 DIAGNOSIS — N362 Urethral caruncle: Secondary | ICD-10-CM | POA: Diagnosis not present

## 2015-03-17 DIAGNOSIS — N952 Postmenopausal atrophic vaginitis: Secondary | ICD-10-CM | POA: Diagnosis not present

## 2015-03-17 DIAGNOSIS — N3021 Other chronic cystitis with hematuria: Secondary | ICD-10-CM | POA: Diagnosis not present

## 2015-03-17 DIAGNOSIS — R310 Gross hematuria: Secondary | ICD-10-CM | POA: Diagnosis not present

## 2015-03-17 DIAGNOSIS — N362 Urethral caruncle: Secondary | ICD-10-CM | POA: Insufficient documentation

## 2015-04-17 ENCOUNTER — Other Ambulatory Visit: Payer: Self-pay | Admitting: Internal Medicine

## 2015-04-28 ENCOUNTER — Encounter (HOSPITAL_COMMUNITY): Payer: Self-pay | Admitting: Emergency Medicine

## 2015-04-28 DIAGNOSIS — F419 Anxiety disorder, unspecified: Secondary | ICD-10-CM | POA: Insufficient documentation

## 2015-04-28 DIAGNOSIS — E039 Hypothyroidism, unspecified: Secondary | ICD-10-CM | POA: Diagnosis not present

## 2015-04-28 DIAGNOSIS — Y999 Unspecified external cause status: Secondary | ICD-10-CM | POA: Diagnosis not present

## 2015-04-28 DIAGNOSIS — Y92410 Unspecified street and highway as the place of occurrence of the external cause: Secondary | ICD-10-CM | POA: Insufficient documentation

## 2015-04-28 DIAGNOSIS — Z79899 Other long term (current) drug therapy: Secondary | ICD-10-CM | POA: Diagnosis not present

## 2015-04-28 DIAGNOSIS — R51 Headache: Secondary | ICD-10-CM | POA: Diagnosis not present

## 2015-04-28 DIAGNOSIS — Z8739 Personal history of other diseases of the musculoskeletal system and connective tissue: Secondary | ICD-10-CM | POA: Insufficient documentation

## 2015-04-28 DIAGNOSIS — Y9389 Activity, other specified: Secondary | ICD-10-CM | POA: Insufficient documentation

## 2015-04-28 DIAGNOSIS — S0990XA Unspecified injury of head, initial encounter: Secondary | ICD-10-CM | POA: Insufficient documentation

## 2015-04-28 DIAGNOSIS — R110 Nausea: Secondary | ICD-10-CM | POA: Diagnosis not present
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
## 2015-04-29 ENCOUNTER — Emergency Department (HOSPITAL_COMMUNITY)
Admission: EM | Admit: 2015-04-29 | Discharge: 2015-04-29 | Disposition: A | Discharge status: Home / Self Care | Payer: Commercial Managed Care - HMO | Attending: Emergency Medicine | Admitting: Emergency Medicine

## 2015-04-29 ENCOUNTER — Emergency Department (HOSPITAL_COMMUNITY): Payer: Commercial Managed Care - HMO

## 2015-04-29 DIAGNOSIS — R110 Nausea: Secondary | ICD-10-CM | POA: Diagnosis not present

## 2015-04-29 DIAGNOSIS — Z79899 Other long term (current) drug therapy: Secondary | ICD-10-CM | POA: Diagnosis not present

## 2015-04-29 DIAGNOSIS — R51 Headache: Secondary | ICD-10-CM | POA: Diagnosis not present

## 2015-04-29 DIAGNOSIS — F419 Anxiety disorder, unspecified: Secondary | ICD-10-CM | POA: Diagnosis not present

## 2015-04-29 DIAGNOSIS — E039 Hypothyroidism, unspecified: Secondary | ICD-10-CM | POA: Diagnosis not present

## 2015-04-29 DIAGNOSIS — S0990XA Unspecified injury of head, initial encounter: Secondary | ICD-10-CM | POA: Diagnosis not present

## 2015-04-29 DIAGNOSIS — Z8739 Personal history of other diseases of the musculoskeletal system and connective tissue: Secondary | ICD-10-CM | POA: Diagnosis not present
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
## 2015-04-29 NOTE — ED Notes (Signed)
Pt up to ambulate in hall to the restroom.  Gait steady and even.
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
## 2015-05-01 ENCOUNTER — Telehealth: Payer: Self-pay | Admitting: Internal Medicine
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
## 2015-05-01 NOTE — Telephone Encounter (Signed)
Julie Duffy, could pt not come in tomorrow?

## 2015-05-01 NOTE — Telephone Encounter (Signed)
I offered her an appointment tomorrow but she could not come in. Wednesday was good for her.

## 2015-05-02 NOTE — Telephone Encounter (Signed)
Noted  

## 2015-05-03 ENCOUNTER — Ambulatory Visit (INDEPENDENT_AMBULATORY_CARE_PROVIDER_SITE_OTHER): Payer: Commercial Managed Care - HMO | Admitting: Internal Medicine

## 2015-05-03 ENCOUNTER — Encounter: Payer: Self-pay | Admitting: Internal Medicine

## 2015-05-03 VITALS — BP 150/90 | HR 62 | Temp 97.9°F | Resp 20 | Ht 64.5 in | Wt 135.5 lb

## 2015-05-03 DIAGNOSIS — M549 Dorsalgia, unspecified: Secondary | ICD-10-CM

## 2015-05-03 DIAGNOSIS — R51 Headache: Secondary | ICD-10-CM

## 2015-05-03 DIAGNOSIS — R519 Headache, unspecified: Secondary | ICD-10-CM

## 2015-05-03 DIAGNOSIS — E890 Postprocedural hypothyroidism: Secondary | ICD-10-CM | POA: Diagnosis not present

## 2015-05-03 MED ORDER — ALPRAZOLAM 0.5 MG PO TABS
0.5000 mg | ORAL_TABLET | Freq: Two times a day (BID) | ORAL | Status: DC | PRN
Start: 2015-05-03 — End: 2015-08-28

## 2015-05-03 NOTE — Progress Notes (Signed)
Pre visit review using our clinic review tool, if applicable. No additional management support is needed unless otherwise documented below in the visit note. 

## 2015-05-03 NOTE — Patient Instructions (Signed)
Most patients with neck and  back pain will improve with time over the next two to 6 weeks.  Keep active but avoid any activities that cause pain.  Apply moist heat to the upper back and neck area several times daily.  Call or return to clinic prn if these symptoms worsen or fail to improve as anticipated.
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
## 2015-05-06 ENCOUNTER — Emergency Department (HOSPITAL_COMMUNITY)
Admission: EM | Admit: 2015-05-06 | Discharge: 2015-05-06 | Disposition: A | Discharge status: Home / Self Care | Payer: Commercial Managed Care - HMO | Attending: Emergency Medicine | Admitting: Emergency Medicine

## 2015-05-06 ENCOUNTER — Encounter (HOSPITAL_COMMUNITY): Payer: Self-pay | Admitting: Emergency Medicine

## 2015-05-06 ENCOUNTER — Emergency Department (HOSPITAL_COMMUNITY): Payer: Commercial Managed Care - HMO

## 2015-05-06 DIAGNOSIS — M542 Cervicalgia: Secondary | ICD-10-CM | POA: Insufficient documentation

## 2015-05-06 DIAGNOSIS — M25511 Pain in right shoulder: Secondary | ICD-10-CM | POA: Insufficient documentation

## 2015-05-06 DIAGNOSIS — F419 Anxiety disorder, unspecified: Secondary | ICD-10-CM | POA: Insufficient documentation

## 2015-05-06 DIAGNOSIS — S138XXA Sprain of joints and ligaments of other parts of neck, initial encounter: Secondary | ICD-10-CM | POA: Diagnosis not present

## 2015-05-06 DIAGNOSIS — S134XXD Sprain of ligaments of cervical spine, subsequent encounter: Secondary | ICD-10-CM | POA: Insufficient documentation

## 2015-05-06 DIAGNOSIS — E039 Hypothyroidism, unspecified: Secondary | ICD-10-CM | POA: Insufficient documentation

## 2015-05-06 DIAGNOSIS — G44309 Post-traumatic headache, unspecified, not intractable: Secondary | ICD-10-CM

## 2015-05-06 DIAGNOSIS — R51 Headache: Secondary | ICD-10-CM | POA: Diagnosis not present

## 2015-05-06 DIAGNOSIS — Z79899 Other long term (current) drug therapy: Secondary | ICD-10-CM | POA: Insufficient documentation

## 2015-05-06 DIAGNOSIS — M25512 Pain in left shoulder: Secondary | ICD-10-CM | POA: Diagnosis not present

## 2015-05-06 DIAGNOSIS — S139XXD Sprain of joints and ligaments of unspecified parts of neck, subsequent encounter: Secondary | ICD-10-CM

## 2015-05-06 DIAGNOSIS — R6884 Jaw pain: Secondary | ICD-10-CM | POA: Diagnosis present

## 2015-05-06 MED ORDER — ORPHENADRINE CITRATE ER 100 MG PO TB12: 100.0000 mg | ORAL_TABLET | Freq: Two times a day (BID) | ORAL | Status: DC

## 2015-05-06 MED ORDER — IBUPROFEN 600 MG PO TABS: 600.0000 mg | ORAL_TABLET | Freq: Four times a day (QID) | ORAL | Status: DC | PRN

## 2015-05-06 MED ORDER — HYDROCODONE-ACETAMINOPHEN 5-325 MG PO TABS
1.0000 | ORAL_TABLET | Freq: Once | ORAL | Status: AC
Start: 2015-05-06 — End: 2015-05-06
  Administered 2015-05-06: 1 via ORAL
  Filled 2015-05-06: qty 1

## 2015-05-06 MED ORDER — IBUPROFEN 200 MG PO TABS
400.0000 mg | ORAL_TABLET | Freq: Once | ORAL | Status: AC
  Administered 2015-05-06: 400 mg via ORAL
  Filled 2015-05-06: qty 2

## 2015-05-06 MED ORDER — HYDROCODONE-ACETAMINOPHEN 5-325 MG PO TABS: 1.0000 | ORAL_TABLET | ORAL | Status: DC | PRN
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
## 2015-05-06 NOTE — Discharge Instructions (Signed)
Post-Concussion Syndrome Post-concussion syndrome describes the symptoms that can occur after a head injury. These symptoms can last from weeks to months. CAUSES  It is not clear why some head injuries cause post-concussion syndrome. It can occur whether your head injury was mild or severe and whether you were wearing head protection or not.  SIGNS AND SYMPTOMS  Memory difficulties.  Dizziness.  Headaches.  Double vision or blurry vision.  Sensitivity to light.  Hearing difficulties.  Depression.  Tiredness.  Weakness.  Difficulty with concentration.  Difficulty sleeping or staying asleep.  Vomiting.  Poor balance or instability on your feet.  Slow reaction time.  Difficulty learning and remembering things you have heard. DIAGNOSIS  There is no test to determine whether you have post-concussion syndrome. Your health care provider may order an imaging scan of your brain, such as a CT scan, to check for other problems that may be causing your symptoms (such as severe injury inside your skull). TREATMENT  Usually, these problems disappear over time without medical care. Your health care provider may prescribe medicine to help ease your symptoms. It is important to follow up with a neurologist to evaluate your recovery and address any lingering symptoms or issues. HOME CARE INSTRUCTIONS   Only take over-the-counter or prescription medicines for pain, discomfort, or fever as directed by your health care provider. Do not take aspirin. Aspirin can slow blood clotting.  Sleep with your head slightly elevated to help with headaches.  Avoid any situation where there is potential for another head injury (football, hockey, soccer, basketball, martial arts, downhill snow sports, and horseback riding). Your condition will get worse every time you experience a concussion. You should avoid these activities until you are evaluated by the appropriate follow-up health care  providers.  Keep all follow-up appointments as directed by your health care provider. SEEK IMMEDIATE MEDICAL CARE IF:  You develop confusion or unusual drowsiness.  You cannot wake the injured person.  You develop nausea or persistent, forceful vomiting.  You feel like you are moving when you are not (vertigo).  You notice the injured person's eyes moving rapidly back and forth. This may be a sign of vertigo.  You have convulsions or faint.  You have severe, persistent headaches that are not relieved by medicine.  You cannot use your arms or legs normally.  Your pupils change size.  You have clear or bloody discharge from the nose or ears.  Your problems are getting worse, not better. MAKE SURE YOU: Understand these instructions. Emergency Department Resource Guide 1) Find a Doctor and Pay Out of Pocket Although you won't have to find out who is covered by your insurance plan, it is a good idea to ask around and get recommendations. You will then need to call the office and see if the doctor you have chosen will accept you as a new patient and what types of options they offer for patients who are self-pay. Some doctors offer discounts or will set up payment plans for their patients who do not have insurance, but you will need to ask so you aren't surprised when you get to your appointment.  2) Contact Your Local Health Department Not all health departments have doctors that can see patients for sick visits, but many do, so it is worth a call to see if yours does. If you don't know where your local health department is, you can check in your phone book. The CDC also has a tool to help you locate  your state's health department, and many state websites also have listings of all of their local health departments.  3) Find a Walk-in Clinic If your illness is not likely to be very severe or complicated, you may want to try a walk in clinic. These are popping up all over the country in  pharmacies, drugstores, and shopping centers. They're usually staffed by nurse practitioners or physician assistants that have been trained to treat common illnesses and complaints. They're usually fairly quick and inexpensive. However, if you have serious medical issues or chronic medical problems, these are probably not your best option.  No Primary Care Doctor: Call Health Connect at  [phone] - they can help you locate a primary care doctor that  accepts your insurance, provides certain services, etc. Physician Referral Service- [phone]  Chronic Pain Problems: Organization         Address  Phone   Notes  [facility]  [phone] Patients need to be referred by their primary care doctor.   Medication Assistance: Organization         Address  Phone   Notes  [facility] Medication [facility] [address] [place], [place] [address] [phone] --Must be a resident of [facility] -- Must have NO insurance coverage whatsoever (no Medicaid/ Medicare, etc.) -- The pt. MUST have a primary care doctor that directs their care regularly and follows them in the community   [facility]  [phone]   [employer]  [phone]    Agencies that provide inexpensive medical care: Organization         Address  Phone   Notes  [facility]  [phone]   [facility]    [phone]   [facility] [address] [place], [place] [address] [phone]   [facility] [address] [place], [place] [phone]   Planned Parenthood    [phone]   [facility]    [phone]   Community Health and [facility]  [address], [place] Phone:  [phone], Fax:  [phone] Hours of Operation:  9 am - 6 pm, M-F.  Also accepts Medicaid/Medicare and self-pay.  [facility]  [address], [place]  Phone: [phone], Fax: [phone]. Hours of Operation:  8:30 am - 5:30 pm, M-F.  Also accepts Medicaid and self-pay.  [facility] [place] [address], [place] Phone: [phone]   [facility] [address] Natasha Bence [place], [place] [phone], Ext. [phone] Mondays & Thursdays: 7-9 AM.  First 15 patients are seen on a first come, first serve basis.    Medicaid-accepting [facility] Providers:  Organization         Address  Phone   Notes  [facility] [address], [place] [phone] Also accepts self-pay patients.  [facility] [address] Laurell Josephs [place], [place]  [phone]   [facility] [address], [place] [phone]   [facility] [address], [place] [phone]   Renaye Rakers [address], [place]   [phone] Only accepts [place] Access [place] patients after they have their name applied to their card.   Self-Pay (no insurance) in [facility]:  Organization  Address  Phone   Notes  Sickle Cell Patients, [facility] Internal Medicine [address] [place], [place] [phone]   [facility] [address] [place], [place] [phone]   [facility] [place]  [address],  [phone]   [facility]/Dr. Osei-Bonsu  [address], [place] or [address], [place] [phone] Phone number for both [place] and [place] locations is the same.  [facility]gent [facility] and [facility] [address] [place] [phone]   [facility] [address] [place] or [address] [phone]   [facility] [address], [place] [phone], phone; [phone], fax Sees patients 1st and 3rd Saturday of every month.  Must not qualify for  public or private insurance (i.e. Medicaid, Medicare, [place] Health Choice, Veterans' Benefits)  Household income should be no more than 200% of the poverty level The clinic cannot treat you if you are pregnant or think you are pregnant  Sexually transmitted diseases are not treated at the clinic.    Dental Care: Organization         Address  Phone  Notes  [facility] Department of [facility] [address] [place], [place] [phone] Accepts children up to age 102 who are enrolled in [place] or [place] Health Choice; pregnant women with a Medicaid card; and children who have applied for Medicaid or [place] Health Choice, but were declined, whose parents can pay a reduced fee at time of service.  [place] [facility] of [facility]  [address], [place] [phone] Accepts children up to age 28 who are enrolled in [place] or [place] Health Choice; pregnant women with a Medicaid card; and children who have applied for Medicaid or  Health Choice, but were declined, whose parents can pay a reduced fee at time of service.  [facility]  [address] [place], [place] [phone] Patients are seen by appointment only. Walk-ins are not accepted. [facility] will see patients 75 years of age and older. Monday - Tuesday (8am-5pm) Most Wednesdays (8:30-5pm) $30 per visit, cash only  [facility] Adult Dental Access PRO[facility]  [address], [facility] [phone] Patients are seen by appointment only. Walk-ins are not accepted. [facility] will see patients 2 years of age and older. One Wednesday Evening (Monthly: Volunteer Based).  $30 per visit, cash only  [employer]  [phone] for adults; Children under age 44, call Graduate Pediatric Dentistry at [phone]. Children aged 39-14, please call [phone] to request a pediatric application.  Dental services are provided in all areas  of dental care including fillings, crowns and bridges, complete and partial dentures, implants, gum treatment, root canals, and extractions. Preventive care is also provided. Treatment is provided to both adults and children. Patients are selected via a lottery and there is often a waiting list.   [facility] [address] [place]  [phone] [email or web address]   [facility] [address] [place], [place] [phone], Ext. [phone] Second and Fourth Thursday of each month, opens at 6:30 AM; Clinic ends at 9 AM.  Patients are seen on a first-come first-served basis, and a limited number are seen during each clinic.   [facility]  [address] [place], [place]  [place], [place] [phone]   Eligibility Requirements You must have lived in [place], [place], or [place] counties for at least the last three months.   You cannot be eligible for state or federal sponsored [place], including [employer], [place], or [employer].   You generally cannot be eligible for healthcare insurance through your employer.    How to apply: Eligibility screenings are held every Tuesday and Wednesday afternoon from 1:00 pm until 4:00 pm. You do not need an appointment for the interview!  [facility] [address] [place], [place] [phone]   [facility] Health Department  [phone]   [facility] Health Department  [phone]   [facility] Health Department  [phone]    Behavioral Health Resources in the Community: Intensive Outpatient Programs Organization         Address  Phone  Notes  [facility] [address], [place], [place] [phone]   [facility] [address], [place], [place] [phone]   [facility] [address], [place], [place]  [phone]   [facility] Mental Health [address],  [place], [place] [phone] or  [phone]   Substance Abuse Resources Organization         Address  Phone  Notes  [facility]  [phone]   [facility]  [phone]   [facility]  [phone]   Floydene Flock  [phone]   Residential & Outpatient Substance Abuse Program  [phone]   Psychological Services Organization         Address  Phone  Notes  [facility] Health  [phone]   [facility] Services  [phone]   [facility] Mental Health [address] [place] [phone] or [phone]    Mobile Crisis Teams Organization         Address  Phone  Notes  [facility], Mobile Crisis Care Unit  [phone]   [facility]  [address] [place], [place] [phone]   Doristine Locks [address] [place] [phone]    Self-Help/Support Groups Organization         Address  Phone             Notes  [facility] [place] - variety of support groups  336- [phone] Call for more information  Narcotics Anonymous (NA), [facility] [address], [employer] [place]  2 meetings at this location   Statistician         Address  Phone  Notes  [facility] [address],    [place]  [phone]   [facility]  [address], [place] [address], [employer], [place] [phone]   [facility] Treatment [facility] [address] [place], [place] [phone] Admissions: 8am-3pm M-F  [facility] [address]    [place], [place] [phone]   [facility] [address] Starling Manns [place], [place] [phone]   [facility] [place] [address]  [place], [place] [phone]   [facility] - Intensive Outpatient [address] Laurell Josephs [address], [place], [place] [phone]   [facility] ([facility] [address] [place]  [place], [place] [phone] or [phone]   Residential  Treatment Services (RTS) [address] [place], [place] [phone] Accepts Medicaid  [facility] [place] [address],  [place] [phone] Substance Abuse/Addiction Treatment   [facility] Resources Organization  Address  Phone  Notes  [facility]  [phone]   Angie Fava, PhD [address] Ervin Knack [place], [place]   [phone] or [phone]   [facility] Behavioral   [address] [place], [place] [phone]   [facility] [address] [place], [place] [phone] Insurance/Medicaid/sponsorship through [facility] [address]                                    [place], [place] [phone] Therapy/tele-psych/case  [facility] [address], [place] [phone]    Dr. Lolly Mustache  [phone]   [facility]  [employer] [facility] Dept. 1) [address] [place] 2) [address] [place] 3)  [address] [place] [address], [place] [phone]  [phone]   [place] Child Abuse Hotline 719-422-8245 or (505) 751-8180 (After Hours)        Will watch your condition.  Will get help right away if you are not doing well or get worse. Document Released: 01/11/2002 Document Revised: 05/12/2013 Document Reviewed: 10/27/2013 [facility] Patient Information 2015 [place], [place]. This information is not intended to replace advice given to you by your health care provider. Make sure you discuss any questions you have with your health care provider. Cervical Sprain A cervical sprain is an injury in the neck in which the strong, fibrous tissues (ligaments) that connect your neck bones stretch or tear. Cervical sprains can range from mild to severe. Severe cervical sprains can cause the neck vertebrae to be unstable. This can lead to damage of the spinal cord and can result in serious nervous system problems. The amount of time  it takes for a cervical sprain to get better depends on the cause and extent of the injury. Most cervical sprains heal in 1 to 3 weeks. CAUSES  Severe cervical sprains may be caused by:   Contact sport injuries (such as from football, rugby, wrestling, hockey, auto racing, gymnastics, diving, martial arts, or boxing).   Motor vehicle collisions.   Whiplash injuries. This is an injury from a sudden forward and backward whipping movement of the head and neck.  Falls.  Mild cervical sprains may be caused by:   Being in an awkward position, such as while cradling a telephone between your ear and shoulder.   Sitting in a chair that does not offer proper support.   Working at a poorly Marketing executive station.   Looking up or down for long periods of time.  SYMPTOMS   Pain, soreness, stiffness, or a burning sensation in the front, back, or sides of the neck. This discomfort may develop immediately after the injury or slowly, 24 hours or more after the injury.   Pain or tenderness directly in the middle of the back of the neck.   Shoulder or upper back pain.   Limited ability to move the neck.   Headache.   Dizziness.   Weakness, numbness, or tingling in the hands or arms.   Muscle spasms.   Difficulty swallowing or chewing.   Tenderness and swelling of the neck.  DIAGNOSIS  Most of the time your health care provider can diagnose a cervical sprain by taking your history and doing a physical exam. Your health care provider will ask about previous neck injuries and  any known neck problems, such as arthritis in the neck. X-rays may be taken to find out if there are any other problems, such as with the bones of the neck. Other tests, such as a CT scan or MRI, may also be needed.  TREATMENT  Treatment depends on the severity of the cervical sprain. Mild sprains can be treated with rest, keeping the neck in place (immobilization), and pain medicines. Severe cervical  sprains are immediately immobilized. Further treatment is done to help with pain, muscle spasms, and other symptoms and may include:  Medicines, such as pain relievers, numbing medicines, or muscle relaxants.   Physical therapy. This may involve stretching exercises, strengthening exercises, and posture training. Exercises and improved posture can help stabilize the neck, strengthen muscles, and help stop symptoms from returning.  HOME CARE INSTRUCTIONS   Put ice on the injured area.   Put ice in a plastic bag.   Place a towel between your skin and the bag.   Leave the ice on for 15-20 minutes, 3-4 times a day.   If your injury was severe, you may have been given a cervical collar to wear. A cervical collar is a two-piece collar designed to keep your neck from moving while it heals.  Do not remove the collar unless instructed by your health care provider.  If you have long hair, keep it outside of the collar.  Ask your health care provider before making any adjustments to your collar. Minor adjustments may be required over time to improve comfort and reduce pressure on your chin or on the back of your head.  Ifyou are allowed to remove the collar for cleaning or bathing, follow your health care provider's instructions on how to do so safely.  Keep your collar clean by wiping it with mild soap and water and drying it completely. If the collar you have been given includes removable pads, remove them every 1-2 days and hand wash them with soap and water. Allow them to air dry. They should be completely dry before you wear them in the collar.  If you are allowed to remove the collar for cleaning and bathing, wash and dry the skin of your neck. Check your skin for irritation or sores. If you see any, tell your health care provider.  Do not drive while wearing the collar.   Only take over-the-counter or prescription medicines for pain, discomfort, or fever as directed by your health  care provider.   Keep all follow-up appointments as directed by your health care provider.   Keep all physical therapy appointments as directed by your health care provider.   Make any needed adjustments to your workstation to promote good posture.   Avoid positions and activities that make your symptoms worse.   Warm up and stretch before being active to help prevent problems.  SEEK MEDICAL CARE IF:   Your pain is not controlled with medicine.   You are unable to decrease your pain medicine over time as planned.   Your activity level is not improving as expected.  SEEK IMMEDIATE MEDICAL CARE IF:   You develop any bleeding.  You develop stomach upset.  You have signs of an allergic reaction to your medicine.   Your symptoms get worse.   You develop new, unexplained symptoms.   You have numbness, tingling, weakness, or paralysis in any part of your body.  MAKE SURE YOU:   Understand these instructions.  Will watch your condition.  Will get help right  away if you are not doing well or get worse. Document Released: 05/19/2007 Document Revised: 07/27/2013 Document Reviewed: 01/27/2013 [facility] Patient Information 2015 [place], [place]. This information is not intended to replace advice given to you by your health care provider. Make sure you discuss any questions you have with your health care provider.

## 2015-05-16 ENCOUNTER — Encounter: Payer: Self-pay | Admitting: Internal Medicine

## 2015-05-16 ENCOUNTER — Ambulatory Visit (INDEPENDENT_AMBULATORY_CARE_PROVIDER_SITE_OTHER): Payer: Commercial Managed Care - HMO | Admitting: Internal Medicine

## 2015-05-16 VITALS — BP 140/80 | HR 68 | Temp 98.4°F | Resp 20 | Ht 64.5 in | Wt 136.0 lb

## 2015-05-16 DIAGNOSIS — J069 Acute upper respiratory infection, unspecified: Secondary | ICD-10-CM | POA: Diagnosis not present

## 2015-05-16 NOTE — Progress Notes (Signed)
Subjective:    Patient ID: Julie Duffy, female    DOB: 1946/03/17, 69 y.o.   MRN: [id number]  HPI  69 year old patient who presents with a 2 day history of sore throat, hoarseness, nonproductive cough and headache.  She has been using Advil and Robitussin and also getting some relief with salt water gargle.  No fever or chills.  No wheezing or shortness of breath.  [place]  Past Medical History  Diagnosis Date  . Hypothyroid   . Allergy     seasonal  . Scoliosis   . Anxiety     Social History   Social History  . Marital Status: Divorced    Spouse Name: N/A  . Number of Children: 2  . Years of Education: N/A   Occupational History  . CMA    Social History Main Topics  . Smoking status: Never Smoker   . Smokeless tobacco: Never Used  . Alcohol Use: No  . Drug Use: No  . Sexual Activity: Not on file   Other Topics Concern  . Not on file   Social History Narrative    Past Surgical History  Procedure Laterality Date  . Nasal sinus surgery    . Neck lesion biopsy      cysts benign  . Umbilical hernia repair    . Appendectomy    . Tubal ligation      Family History  Problem Relation Age of Onset  . Cancer Mother     colon ca  . Heart disease Mother   . Colon cancer Mother   . Cancer Father   . Thyroid disease Daughter   . Thyroid disease Mother     Allergies  Allergen Reactions  . Guaifenesin & Derivatives Nausea And Vomiting  . Clindamycin/Lincomycin Cross Reactors Other (See Comments)    GI intolerance  . Codeine Other (See Comments)    hallucinations    Current Outpatient Prescriptions on File Prior to Visit  Medication Sig Dispense Refill  . ALPRAZolam (XANAX) 0.5 MG tablet Take 1 tablet (0.5 mg total) by mouth 2 (two) times daily as needed for anxiety or sleep. 60 tablet 2  . Aspirin-Acetaminophen (GOODY BODY PAIN) 500-325 MG PACK Take 1 packet by mouth every 6 (six) hours as needed (headache).    Marland Kitchen ibuprofen (ADVIL,MOTRIN) 600 MG tablet  Take 1 tablet (600 mg total) by mouth every 6 (six) hours as needed. 30 tablet 0  . levothyroxine (SYNTHROID, LEVOTHROID) 88 MCG tablet TAKE 1 TABLET BY MOUTH EVERY DAY 90 tablet 0   No current facility-administered medications on file prior to visit.    BP 140/80 mmHg  Pulse 68  Temp(Src) 98.4 F (36.9 C) (Oral)  Resp 20  Ht 5' 4.5" (1.638 m)  Wt 136 lb (61.689 kg)  BMI 22.99 kg/m2  SpO2 95%     Review of Systems  Constitutional: Positive for activity change, appetite change and fatigue.  HENT: Positive for congestion, sinus pressure, sore throat and voice change. Negative for dental problem, hearing loss, rhinorrhea and tinnitus.   Eyes: Negative for pain, discharge and visual disturbance.  Respiratory: Positive for cough. Negative for shortness of breath.   Cardiovascular: Negative for chest pain, palpitations and leg swelling.  Gastrointestinal: Negative for nausea, vomiting, abdominal pain, diarrhea, constipation, blood in stool and abdominal distention.  Genitourinary: Negative for dysuria, urgency, frequency, hematuria, flank pain, vaginal bleeding, vaginal discharge, difficulty urinating, vaginal pain and pelvic pain.  Musculoskeletal: Negative for joint swelling, arthralgias and gait  problem.  Skin: Negative for rash.  Neurological: Negative for dizziness, syncope, speech difficulty, weakness, numbness and headaches.  Hematological: Negative for adenopathy.  Psychiatric/Behavioral: Negative for behavioral problems, dysphoric mood and agitation. The patient is not nervous/anxious.        Objective:   Physical Exam  Constitutional: She is oriented to person, place, and time. She appears well-developed and well-nourished.  HENT:  Head: Normocephalic.  Right Ear: External ear normal.  Left Ear: External ear normal.  Mild erythema of the oropharynx  Eyes: Conjunctivae and EOM are normal. Pupils are equal, round, and reactive to light.  Neck: Normal range of motion.  Neck supple. No thyromegaly present.  Cardiovascular: Normal rate, regular rhythm, normal heart sounds and intact distal pulses.   Pulmonary/Chest: Effort normal and breath sounds normal.  Abdominal: Soft. Bowel sounds are normal. She exhibits no mass. There is no tenderness.  Musculoskeletal: Normal range of motion.  Lymphadenopathy:    She has no cervical adenopathy.  Neurological: She is alert and oriented to person, place, and time.  Skin: Skin is warm and dry. No rash noted.  Psychiatric: She has a normal mood and affect. Her behavior is normal.          Assessment & Plan:  Viral URI with cough and pharyngitis.  Will treat symptomatically

## 2015-05-16 NOTE — Progress Notes (Signed)
Pre visit review using our clinic review tool, if applicable. No additional management support is needed unless otherwise documented below in the visit note. 

## 2015-05-16 NOTE — Patient Instructions (Signed)
Acute bronchitis symptoms for less than 10 days are generally not helped by antibiotics.  Take over-the-counter expectorants and cough medications such as  Mucinex DM.  Call if there is no improvement in 5 to 7 days or if  you develop worsening cough, fever, or new symptoms, such as shortness of breath or chest pain.  HOME CARE INSTRUCTIONS  Get plenty of rest.  Drink enough fluids to keep your urine clear or pale yellow (unless you have a medical condition that requires fluid restriction). Increasing fluids may help thin your respiratory secretions (sputum) and reduce chest congestion, and it will prevent dehydration.  Take medicines only as directed by your health care provider.   Avoid smoking and secondhand smoke. Exposure to cigarette smoke or irritating chemicals will make bronchitis worse. If you are a smoker, consider using nicotine gum or skin patches to help control withdrawal symptoms. Quitting smoking will help your lungs heal faster.  Reduce the chances of another bout of acute bronchitis by washing your hands frequently, avoiding people with cold symptoms, and trying not to touch your hands to your mouth, nose, or eyes.   

## 2015-05-23 DIAGNOSIS — H5210 Myopia, unspecified eye: Secondary | ICD-10-CM | POA: Diagnosis not present

## 2015-05-29 DIAGNOSIS — H35372 Puckering of macula, left eye: Secondary | ICD-10-CM | POA: Diagnosis not present

## 2015-05-29 DIAGNOSIS — H2513 Age-related nuclear cataract, bilateral: Secondary | ICD-10-CM | POA: Diagnosis not present

## 2015-05-31 ENCOUNTER — Encounter: Payer: Self-pay | Admitting: Internal Medicine

## 2015-05-31 ENCOUNTER — Ambulatory Visit (INDEPENDENT_AMBULATORY_CARE_PROVIDER_SITE_OTHER): Payer: Commercial Managed Care - HMO | Admitting: Internal Medicine

## 2015-05-31 VITALS — BP 136/90 | Temp 98.1°F | Ht 64.5 in | Wt 133.0 lb

## 2015-05-31 DIAGNOSIS — R300 Dysuria: Secondary | ICD-10-CM | POA: Diagnosis not present

## 2015-05-31 DIAGNOSIS — R350 Frequency of micturition: Secondary | ICD-10-CM

## 2015-05-31 DIAGNOSIS — R319 Hematuria, unspecified: Secondary | ICD-10-CM

## 2015-05-31 DIAGNOSIS — N3001 Acute cystitis with hematuria: Secondary | ICD-10-CM

## 2015-05-31 LAB — POCT URINALYSIS DIPSTICK
Bilirubin, UA: NEGATIVE
Glucose, UA: NEGATIVE
Ketones, UA: NEGATIVE
NITRITE UA: NEGATIVE
PH UA: 5.5
Protein, UA: 1
RBC UA: 3
Spec Grav, UA: >=1.03
UROBILINOGEN UA: 0.2

## 2015-05-31 MED ORDER — CEFUROXIME AXETIL 500 MG PO TABS: 500.0000 mg | ORAL_TABLET | Freq: Two times a day (BID) | ORAL | Status: DC

## 2015-05-31 NOTE — Patient Instructions (Signed)
Cystitis   probably  All from uti .  Will notify you  of culture   when available.  If not better sometimes need to change  Antibiotic .

## 2015-05-31 NOTE — Progress Notes (Signed)
Pre visit review using our clinic review tool, if applicable. No additional management support is needed unless otherwise documented below in the visit note.  Chief Complaint  Patient presents with  . Dysuria  . Hematuria  . Urinary Frequency    HPI: Patient Julie Duffy  comes in today for SDA for  new problem evaluation. PCP NA onset Poss 2 days ago and now hematuria  Bright red.  Dysuria and hematuria .  No fever and flank pain .  "Too many" bladder infections. Has seen dr Earlene Plater in past and has been on premarin  cream in past    Neg cysto  ROS: See pertinent positives and negatives per HPI.  Past Medical History  Diagnosis Date  . Hypothyroid   . Allergy     seasonal  . Scoliosis   . Anxiety     Family History  Problem Relation Age of Onset  . Cancer Mother     colon ca  . Heart disease Mother   . Colon cancer Mother   . Cancer Father   . Thyroid disease Daughter   . Thyroid disease Mother     Social History   Social History  . Marital Status: Divorced    Spouse Name: N/A  . Number of Children: 2  . Years of Education: N/A   Occupational History  . CMA    Social History Main Topics  . Smoking status: Never Smoker   . Smokeless tobacco: Never Used  . Alcohol Use: No  . Drug Use: No  . Sexual Activity: Not Asked   Other Topics Concern  . None   Social History Narrative    Outpatient Prescriptions Prior to Visit  Medication Sig Dispense Refill  . ALPRAZolam (XANAX) 0.5 MG tablet Take 1 tablet (0.5 mg total) by mouth 2 (two) times daily as needed for anxiety or sleep. 60 tablet 2  . Aspirin-Acetaminophen (GOODY BODY PAIN) 500-325 MG PACK Take 1 packet by mouth every 6 (six) hours as needed (headache).    Marland Kitchen ibuprofen (ADVIL,MOTRIN) 600 MG tablet Take 1 tablet (600 mg total) by mouth every 6 (six) hours as needed. 30 tablet 0  . levothyroxine (SYNTHROID, LEVOTHROID) 88 MCG tablet TAKE 1 TABLET BY MOUTH EVERY DAY 90 tablet 0   No  facility-administered medications prior to visit.     EXAM:  BP 136/90 mmHg  Temp(Src) 98.1 F (36.7 C) (Oral)  Ht 5' 4.5" (1.638 m)  Wt 133 lb (60.328 kg)  BMI 22.48 kg/m2  Body mass index is 22.48 kg/(m^2).  GENERAL: vitals reviewed and listed above, alert, oriented, appears well hydrated and in no acute distress HEENT: atraumatic, conjunctiva  clear, no obvious abnormalities on inspection of external nose and ears. Abdomen:  Sof,t normal bowel sounds without hepatosplenomegaly, no guarding rebound or masses; tender suprapubic  no CVA tenderness PSYCH: pleasant and cooperative, no obvious depression or anxiety  ASSESSMENT AND PLAN:  Discussed the following assessment and plan:  Acute cystitis with hematuria  Dysuria - Plan: Culture, Urine, POC Urinalysis Dipstick  Hematuria - Plan: Culture, Urine, POC Urinalysis Dipstick  Urinary frequency - Plan: Culture, Urine, POC Urinalysis Dipstick May be allergic to sulfa not sure   Continue prevention  Fu pcp or uro if needed  -Patient advised to return or notify health care team  if symptoms worsen ,persist or new concerns arise.  Patient Instructions  Cystitis   probably  All from uti .  Will notify you  of culture  when available.  If not better sometimes need to change  Antibiotic .      Neta MendsWanda K. [place] Sorn M.D.

## 2015-06-02 LAB — URINE CULTURE

## 2015-06-05 NOTE — Progress Notes (Signed)
Quick Note:  Tell patient that urine culture shows e coli . Should resolve with current treatment .FU or change medication if not better. ______

## 2015-06-09 ENCOUNTER — Ambulatory Visit: Payer: Commercial Managed Care - HMO | Admitting: Adult Health

## 2015-06-16 ENCOUNTER — Ambulatory Visit (INDEPENDENT_AMBULATORY_CARE_PROVIDER_SITE_OTHER): Payer: Commercial Managed Care - HMO | Admitting: Family Medicine

## 2015-06-16 ENCOUNTER — Telehealth: Payer: Self-pay | Admitting: Internal Medicine

## 2015-06-16 ENCOUNTER — Encounter: Payer: Self-pay | Admitting: Family Medicine

## 2015-06-16 VITALS — BP 134/73 | HR 68 | Temp 98.4°F | Ht 64.5 in | Wt 135.0 lb

## 2015-06-16 DIAGNOSIS — R309 Painful micturition, unspecified: Secondary | ICD-10-CM

## 2015-06-16 DIAGNOSIS — N390 Urinary tract infection, site not specified: Secondary | ICD-10-CM | POA: Diagnosis not present

## 2015-06-16 LAB — POCT URINALYSIS DIPSTICK
BILIRUBIN UA: NEGATIVE
GLUCOSE UA: NEGATIVE
KETONES UA: NEGATIVE
NITRITE UA: NEGATIVE
PH UA: 6
Protein, UA: NEGATIVE
Spec Grav, UA: 1.015
Urobilinogen, UA: 0.2

## 2015-06-16 MED ORDER — CIPROFLOXACIN HCL 500 MG PO TABS: 500.0000 mg | ORAL_TABLET | Freq: Two times a day (BID) | ORAL | Status: DC

## 2015-06-16 NOTE — Progress Notes (Signed)
   Subjective:    Patient ID: Julie Duffy, female    DOB: 1945/08/18, 69 y.o.   MRN: [id number]  HPI Here for 3 days of urinary urgency and burning. She was treated for an E coli UTI  afew weeks ago with Ceftin. The symptoms went away for awhile but returned 3 days ago.    Review of Systems  Constitutional: Negative.   Respiratory: Negative.   Cardiovascular: Negative.   Genitourinary: Positive for dysuria, urgency and frequency. Negative for flank pain.       Objective:   Physical Exam  Constitutional: She appears well-developed and well-nourished.  Cardiovascular: Normal rate, regular rhythm, normal heart sounds and intact distal pulses.   Pulmonary/Chest: Effort normal and breath sounds normal.  Abdominal: Soft. Bowel sounds are normal. She exhibits no distension and no mass. There is no tenderness. There is no rebound and no guarding.          Assessment & Plan:  Recurrent UTI. We will send for another culture today. Treat with Cipro.

## 2015-06-16 NOTE — Telephone Encounter (Signed)
Pt said she another UTI and is asking if the following med can be refill. She saw Dr Fabian SharpPanosh in Oct for a UTI    cefUROXime (CEFTIN) 500 MG tablet   [facility]

## 2015-06-16 NOTE — Telephone Encounter (Signed)
Pt scheduled  

## 2015-06-16 NOTE — Progress Notes (Signed)
Pre visit review using our clinic review tool, if applicable. No additional management support is needed unless otherwise documented below in the visit note. 

## 2015-06-16 NOTE — Telephone Encounter (Signed)
Julie MaxwellCheryl, pt will need to be seen and evaluated. I can not order antibiotic. Please schedule an appt.

## 2015-06-18 LAB — URINE CULTURE
Colony Count: NO GROWTH
Organism ID, Bacteria: NO GROWTH

## 2015-06-20 NOTE — Telephone Encounter (Signed)
error 

## 2015-07-09 ENCOUNTER — Other Ambulatory Visit: Payer: Self-pay | Admitting: Internal Medicine

## 2015-07-25 ENCOUNTER — Encounter: Payer: Self-pay | Admitting: Family Medicine

## 2015-07-25 ENCOUNTER — Ambulatory Visit (INDEPENDENT_AMBULATORY_CARE_PROVIDER_SITE_OTHER): Payer: Commercial Managed Care - HMO | Admitting: Family Medicine

## 2015-07-25 VITALS — BP 140/68 | HR 67 | Temp 98.6°F | Wt 136.0 lb

## 2015-07-25 DIAGNOSIS — R300 Dysuria: Secondary | ICD-10-CM

## 2015-07-25 LAB — POCT URINALYSIS DIPSTICK
Bilirubin, UA: NEGATIVE
Glucose, UA: NEGATIVE
Ketones, UA: NEGATIVE
Nitrite, UA: NEGATIVE
PROTEIN UA: NEGATIVE
SPEC GRAV UA: 1.015
UROBILINOGEN UA: 1
pH, UA: 6

## 2015-07-25 MED ORDER — NITROFURANTOIN MONOHYD MACRO 100 MG PO CAPS: 100.0000 mg | ORAL_CAPSULE | Freq: Two times a day (BID) | ORAL | Status: DC

## 2015-07-25 NOTE — Patient Instructions (Addendum)
Urine symptoms are concerning for UTI  Last time you had symptoms like this, your urine actually did not grow bacteria  For this reason, sent in antibiotics but do not want you to take them unless worsening symptoms or blood in your urine as has occurred in past.   We should have results back by Friday

## 2015-07-25 NOTE — Progress Notes (Signed)
Tana Conch, MD  Subjective:  Julie Duffy is a 69 y.o. year old very pleasant female patient who presents for/with See problem oriented charting ROS- no fever, chills, nausea, vomiting. Denies CVA tenderness  Past Medical History-  Patient Active Problem List   Diagnosis Date Noted  . Hypotension, unspecified 04/12/2014  . UTI (lower urinary tract infection) 09/13/2012  . Nausea and vomiting 09/13/2012  . Paresthesia and pain of extremity 09/02/2011  . RHINITIS 01/25/2010  . Backache 10/06/2009  . SINUSITIS, ACUTE 07/19/2009  . Headache 10/04/2008  . Acute upper respiratory infection 09/13/2008  . VITILIGO 01/25/2008  . Hypothyroidism 07/16/2007    Medications- reviewed and updated Current Outpatient Prescriptions  Medication Sig Dispense Refill  . ALPRAZolam (XANAX) 0.5 MG tablet Take 1 tablet (0.5 mg total) by mouth 2 (two) times daily as needed for anxiety or sleep. 60 tablet 2  . Aspirin-Acetaminophen (GOODY BODY PAIN) 500-325 MG PACK Take 1 packet by mouth every 6 (six) hours as needed (headache).    Marland Kitchen ibuprofen (ADVIL,MOTRIN) 600 MG tablet Take 1 tablet (600 mg total) by mouth every 6 (six) hours as needed. (Patient not taking: Reported on 06/16/2015) 30 tablet 0  . levothyroxine (SYNTHROID, LEVOTHROID) 88 MCG tablet TAKE 1 TABLET BY MOUTH EVERY DAY 90 tablet 1  . nitrofurantoin, macrocrystal-monohydrate, (MACROBID) 100 MG capsule Take 1 capsule (100 mg total) by mouth 2 (two) times daily. 14 capsule 0   No current facility-administered medications for this visit.    Objective: BP 140/68 mmHg  Pulse 67  Temp(Src) 98.6 F (37 C)  Wt 136 lb (61.689 kg) Gen: NAD, resting comfortably CV: RRR no murmurs rubs or gallops Lungs: CTAB no crackles, wheeze, rhonchi Abdomen: soft/mild suprapubic pain/nondistended/normal bowel sounds. No rebound or guarding.  Mild left CVA pain Ext: no edema Skin: warm, dry, no rash over abdomen  Results for orders placed or performed  in visit on 07/25/15 (from the past 24 hour(s))  POC Urinalysis Dipstick     Status: Abnormal   Collection Time: 07/25/15  4:10 PM  Result Value Ref Range   Color, UA yellow    Clarity, UA cloudy    Glucose, UA n    Bilirubin, UA n    Ketones, UA n    Spec Grav, UA 1.015    Blood, UA 3+    pH, UA 6.0    Protein, UA n    Urobilinogen, UA 1.0    Nitrite, UA n    Leukocytes, UA moderate (2+) (A) Negative   Assessment/Plan:  Burning with urination - Plan: POC Urinalysis Dipstick, Culture, Urine S: Burning with urination for 24 hours. Some urinary odor 2 days ago which resolved. States she just feels slightly sore in vaginal area which happens at times due to vaginal atrophy. Mild polyuria. No hematuria. Some suprapubic pain. No treatments tried. Had some cipro at home and considered taking this.  A/P: Symptoms and UA concerning for UTI. Last Urine culture had no growth though within a month. Symptoms only 24 hours though. For this reason, provided antibiotics but we will attempt to wait this out- she may take if worsening symptoms or typically hematuria that happens for her (none this time).   Return precautions advised.   Orders Placed This Encounter  Procedures  . Culture, Urine  . POC Urinalysis Dipstick   Meds ordered this encounter  Medications  . nitrofurantoin, macrocrystal-monohydrate, (MACROBID) 100 MG capsule    Sig: Take 1 capsule (100 mg total) by mouth  2 (two) times daily.    Dispense:  14 capsule    Refill:  0

## 2015-07-27 LAB — URINE CULTURE

## 2015-08-01 ENCOUNTER — Encounter: Payer: Self-pay | Admitting: Family Medicine

## 2015-08-01 NOTE — Telephone Encounter (Signed)
Patient Name: Julie Duffy DOB: 05/12/1946 Initial Comment Caller states she is on antibiotic for UTI but not getting better Nurse Assessment Nurse: Yetta BarreJones, RN, Miranda Date/Time (Eastern Time): 08/01/2015 11:26:27 AM Confirm and document reason for call. If symptomatic, describe symptoms. ---Caller states she has been on antibiotics for UTI for 1 week. She is still have abdominal cramping, urinary frequency. Has the patient traveled out of the country within the last 30 days? ---Not Applicable Does the patient have any new or worsening symptoms? ---Yes Will a triage be completed? ---Yes Related visit to physician within the last 2 weeks? ---Yes Does the PT have any chronic conditions? (i.e. diabetes, asthma, etc.) ---Yes List chronic conditions. ---Thyroid Is this a behavioral health or substance abuse call? ---No Guidelines Guideline Title Affirmed Question Affirmed Notes Urinary Tract Infection on Antibiotic Follow-up Call - Female [1] Taking antibiotic > 72 hours (3 days) for UTI AND [2] painful urination or frequency not improved Final Disposition User See Physician within 24 Hours Yetta BarreJones, RN, Miranda Comments Appt scheduled at 10:30 am tomorrow 12/28 with Dr. Selena BattenKim Referrals REFERRED TO PCP OFFICE Disagree/Comply: Comply

## 2015-08-02 ENCOUNTER — Ambulatory Visit (INDEPENDENT_AMBULATORY_CARE_PROVIDER_SITE_OTHER): Payer: Commercial Managed Care - HMO | Admitting: Family Medicine

## 2015-08-02 ENCOUNTER — Other Ambulatory Visit (HOSPITAL_COMMUNITY)
Admission: RE | Admit: 2015-08-02 | Discharge: 2015-08-02 | Disposition: A | Discharge status: Home / Self Care | Payer: Commercial Managed Care - HMO | Source: Ambulatory Visit | Attending: Family Medicine | Admitting: Family Medicine

## 2015-08-02 ENCOUNTER — Encounter: Payer: Self-pay | Admitting: Family Medicine

## 2015-08-02 VITALS — BP 120/88 | HR 71 | Temp 97.6°F | Ht 64.5 in | Wt 136.1 lb

## 2015-08-02 DIAGNOSIS — R300 Dysuria: Secondary | ICD-10-CM

## 2015-08-02 DIAGNOSIS — Z113 Encounter for screening for infections with a predominantly sexual mode of transmission: Secondary | ICD-10-CM | POA: Diagnosis not present

## 2015-08-02 DIAGNOSIS — N760 Acute vaginitis: Secondary | ICD-10-CM

## 2015-08-02 LAB — POCT URINALYSIS DIPSTICK
BILIRUBIN UA: NEGATIVE
GLUCOSE UA: NEGATIVE
KETONES UA: NEGATIVE
Nitrite, UA: NEGATIVE
Protein, UA: NEGATIVE
RBC UA: NEGATIVE
SPEC GRAV UA: 1.015
Urobilinogen, UA: 0.2
pH, UA: 7

## 2015-08-02 NOTE — Progress Notes (Signed)
Pre visit review using our clinic review tool, if applicable. No additional management support is needed unless otherwise documented below in the visit note. 

## 2015-08-02 NOTE — Progress Notes (Signed)
HPI:  Acute visit for dysuria: -seen by Dr. Durene CalHunter 12/20 and treated with macrobid for klebsiella uti on day 6/7 -reports: frequency, burning with urination and some mild vulvovaginal irritation -denies:fevers, chills, malaise, flank pain, NVD, hematuria, sig change in vag discharge  ROS: See pertinent positives and negatives per HPI.  Past Medical History  Diagnosis Date  . Hypothyroid   . Allergy     seasonal  . Scoliosis   . Anxiety     Past Surgical History  Procedure Laterality Date  . Nasal sinus surgery    . Neck lesion biopsy      cysts benign  . Umbilical hernia repair    . Appendectomy    . Tubal ligation      Family History  Problem Relation Age of Onset  . Cancer Mother     colon ca  . Heart disease Mother   . Colon cancer Mother   . Cancer Father   . Thyroid disease Daughter   . Thyroid disease Mother     Social History   Social History  . Marital Status: Divorced    Spouse Name: N/A  . Number of Children: 2  . Years of Education: N/A   Occupational History  . CMA    Social History Main Topics  . Smoking status: Never Smoker   . Smokeless tobacco: Never Used  . Alcohol Use: No  . Drug Use: No  . Sexual Activity: Not Asked   Other Topics Concern  . None   Social History Narrative     Current outpatient prescriptions:  .  ALPRAZolam (XANAX) 0.5 MG tablet, Take 1 tablet (0.5 mg total) by mouth 2 (two) times daily as needed for anxiety or sleep., Disp: 60 tablet, Rfl: 2 .  Aspirin-Acetaminophen (GOODY BODY PAIN) 500-325 MG PACK, Take 1 packet by mouth every 6 (six) hours as needed (headache)., Disp: , Rfl:  .  ibuprofen (ADVIL,MOTRIN) 600 MG tablet, Take 1 tablet (600 mg total) by mouth every 6 (six) hours as needed., Disp: 30 tablet, Rfl: 0 .  levothyroxine (SYNTHROID, LEVOTHROID) 88 MCG tablet, TAKE 1 TABLET BY MOUTH EVERY DAY, Disp: 90 tablet, Rfl: 1 .  nitrofurantoin, macrocrystal-monohydrate, (MACROBID) 100 MG capsule, Take 1  capsule (100 mg total) by mouth 2 (two) times daily., Disp: 14 capsule, Rfl: 0  EXAM:  Filed Vitals:   08/02/15 1031  BP: 120/88  Pulse: 71  Temp: 97.6 F (36.4 C)    Body mass index is 23.01 kg/(m^2).  GENERAL: vitals reviewed and listed above, alert, oriented, appears well hydrated and in no acute distress  HEENT: atraumatic, conjunttiva clear, no obvious abnormalities on inspection of external nose and ears  NECK: no obvious masses on inspection  LUNGS: clear to auscultation bilaterally, no wheezes, rales or rhonchi, good air movement  CV: HRRR, no peripheral edema  ABD: soft, NTTP, no CVA TTP  GU: normal appearance external genitalia, normal vaginal exam with normal vaginal discharge, no CMT  MS: moves all extremities without noticeable abnormality  PSYCH: pleasant and cooperative, no obvious depression or anxiety  ASSESSMENT AND PLAN:  Discussed the following assessment and plan:  Dysuria - Plan: POC Urinalysis Dipstick, Culture, Urine  Vaginitis and vulvovaginitis - Plan: Cervicovaginal ancillary only  -complete abx -culture pending, vaginitis testing pending - treat accordingly -Patient advised to return or notify a doctor immediately if symptoms worsen or persist or new concerns arise.  Patient Instructions  -We have ordered labs or studies at this visit. It  can take up to 1-2 weeks for results and processing. We will contact you with instructions IF your results are abnormal. Normal results will be released to your [facility]. If you have not heard from [place] or can not find your results in [facility] in 2 weeks please contact our office.  -follow up as needed          Mckinzy Fuller R.

## 2015-08-02 NOTE — Patient Instructions (Signed)
-  We have ordered labs or studies at this visit. It can take up to 1-2 weeks for results and processing. We will contact you with instructions IF your results are abnormal. Normal results will be [facility]. If you have not heard from us or can not find your results [facility] in 2 weeks please contact our office.  -follow up as needed

## 2015-08-04 ENCOUNTER — Telehealth: Payer: Self-pay

## 2015-08-04 LAB — URINE CULTURE: Colony Count: 70000

## 2015-08-04 MED ORDER — CIPROFLOXACIN HCL 250 MG PO TABS: 250.0000 mg | ORAL_TABLET | Freq: Two times a day (BID) | ORAL | Status: DC

## 2015-08-04 NOTE — Addendum Note (Signed)
Addended by: Johnella MoloneyFUNDERBURK, JO A on: 08/04/2015 08:45 AM   Modules accepted: Orders

## 2015-08-04 NOTE — Telephone Encounter (Signed)
PLEASE NOTE: All timestamps contained within this report are represented as [place] Standard Time. CONFIDENTIALTY NOTICE: This fax transmission is intended only for the addressee. It contains information that is legally privileged, confidential or otherwise protected from use or disclosure. If you are not the intended recipient, you are strictly prohibited from reviewing, disclosing, copying using or disseminating any of this information or taking any action in reliance on or regarding this information. If you have received this fax in error, please notify us immediately by telephone so that we can arrange for its return to [place]. Phone: [phone], Toll-Free: [phone], Fax: [phone] Page: 1 of 3 Call Id: [id number] [facility] - Client TELEPHONE ADVICE RECORD [facility] Patient Name: Julie Duffy Gender: Female DOB: [birth year] Age: 69 Y 5 M 8 D Return Phone Number: [phone] (Primary) Address: City/State/Zip: [place] [address] Client [facility] - Client Client Site [facility] - Night Physician Kriste Basque Contact Type Call Call Type Triage / Clinical Relationship To Patient Self Return Phone Number [phone] (Primary) Chief Complaint Vaginal Discharge Initial Comment Caller states she is trying to get something ordered for a yeast infection. Just got off an antibiotic PreDisposition Go to ED Nurse Assessment Nurse: Julie Dragon, Julie Duffy, Julie Date/Time Julie Duffy Time): 08/04/2015 3:19:19 AM Confirm and document reason for call. If symptomatic, describe symptoms. ---Caller states she just got off abx for UTI and states she feels as if she has a yeast infection in between her breast and vaginal itching. Has the patient traveled out of the country within the last 30 days? ---No Does the patient have any new or worsening symptoms? ---Yes Will a triage be completed? ---Yes Related visit to  physician within the last 2 weeks? ---Yes Does the PT have any chronic conditions? (i.e. diabetes, asthma, etc.) ---No Is this a behavioral health or substance abuse call? ---No Nurse: Julie Dragon, Julie Duffy, Julie Date/Time (Eastern Time): 08/04/2015 3:26:13 AM Please select the assessment type ---Standing order Other current medications? ---Yes List current medications. ---Levothyroxine Medication allergies? ---Yes List medication allergies. ---sulfa, codeine Pharmacy name and phone number. [phone] Additional Documentation ---[facility] [phone] Guidelines Guideline Title Affirmed Question Affirmed Notes Nurse Date/Time Julie Duffy Time) Vaginal Symptoms MODERATE-SEVERE itching (i.e., interferes Julie Dragon, Julie Duffy, Julie Sago 08/04/2015 3:23:10 AM PLEASE NOTE: All timestamps contained within this report are represented as [place] Standard Time. CONFIDENTIALTY NOTICE: This fax transmission is intended only for the addressee. It contains information that is legally privileged, confidential or otherwise protected from use or disclosure. If you are not the intended recipient, you are strictly prohibited from reviewing, disclosing, copying using or disseminating any of this information or taking any action in reliance on or regarding this information. If you have received this fax in error, please notify us immediately by telephone so that we can arrange for its return to [place]. Phone: [phone], Toll-Free: [phone], Fax: [phone] Page: 2 of 3 Call Id: [id number] Guidelines Guideline Title Affirmed Question Affirmed Notes Nurse Date/Time Julie Duffy Time) with school, work, or sleep) Disp. Time Julie Duffy Time) Disposition Final User 08/04/2015 3:34:49 AM Pharmacy Call Julie Dragon, Julie Duffy, Julie Reason: [facility] [phone] 08/04/2015 3:25:59 AM See Physician within 24 Hours Yes Julie Dragon, Julie Duffy, Karren Cobble Understands: Yes Disagree/Comply: Comply Care Advice Given Per Guideline SEE  PHYSICIAN WITHIN 24 HOURS: * IF OFFICE WILL BE OPEN: You need to be seen within the next 24 hours. Call your doctor when the office opens, and make an  appointment. CLEANSING: Wash the area once thoroughly with un-scented soap and water to remove any irritants. * Take an antihistamine by mouth to reduce the itching. Diphenhydramine (OTC Benadryl) is a good choice. Adult dose is 25-50 mg. Take it up to 4 times a day. ANTIHISTAMINE FOR SEVERE ITCHING: CALL BACK IF: * Fever or severe pain * You become worse. CARE ADVICE given per Vaginal Symptoms (Adult) guideline. After Care Instructions Given Call Event Type User Date / Time Description Standing Orders Preparation Additional Instructions Route Frequency Duration Nurse Comments User Name Diflucan 150 mg 1 tablet Times 1 dose only Oral Julie Dragonarr, Julie Duffy, Julie Duffy Diflucan 150 mg 1 tablet Times 1 dose only Oral Julie Dragonarr, Julie Duffy, Julie Referrals REFERRED TO PCP OFFICE PLEASE NOTE: All timestamps contained within this report are represented as [place] Standard Time. CONFIDENTIALTY NOTICE: This fax transmission is intended only for the addressee. It contains information that is legally privileged, confidential or otherwise protected from use or disclosure. If you are not the intended recipient, you are strictly prohibited from reviewing, disclosing, copying using or disseminating any of this information or taking any action in reliance on or regarding this information. If you have received this fax in error, please notify us immediately by telephone so that we can arrange for its return to us. Phone: [phone], Toll-Free: [phone], Fax: [phone] Page: 3 of 3 Call Id: [id number] [facility] [address] [place] [id number] [phone] Fax: [phone] MEDICATION ORDER [facility] - Client [facility] - Night Date: 08/04/2015 From: [facility]I Department To:  Kriste BasqueKim, Julie Duffy This is an approved standing order given by our call center nurse on your behalf. Fax to [phone] within 5 business days. Thank you. Date Julie Duffy(Eastern Time): 08/04/2015 2:58:32 AM Triage Julie Duffy: Julie PhoSarah Carr, Julie Duffy NAME: Julie CalNORMA Duffy PHONE NUMBER: [phone] (Primary) BIRTHDATE: [birth year] ADDRESS: CITY/STATE/ZIP: [place] [phone] CALLER: Self NAME: Rx Given Preparation Additional Instructions Route Frequency Duration Nurse Comments User Name Diflucan 150 mg 1 tablet Times 1 dose only Oral Julie Dragonarr, Julie Duffy, Julie Duffy Diflucan 150 mg 1 tablet Times 1 dose only Oral Julie Dragonarr, Julie Duffy, Julie No signature is required on standing orders.   Rx called in for pt per note.

## 2015-08-08 LAB — CERVICOVAGINAL ANCILLARY ONLY
Chlamydia: NEGATIVE
Neisseria Gonorrhea: NEGATIVE
Trichomonas: NEGATIVE

## 2015-08-09 ENCOUNTER — Encounter: Payer: Self-pay | Admitting: Family Medicine

## 2015-08-10 ENCOUNTER — Telehealth: Payer: Self-pay | Admitting: Internal Medicine

## 2015-08-10 LAB — CERVICOVAGINAL ANCILLARY ONLY
BACTERIAL VAGINITIS: NEGATIVE
CANDIDA VAGINITIS: NEGATIVE

## 2015-08-10 NOTE — Telephone Encounter (Addendum)
Pt saw dr Selena Battenkim on 08/02/15 and would like someone to explain her UA  results

## 2015-08-10 NOTE — Telephone Encounter (Signed)
Patient informed. 

## 2015-08-10 NOTE — Telephone Encounter (Signed)
She had a urinary tract infection and was treated with an antibiotic. Should follow up with her primary doctor if any persistent symptoms after treatment. Thanks.

## 2015-08-10 NOTE — Telephone Encounter (Signed)
Patient informed of the message below and states she is feeling better.

## 2015-08-28 ENCOUNTER — Other Ambulatory Visit: Payer: Self-pay | Admitting: Internal Medicine

## 2015-09-13 ENCOUNTER — Encounter (HOSPITAL_COMMUNITY): Payer: Self-pay | Admitting: Emergency Medicine

## 2015-09-13 DIAGNOSIS — I159 Secondary hypertension, unspecified: Secondary | ICD-10-CM | POA: Insufficient documentation

## 2015-09-13 DIAGNOSIS — Z793 Long term (current) use of hormonal contraceptives: Secondary | ICD-10-CM | POA: Insufficient documentation

## 2015-09-13 DIAGNOSIS — R030 Elevated blood-pressure reading, without diagnosis of hypertension: Secondary | ICD-10-CM | POA: Diagnosis present

## 2015-09-13 DIAGNOSIS — M419 Scoliosis, unspecified: Secondary | ICD-10-CM | POA: Insufficient documentation

## 2015-09-13 DIAGNOSIS — Z79899 Other long term (current) drug therapy: Secondary | ICD-10-CM | POA: Diagnosis not present

## 2015-09-13 DIAGNOSIS — R202 Paresthesia of skin: Secondary | ICD-10-CM | POA: Insufficient documentation

## 2015-09-13 DIAGNOSIS — E039 Hypothyroidism, unspecified: Secondary | ICD-10-CM | POA: Diagnosis not present

## 2015-09-13 DIAGNOSIS — F419 Anxiety disorder, unspecified: Secondary | ICD-10-CM | POA: Insufficient documentation

## 2015-09-13 DIAGNOSIS — R197 Diarrhea, unspecified: Secondary | ICD-10-CM | POA: Diagnosis not present

## 2015-09-13 LAB — CBC WITH DIFFERENTIAL/PLATELET
BASOS PCT: 1 %
Basophils Absolute: 0 10*3/uL (ref 0.0–0.1)
Eosinophils Absolute: 0.5 10*3/uL (ref 0.0–0.7)
Eosinophils Relative: 6 %
HEMATOCRIT: 40.4 % (ref 36.0–46.0)
HEMOGLOBIN: 13.2 g/dL (ref 12.0–15.0)
LYMPHS ABS: 2.7 10*3/uL (ref 0.7–4.0)
Lymphocytes Relative: 33 %
MCH: 29.1 pg (ref 26.0–34.0)
MCHC: 32.7 g/dL (ref 30.0–36.0)
MCV: 89.2 fL (ref 78.0–100.0)
MONO ABS: 0.5 10*3/uL (ref 0.1–1.0)
MONOS PCT: 6 %
NEUTROS ABS: 4.4 10*3/uL (ref 1.7–7.7)
NEUTROS PCT: 54 %
Platelets: 293 10*3/uL (ref 150–400)
RBC: 4.53 MIL/uL (ref 3.87–5.11)
RDW: 13.2 % (ref 11.5–15.5)
WBC: 8.1 10*3/uL (ref 4.0–10.5)

## 2015-09-13 NOTE — ED Notes (Addendum)
Pt. reports elevated blood pressure at home this evening 161/110 , diarrhea yesterday with tingling at body and face feels warm.

## 2015-09-14 ENCOUNTER — Ambulatory Visit (INDEPENDENT_AMBULATORY_CARE_PROVIDER_SITE_OTHER): Payer: Commercial Managed Care - HMO | Admitting: Family Medicine

## 2015-09-14 ENCOUNTER — Encounter: Payer: Self-pay | Admitting: Family Medicine

## 2015-09-14 ENCOUNTER — Emergency Department (HOSPITAL_COMMUNITY)
Admission: EM | Admit: 2015-09-14 | Discharge: 2015-09-14 | Disposition: A | Discharge status: Home / Self Care | Payer: Commercial Managed Care - HMO | Attending: Emergency Medicine | Admitting: Emergency Medicine

## 2015-09-14 VITALS — BP 140/70 | HR 80 | Temp 97.5°F | Wt 139.8 lb

## 2015-09-14 DIAGNOSIS — Z09 Encounter for follow-up examination after completed treatment for conditions other than malignant neoplasm: Secondary | ICD-10-CM

## 2015-09-14 DIAGNOSIS — I10 Essential (primary) hypertension: Secondary | ICD-10-CM

## 2015-09-14 DIAGNOSIS — I159 Secondary hypertension, unspecified: Secondary | ICD-10-CM

## 2015-09-14 LAB — URINE MICROSCOPIC-ADD ON

## 2015-09-14 LAB — URINALYSIS, ROUTINE W REFLEX MICROSCOPIC
Bilirubin Urine: NEGATIVE
Glucose, UA: NEGATIVE mg/dL
Ketones, ur: NEGATIVE mg/dL
LEUKOCYTES UA: NEGATIVE
NITRITE: NEGATIVE
Protein, ur: NEGATIVE mg/dL
SPECIFIC GRAVITY, URINE: 1.005 (ref 1.005–1.030)
pH: 5 (ref 5.0–8.0)

## 2015-09-14 LAB — T4, FREE: Free T4: 1.06 ng/dL (ref 0.61–1.12)

## 2015-09-14 LAB — COMPREHENSIVE METABOLIC PANEL
ALT: 12 U/L — ABNORMAL LOW (ref 14–54)
ANION GAP: 12 (ref 5–15)
AST: 19 U/L (ref 15–41)
Albumin: 3.7 g/dL (ref 3.5–5.0)
Alkaline Phosphatase: 77 U/L (ref 38–126)
BUN: 14 mg/dL (ref 6–20)
CHLORIDE: 105 mmol/L (ref 101–111)
CO2: 25 mmol/L (ref 22–32)
Calcium: 10 mg/dL (ref 8.9–10.3)
Creatinine, Ser: 1.05 mg/dL — ABNORMAL HIGH (ref 0.44–1.00)
GFR, EST NON AFRICAN AMERICAN: 53 mL/min — AB (ref >60)
Glucose, Bld: 96 mg/dL (ref 65–99)
POTASSIUM: 4.2 mmol/L (ref 3.5–5.1)
Sodium: 142 mmol/L (ref 135–145)
Total Bilirubin: 0.6 mg/dL (ref 0.3–1.2)
Total Protein: 6.4 g/dL — ABNORMAL LOW (ref 6.5–8.1)

## 2015-09-14 LAB — TSH: TSH: 0.528 u[IU]/mL (ref 0.350–4.500)

## 2015-09-14 MED ORDER — AMLODIPINE BESYLATE 5 MG PO TABS
10.0000 mg | ORAL_TABLET | Freq: Once | ORAL | Status: AC
  Administered 2015-09-14: 10 mg via ORAL
  Filled 2015-09-14: qty 2

## 2015-09-14 MED ORDER — AMLODIPINE BESYLATE 10 MG PO TABS: 10.0000 mg | ORAL_TABLET | Freq: Every day | ORAL | Status: DC

## 2015-09-14 MED ORDER — SODIUM CHLORIDE 0.9 % IV BOLUS (SEPSIS)
1000.0000 mL | Freq: Once | INTRAVENOUS | Status: AC
  Administered 2015-09-14: 1000 mL via INTRAVENOUS

## 2015-09-14 NOTE — Progress Notes (Signed)
Subjective:    Patient ID: Julie Duffy, female    DOB: 1945/08/17, 70 y.o.   MRN: [id number]  HPI  Julie Duffy is a 70 year old female who presents today for follow up evaluation of BP after ED visit last night. She states that 4 days ago she woke up feeling hot and took her BP.  She noted a BP of 153/110 and then took a Xanax to lower it because she stated this was a new finding for her. She mentioned one other similar episode which was yesterday. She then went to the ED and was treated and released for follow up with PCP. I am seeing her today because PCP is not available at the time she wanted to be seen.  She denies chest pain, arm pain, numbness, tingling, weakness, headache, dizziness,   In the ED, she received fluids and amlodipine with blood work and an EKG. Her TSH was 0.528, Free T4 1.06, CBC WNL.     Review of Systems  Constitutional: Negative for fever, chills and fatigue.  Respiratory: Negative for cough, chest tightness and shortness of breath.   Cardiovascular: Negative for chest pain, palpitations and leg swelling.  Gastrointestinal: Negative for nausea, vomiting and diarrhea.  Genitourinary: Negative for dysuria and flank pain.  Musculoskeletal: Negative for myalgias and arthralgias.  Skin: Negative for pallor.  Neurological: Negative for dizziness, syncope, light-headedness and headaches.   Past Medical History  Diagnosis Date  . Hypothyroid   . Allergy     seasonal  . Scoliosis   . Anxiety     Social History   Social History  . Marital Status: Divorced    Spouse Name: N/A  . Number of Children: 2  . Years of Education: N/A   Occupational History  . CMA    Social History Main Topics  . Smoking status: Never Smoker   . Smokeless tobacco: Never Used  . Alcohol Use: No  . Drug Use: No  . Sexual Activity: Not on file   Other Topics Concern  . Not on file   Social History Narrative    Past Surgical History  Procedure Laterality Date  . Nasal  sinus surgery    . Neck lesion biopsy      cysts benign  . Umbilical hernia repair    . Appendectomy    . Tubal ligation      Family History  Problem Relation Age of Onset  . Cancer Mother     colon ca  . Heart disease Mother   . Colon cancer Mother   . Cancer Father   . Thyroid disease Daughter   . Thyroid disease Mother     Allergies  Allergen Reactions  . Guaifenesin & Derivatives Nausea And Vomiting  . Clindamycin/Lincomycin Cross Reactors Other (See Comments)    GI intolerance  . Codeine Other (See Comments)    hallucinations    Current Outpatient Prescriptions on File Prior to Visit  Medication Sig Dispense Refill  . ALPRAZolam (XANAX) 0.5 MG tablet Take 1 tablet (0.5 mg total) by mouth 2 (two) times daily as needed for anxiety. 60 tablet 2  . amLODipine (NORVASC) 10 MG tablet Take 1 tablet (10 mg total) by mouth daily. 30 tablet 0  . levothyroxine (SYNTHROID, LEVOTHROID) 88 MCG tablet Take 88-176 mcg by mouth See admin instructions. Take 2 tablets on Sunday then take 1 tablet all the other days     No current facility-administered medications on file prior to visit.  BP 140/70 mmHg  Pulse 80  Temp(Src) 97.5 F (36.4 C) (Oral)  Wt 139 lb 12.8 oz (63.413 kg)  SpO2 97%       Objective:   Physical Exam  Constitutional: She is oriented to person, place, and time. She appears well-developed and well-nourished.  Eyes: Pupils are equal, round, and reactive to light.  Cardiovascular: Normal rate, regular rhythm and intact distal pulses.   Pulmonary/Chest: Effort normal and breath sounds normal. She has no wheezes. She has no rales.  Abdominal: Soft.  Musculoskeletal: She exhibits no edema.  5/5 strength noted in extremities bilaterally  Lymphadenopathy:    She has no cervical adenopathy.  Neurological: She is alert and oriented to person, place, and time.  Skin: Skin is warm and dry. No rash noted. No erythema.  Psychiatric: She has a normal mood and  affect. Her behavior is normal.      Assessment & Plan:  1. Hospital discharge follow-up BP in office WNL. Consulted with PCP and advised patient to decrease amlodipine to 5 mg daily for 2 weeks. Patient advised to monitor BP at home and bring readings with her to the follow up visit with PCP in 2 weeks. Labs from ED visit with ED visit note reviewed. Patient was administered fluids in the ED. Encouraged patient to drink water until her urine is pale yellow or clear. Patient is asymptomatic and reports feeling well since leaving ED.  2. HTN (hypertension) with goal to be determined  Follow up PCP to determine effectiveness of amlodipine. Advised patient to seek medical care immediately if she experiences any chest pain, arm pain, SOB, or dizziness. She voiced understanding and agreed with plan

## 2015-09-14 NOTE — ED Notes (Signed)
EDP at bedside  

## 2015-09-14 NOTE — Discharge Instructions (Signed)
Hypertension Ms. Earl Lites, take amlodipine daily for high blood pressure and see your primary care doctor within 3 days for close follow up.  If any symptoms worsen, come back to the ED immediately.  Thank you. Hypertension is another name for high blood pressure. High blood pressure forces your heart to work harder to pump blood. A blood pressure reading has two numbers, which includes a higher number over a lower number (example: 110/72). HOME CARE   Have your blood pressure rechecked by your doctor.  Only take medicine as told by your doctor. Follow the directions carefully. The medicine does not work as well if you skip doses. Skipping doses also puts you at risk for problems.  Do not smoke.  Monitor your blood pressure at home as told by your doctor. GET HELP IF:  You think you are having a reaction to the medicine you are taking.  You have repeat headaches or feel dizzy.  You have puffiness (swelling) in your ankles.  You have trouble with your vision. GET HELP RIGHT AWAY IF:   You get a very bad headache and are confused.  You feel weak, numb, or faint.  You get chest or belly (abdominal) pain.  You throw up (vomit).  You cannot breathe very well. MAKE SURE YOU:   Understand these instructions.  Will watch your condition.  Will get help right away if you are not doing well or get worse.   This information is not intended to replace advice given to you by your health care provider. Make sure you discuss any questions you have with your health care provider.   Document Released: 01/08/2008 Document Revised: 07/27/2013 Document Reviewed: 05/14/2013 Elsevier Interactive Patient Education [employer]

## 2015-09-14 NOTE — ED Provider Notes (Signed)
CSN: [id number]     Arrival date & time 09/13/15  2223 History  By signing my name below, I, Bethel Born, attest that this documentation has been prepared under the direction and in the presence of Tomasita Crumble, MD. Electronically Signed: Bethel Born, ED Scribe. 09/14/2015. 2:19 AM    Chief Complaint  Patient presents with  . Hypertension  . Diarrhea   The history is provided by the patient. No language interpreter was used.   Julie Duffy is a 70 y.o. female who presents to the Emergency Department complaining of hypertension with onset 4 days ago. Pt states that she woke up four days ago near 4 AM feeling hot. When she checked her blood pressure it was 153/110. She took an Xanax to lower it because her pressures typically run near 90/ 60. The next day she woke up with diarrhea that resolved after Imodium. Yesterday she had a similar episode of feeling hot and notes that her blood pressure was elevated at that time as well. Associated symptoms includes intermittent tingling at the bilateral arms. Pt denies chest pain. She has requested an antihypertensive from her PCP in the past but he would not prescribe one because she has no history of HTN.  Past Medical History  Diagnosis Date  . Hypothyroid   . Allergy     seasonal  . Scoliosis   . Anxiety    Past Surgical History  Procedure Laterality Date  . Nasal sinus surgery    . Neck lesion biopsy      cysts benign  . Umbilical hernia repair    . Appendectomy    . Tubal ligation     Family History  Problem Relation Age of Onset  . Cancer Mother     colon ca  . Heart disease Mother   . Colon cancer Mother   . Cancer Father   . Thyroid disease Daughter   . Thyroid disease Mother    Social History  Substance Use Topics  . Smoking status: Never Smoker   . Smokeless tobacco: Never Used  . Alcohol Use: No   OB History    No data available     Review of Systems  10 Systems reviewed and all are negative for acute  change except as noted in the HPI.  Allergies  Guaifenesin & derivatives; Clindamycin/lincomycin cross reactors; and Codeine  Home Medications   Prior to Admission medications   Medication Sig Start Date End Date Taking? Authorizing Provider  ALPRAZolam [name]) 0.5 MG tablet Take 1 tablet (0.5 mg total) by mouth 2 (two) times daily as needed for anxiety. 08/29/15   Gordy Savers, MD  Aspirin-Acetaminophen (GOODY BODY PAIN) 500-325 MG PACK Take 1 packet by mouth every 6 (six) hours as needed (headache).    Historical Provider, MD  ciprofloxacin (CIPRO) 250 MG tablet Take 1 tablet (250 mg total) by mouth 2 (two) times daily. 08/04/15   Terressa Koyanagi, DO  ibuprofen (ADVIL,MOTRIN) 600 MG tablet Take 1 tablet (600 mg total) by mouth every 6 (six) hours as needed. 05/06/15   Arby Barrette, MD  levothyroxine (SYNTHROID, LEVOTHROID) 88 MCG tablet TAKE 1 TABLET BY MOUTH EVERY DAY 07/10/15   Gordy Savers, MD  nitrofurantoin, macrocrystal-monohydrate, (MACROBID) 100 MG capsule Take 1 capsule (100 mg total) by mouth 2 (two) times daily. 07/25/15   Shelva Majestic, MD   BP 179/73 mmHg  Pulse 57  Temp(Src) 98.1 F (36.7 C) (Oral)  Resp 16  Ht 5'  4.5" (1.638 m)  Wt 139 lb 1 oz (63.078 kg)  BMI 23.51 kg/m2  SpO2 100% Physical Exam  Constitutional: She is oriented to person, place, and time. She appears well-developed and well-nourished. No distress.  HENT:  Head: Normocephalic and atraumatic.  Nose: Nose normal.  Mouth/Throat: Oropharynx is clear and moist. No oropharyngeal exudate.  Eyes: Conjunctivae and EOM are normal. Pupils are equal, round, and reactive to light. No scleral icterus.  Neck: Normal range of motion. Neck supple. No JVD present. No tracheal deviation present. No thyromegaly present.  Cardiovascular: Normal rate, regular rhythm and normal heart sounds.  Exam reveals no gallop and no friction rub.   No murmur heard. Pulmonary/Chest: Effort normal and breath sounds  normal. No respiratory distress. She has no wheezes. She exhibits no tenderness.  Abdominal: Soft. Bowel sounds are normal. She exhibits no distension and no mass. There is no tenderness. There is no rebound and no guarding.  Musculoskeletal: Normal range of motion. She exhibits no edema or tenderness.  Lymphadenopathy:    She has no cervical adenopathy.  Neurological: She is alert and oriented to person, place, and time. No cranial nerve deficit. She exhibits normal muscle tone.  Normal strength and sensation in all extremities. Normal cerebellar testing.   Skin: Skin is warm and dry. No rash noted. No erythema. No pallor.  Nursing note and vitals reviewed.   ED Course  Procedures (including critical care time) DIAGNOSTIC STUDIES: Oxygen Saturation is 100% on RA,  normal by my interpretation.    COORDINATION OF CARE: 2:08 AM Discussed treatment plan which includes lab work, EKG, IVF, and Norvasc with pt at bedside and pt agreed to plan.  Labs Review Labs Reviewed  COMPREHENSIVE METABOLIC PANEL - Abnormal; Notable for the following:    Creatinine, Ser 1.05 (*)    Total Protein 6.4 (*)    ALT 12 (*)    GFR calc non Af Amer 53 (*)    All other components within normal limits  URINALYSIS, ROUTINE W REFLEX MICROSCOPIC (NOT AT [facility]) - Abnormal; Notable for the following:    APPearance CLOUDY (*)    Hgb urine dipstick SMALL (*)    All other components within normal limits  URINE MICROSCOPIC-ADD ON - Abnormal; Notable for the following:    Squamous Epithelial / LPF 0-5 (*)    Bacteria, UA RARE (*)    All other components within normal limits  CBC WITH DIFFERENTIAL/PLATELET  TSH  T4, FREE    Imaging Review No results found. I have personally reviewed and evaluated these lab results as part of my medical decision-making.   EKG Interpretation   Date/Time:  Thursday September 14 2015 02:36:21 EST Ventricular Rate:  58 PR Interval:  178 QRS Duration: 84 QT Interval:  454 QTC  Calculation: 446 R Axis:   62 Text Interpretation:  Sinus rhythm Atrial premature complex Probable left  atrial enlargement No old tracing to compare Confirmed by Erroll Luna [phone]) on 09/14/2015 3:32:02 AM      MDM   Final diagnoses:  None   patient presents emergency department for high blood pressure. She is asymptomatic in terms of hypertensive urgency. She states her blood pressure is normally low, thus her primary care physician is not put her on anything. Her blood pressures remained in the 170s in the emergency department. Will start her on amlodipine 10 mg. She was given IV fluids as well. We'll check her thyroid panel as the patient takes Synthroid daily. We'll  continue to monitor in the emergency department.  Thyroid studies normal.  She continues to be hypertensive in the ED to 150/60.  Will DC home with amlodipine daily and PCP fu.  She appears well and in NAD.  EKG is unremarkable.  VS remain within her normal limits and she is safe for DC.    I personally performed the services described in this documentation, which was scribed in my presence. The recorded information has been reviewed and is accurate.     Tomasita Crumble, MD 09/14/15 [phone]

## 2015-09-14 NOTE — Progress Notes (Signed)
Pre visit review using our clinic review tool, if applicable. No additional management support is needed unless otherwise documented below in the visit note. 

## 2015-09-14 NOTE — Patient Instructions (Signed)
Decrease amlodipine to 5 mg daily. Monitor BP at home and return to clinic for follow up with your PCP. Please bring BP readings with you for this visit. If you have dizziness, lightheadedness, or develop new symptoms, please contact clinic prior to your scheduled appointment for evaluation.  Hypertension Hypertension, commonly called high blood pressure, is when the force of blood pumping through your arteries is too strong. Your arteries are the blood vessels that carry blood from your heart throughout your body. A blood pressure reading consists of a higher number over a lower number, such as 110/72. The higher number (systolic) is the pressure inside your arteries when your heart pumps. The lower number (diastolic) is the pressure inside your arteries when your heart relaxes. Ideally you want your blood pressure below 120/80. Hypertension forces your heart to work harder to pump blood. Your arteries may become narrow or stiff. Having untreated or uncontrolled hypertension can cause heart attack, stroke, kidney disease, and other problems. RISK FACTORS Some risk factors for high blood pressure are controllable. Others are not.  Risk factors you cannot control include:   Race. You may be at higher risk if you are African American.  Age. Risk increases with age.  Gender. Men are at higher risk than women before age 3 years. After age 60, women are at higher risk than men. Risk factors you can control include:  Not getting enough exercise or physical activity.  Being overweight.  Getting too much fat, sugar, calories, or salt in your diet.  Drinking too much alcohol. SIGNS AND SYMPTOMS Hypertension does not usually cause signs or symptoms. Extremely high blood pressure (hypertensive crisis) may cause headache, anxiety, shortness of breath, and nosebleed. DIAGNOSIS To check if you have hypertension, your health care provider will measure your blood pressure while you are seated, with your  arm held at the level of your heart. It should be measured at least twice using the same arm. Certain conditions can cause a difference in blood pressure between your right and left arms. A blood pressure reading that is higher than normal on one occasion does not mean that you need treatment. If it is not clear whether you have high blood pressure, you may be asked to return on a different day to have your blood pressure checked again. Or, you may be asked to monitor your blood pressure at home for 1 or more weeks. TREATMENT Treating high blood pressure includes making lifestyle changes and possibly taking medicine. Living a healthy lifestyle can help lower high blood pressure. You may need to change some of your habits. Lifestyle changes may include:  Following the DASH diet. This diet is high in fruits, vegetables, and whole grains. It is low in salt, red meat, and added sugars.  Keep your sodium intake below 2,300 mg per day.  Getting at least 30-45 minutes of aerobic exercise at least 4 times per week.  Losing weight if necessary.  Not smoking.  Limiting alcoholic beverages.  Learning ways to reduce stress. Your health care provider may prescribe medicine if lifestyle changes are not enough to get your blood pressure under control, and if one of the following is true:  You are 65-65 years of age and your systolic blood pressure is above 140.  You are 4 years of age or older, and your systolic blood pressure is above 150.  Your diastolic blood pressure is above 90.  You have diabetes, and your systolic blood pressure is over 140 or your diastolic  blood pressure is over 90.  You have kidney disease and your blood pressure is above 140/90.  You have heart disease and your blood pressure is above 140/90. Your personal target blood pressure may vary depending on your medical conditions, your age, and other factors. HOME CARE INSTRUCTIONS  Have your blood pressure rechecked as  directed by your health care provider.   Take medicines only as directed by your health care provider. Follow the directions carefully. Blood pressure medicines must be taken as prescribed. The medicine does not work as well when you skip doses. Skipping doses also puts you at risk for problems.  Do not smoke.   Monitor your blood pressure at home as directed by your health care provider. SEEK MEDICAL CARE IF:   You think you are having a reaction to medicines taken.  You have recurrent headaches or feel dizzy.  You have swelling in your ankles.  You have trouble with your vision. SEEK IMMEDIATE MEDICAL CARE IF:  You develop a severe headache or confusion.  You have unusual weakness, numbness, or feel faint.  You have severe chest or abdominal pain.  You vomit repeatedly.  You have trouble breathing. MAKE SURE YOU:   Understand these instructions.  Will watch your condition.  Will get help right away if you are not doing well or get worse.   This information is not intended to replace advice given to you by your health care provider. Make sure you discuss any questions you have with your health care provider.   Document Released: 07/22/2005 Document Revised: 12/06/2014 Document Reviewed: 05/14/2013 Elsevier Interactive Patient Education [employer]

## 2015-09-16 ENCOUNTER — Other Ambulatory Visit: Payer: Self-pay | Admitting: Internal Medicine

## 2015-09-28 ENCOUNTER — Encounter: Payer: Self-pay | Admitting: Internal Medicine

## 2015-09-28 ENCOUNTER — Ambulatory Visit (INDEPENDENT_AMBULATORY_CARE_PROVIDER_SITE_OTHER): Payer: Commercial Managed Care - HMO | Admitting: Internal Medicine

## 2015-09-28 VITALS — BP 152/86 | HR 60 | Temp 98.4°F | Resp 20 | Ht 64.5 in | Wt 140.0 lb

## 2015-09-28 DIAGNOSIS — R350 Frequency of micturition: Secondary | ICD-10-CM | POA: Diagnosis not present

## 2015-09-28 DIAGNOSIS — I10 Essential (primary) hypertension: Secondary | ICD-10-CM

## 2015-09-28 LAB — POC URINALSYSI DIPSTICK (AUTOMATED)
Bilirubin, UA: NEGATIVE
GLUCOSE UA: NEGATIVE
KETONES UA: NEGATIVE
Nitrite, UA: NEGATIVE
PROTEIN UA: NEGATIVE
UROBILINOGEN UA: 0.2
pH, UA: 6

## 2015-09-28 MED ORDER — AMLODIPINE BESYLATE 5 MG PO TABS
5.0000 mg | ORAL_TABLET | Freq: Every day | ORAL | Status: DC
Start: 2015-09-28 — End: 2016-11-01

## 2015-09-28 NOTE — Patient Instructions (Signed)
Limit your sodium (Salt) intake  Please check your blood pressure on a regular basis.  If it is consistently greater than 150/90, please make an office appointment.  Return in 6 months for follow-up   

## 2015-09-28 NOTE — Progress Notes (Signed)
Subjective:    Patient ID: Julie Duffy, female    DOB: 06-Oct-1945, 70 y.o.   MRN: [id number]  HPI  70 year old patient who is seen today for follow-up of newly diagnosed hypertension.  She was seen in the ED with stage II hypertension and initially was prescribed amlodipine 10.  She was seen in office follow-up the same day and amlodipine was decreased to 5 mg daily.  She has tolerated this dose without difficulty.  For the past 2 weeks, she has held the medication 4 times due to low normal.  Readings.  She has remained asymptomatic She also describes some lower abdominal fullness and frequency  Past Medical History  Diagnosis Date  . Hypothyroid   . Allergy     seasonal  . Scoliosis   . Anxiety     Social History   Social History  . Marital Status: Divorced    Spouse Name: N/A  . Number of Children: 2  . Years of Education: N/A   Occupational History  . CMA    Social History Main Topics  . Smoking status: Never Smoker   . Smokeless tobacco: Never Used  . Alcohol Use: No  . Drug Use: No  . Sexual Activity: Not on file   Other Topics Concern  . Not on file   Social History Narrative    Past Surgical History  Procedure Laterality Date  . Nasal sinus surgery    . Neck lesion biopsy      cysts benign  . Umbilical hernia repair    . Appendectomy    . Tubal ligation      Family History  Problem Relation Age of Onset  . Cancer Mother     colon ca  . Heart disease Mother   . Colon cancer Mother   . Cancer Father   . Thyroid disease Daughter   . Thyroid disease Mother     Allergies  Allergen Reactions  . Guaifenesin & Derivatives Nausea And Vomiting  . Clindamycin/Lincomycin Cross Reactors Other (See Comments)    GI intolerance  . Codeine Other (See Comments)    hallucinations    Current Outpatient Prescriptions on File Prior to Visit  Medication Sig Dispense Refill  . ALPRAZolam (XANAX) 0.5 MG tablet Take 1 tablet (0.5 mg total) by mouth 2 (two)  times daily as needed for anxiety. 60 tablet 2  . amLODipine (NORVASC) 10 MG tablet Take 1 tablet (10 mg total) by mouth daily. (Patient taking differently: Take 5 mg by mouth daily. ) 30 tablet 0  . levothyroxine (SYNTHROID, LEVOTHROID) 88 MCG tablet Take 88-176 mcg by mouth See admin instructions. Take 2 tablets on Sunday then take 1 tablet all the other days     No current facility-administered medications on file prior to visit.    BP 152/86 mmHg  Pulse 60  Temp(Src) 98.4 F (36.9 C) (Oral)  Resp 20  Ht 5' 4.5" (1.638 m)  Wt 140 lb (63.504 kg)  BMI 23.67 kg/m2  SpO2 98%     Review of Systems  Constitutional: Negative.   HENT: Negative for congestion, dental problem, hearing loss, rhinorrhea, sinus pressure, sore throat and tinnitus.   Eyes: Negative for pain, discharge and visual disturbance.  Respiratory: Negative for cough and shortness of breath.   Cardiovascular: Negative for chest pain, palpitations and leg swelling.  Gastrointestinal: Negative for nausea, vomiting, abdominal pain, diarrhea, constipation, blood in stool and abdominal distention.  Genitourinary: Positive for frequency. Negative for dysuria, urgency,  hematuria, flank pain, vaginal bleeding, vaginal discharge, difficulty urinating, vaginal pain and pelvic pain.  Musculoskeletal: Negative for joint swelling, arthralgias and gait problem.  Skin: Negative for rash.  Neurological: Negative for dizziness, syncope, speech difficulty, weakness, numbness and headaches.  Hematological: Negative for adenopathy.  Psychiatric/Behavioral: Negative for behavioral problems, dysphoric mood and agitation. The patient is not nervous/anxious.        Objective:   Physical Exam  Constitutional:  Blood pressure 140/84 Blood pressure 152/86 on arrival  Cardiovascular: Normal rate and regular rhythm.   Pulmonary/Chest: Effort normal and breath sounds normal.  Musculoskeletal: She exhibits no edema.          Assessment  & Plan:   Hypertension.  Blood pressure is fluctuating between low normal and high normal range.  We'll continue present regimen Rule out UTI.  Urinalysis reviewed

## 2015-09-28 NOTE — Progress Notes (Signed)
Pre visit review using our clinic review tool, if applicable. No additional management support is needed unless otherwise documented below in the visit note. 

## 2015-10-09 ENCOUNTER — Telehealth: Payer: Self-pay

## 2015-10-09 NOTE — Telephone Encounter (Signed)
Pt has been sch

## 2015-10-09 NOTE — Telephone Encounter (Signed)
Please call pt and schedule appt today or tomorrow.

## 2015-10-09 NOTE — Telephone Encounter (Signed)
[facility] - Client TELEPHONE ADVICE RECORD [facility] Patient Name: Julie Duffy Gender: Female DOB: September 05, 1945 Age: 70 Y 7 M 12 D Return Phone Number: [phone] (Primary) Address: City/State/Zip: [place] [address] Client [facility] - Client Client Site [facility] - Night Physician [place] Contact Type Call Who Is Calling Patient / Member / Family / Caregiver Call Type Triage / Clinical Relationship To Patient Self Return Phone Number [phone] (Primary) Chief Complaint Blood Pressure High Reason for Call Symptomatic / Request for Health Information Initial Comment Caller states all day her blood pressure is low - now it is going up 145/90 PreDisposition Go to ED Translation No Nurse Assessment Nurse: Kizzie Bane, RN, Zella Ball Date/Time (Eastern Time): 10/09/2015 3:29:04 AM Confirm and document reason for call. If symptomatic, describe symptoms. You must click the next button to save text entered. ---Caller states all day her blood pressure has been low all day but now it is going up 145/90. Face is hot, face dry. Has the patient traveled out of the country within the last 30 days? ---No Does the patient have any new or worsening symptoms? ---Yes Will a triage be completed? ---Yes Related visit to physician within the last 2 weeks? ---Yes Does the PT have any chronic conditions? (i.e. diabetes, asthma, etc.) ---Yes List chronic conditions. ---HTN--recent. Is this a behavioral health or substance abuse call? ---No Guidelines Guideline Title Affirmed Question Affirmed Notes Nurse Date/Time (Eastern Time) High Blood Pressure [1] BP # 140/90 AND [2] taking BP medications Kizzie Bane, RN, Zella Ball 10/09/2015 3:31:40 AM Disp. Time Lamount Cohen Time) Disposition Final User 10/09/2015 3:13:45 AM Attempt made - message left Kizzie Bane, RN, Zella Ball 10/09/2015 3:42:56 AM See PCP When Office is  Open (within 3 days) Yes Kizzie Bane, RN, Zella Ball Disposition Overriden: See PCP within 2 Weeks PLEASE NOTE: All timestamps contained within this report are represented as [place] Standard Time. CONFIDENTIALTY NOTICE: This fax transmission is intended only for the addressee. It contains information that is legally privileged, confidential or otherwise protected from use or disclosure. If you are not the intended recipient, you are strictly prohibited from reviewing, disclosing, copying using or disseminating any of this information or taking any action in reliance on or regarding this information. If you have received this fax in error, please notify us immediately by telephone so that we can arrange for its return to [place]. Phone: [phone], Toll-Free: [phone], Fax: [phone] Page: 2 of 2 Call Id: [id number] Override Reason: Patient's symptoms need a higher level of care Caller Understands: Yes Disagree/Comply: Comply Care Advice Given Per Guideline SEE PCP WITHIN 3 DAYS: * You need to be seen within 2 or 3 days. Call your doctor during regular office hours and make an appointment. An urgent care center is often the best source of care if your doctor's office is closed or you can't get an appointment. NOTE: If office will be open tomorrow, tell caller to call then, not in 3 days. HIGH BLOOD PRESSURE: * Untreated high blood pressure may cause damage to your heart, brain, kidneys, and eyes. * The goal of blood pressure treatment for most people with hypertension is to keep the blood pressure under 140/90. For people that are 60 years or older, your doctor may instead want to keep the blood pressure under 150/90. * DECREASE SODIUM INTAKE: Aim to eat less than 2.4 g (100 mmol) of sodium each day. Unfortunately 75% of the salt in the average person's diet is in  pre-processed foods. * LIMIT ALCOHOL: Limit alcohol to 0-2 standard drinks each day. Men should have less than 14 dinks per week. Women  should have less than 9 drinks per week. A drink is 1.5 oz hard liquor (one shot or jigger; 45 ml), 5 oz wine (small glass; 150 ml), 12 oz beer (one can; 360 ml). * REDUCE STRESS: Find activities that help reduce your stress. Examples might include meditation, yoga, or even a restful walk in a park. CALL BACK IF: * Your blood pressure is over 180/110 * You become worse. * Chest pain or difficulty breathing occurs * Difficulty walking, difficulty talking, or severe headache occurs * Weakness or numbness of the face, arm or leg on one side of the body occurs HYPERTENSION MEDICATIONS: CALL BACK IF: * Weakness or numbness of the face, arm or leg on one side of the body occurs * Difficulty walking, difficulty talking, or severe headache occurs * Chest pain or difficulty breathing occurs * You become worse. Referrals REFERRED TO PCP OFFICE

## 2015-10-10 ENCOUNTER — Encounter: Payer: Self-pay | Admitting: Internal Medicine

## 2015-10-10 ENCOUNTER — Ambulatory Visit (INDEPENDENT_AMBULATORY_CARE_PROVIDER_SITE_OTHER): Payer: Commercial Managed Care - HMO | Admitting: Internal Medicine

## 2015-10-10 VITALS — BP 130/80 | HR 58 | Temp 98.0°F | Resp 20 | Ht 64.5 in | Wt 138.0 lb

## 2015-10-10 DIAGNOSIS — I10 Essential (primary) hypertension: Secondary | ICD-10-CM

## 2015-10-10 NOTE — Patient Instructions (Signed)
Limit your sodium (Salt) intake  Please check your blood pressure on a regular basis.  If it is consistently greater than 150/90, please make an office appointment.  Call or return to clinic prn if these symptoms worsen or fail to improve as anticipated.  

## 2015-10-10 NOTE — Progress Notes (Signed)
Subjective:    Patient ID: Julie Duffy, female    DOB: 08-03-1946, 70 y.o.   MRN: [id number]  HPI  70 year old patient who has essential hypertension.  She awoke earlier in the week from a sound sleep with a sensation of her face being flushed and burning.  She noted her blood pressure to be 147 over 96 and she was concerned about poor nocturnal blood pressure control.  She states blood pressure readings throughout the day are always normal.  She has been on amlodipine 5 mg daily. She states that she had a similar episode prior to initiating antihypertensive medications that prompted a urgent care visit and initiation of amlodipine. She is on Premarin cream twice weekly per urology due to atrophic vaginitis and recurrent UTIs  Past Medical History  Diagnosis Date  . Hypothyroid   . Allergy     seasonal  . Scoliosis   . Anxiety     Social History   Social History  . Marital Status: Divorced    Spouse Name: N/A  . Number of Children: 2  . Years of Education: N/A   Occupational History  . CMA    Social History Main Topics  . Smoking status: Never Smoker   . Smokeless tobacco: Never Used  . Alcohol Use: No  . Drug Use: No  . Sexual Activity: Not on file   Other Topics Concern  . Not on file   Social History Narrative    Past Surgical History  Procedure Laterality Date  . Nasal sinus surgery    . Neck lesion biopsy      cysts benign  . Umbilical hernia repair    . Appendectomy    . Tubal ligation      Family History  Problem Relation Age of Onset  . Cancer Mother     colon ca  . Heart disease Mother   . Colon cancer Mother   . Cancer Father   . Thyroid disease Daughter   . Thyroid disease Mother     Allergies  Allergen Reactions  . Guaifenesin & Derivatives Nausea And Vomiting  . Clindamycin/Lincomycin Cross Reactors Other (See Comments)    GI intolerance  . Codeine Other (See Comments)    hallucinations    Current Outpatient Prescriptions on  File Prior to Visit  Medication Sig Dispense Refill  . ALPRAZolam (XANAX) 0.5 MG tablet Take 1 tablet (0.5 mg total) by mouth 2 (two) times daily as needed for anxiety. 60 tablet 2  . amLODipine (NORVASC) 5 MG tablet Take 1 tablet (5 mg total) by mouth daily. 90 tablet 3  . levothyroxine (SYNTHROID, LEVOTHROID) 88 MCG tablet Take 88-176 mcg by mouth See admin instructions. Take 2 tablets on Sunday then take 1 tablet all the other days     No current facility-administered medications on file prior to visit.    BP 130/80 mmHg  Pulse 58  Temp(Src) 98 F (36.7 C) (Oral)  Resp 20  Ht 5' 4.5" (1.638 m)  Wt 138 lb (62.596 kg)  BMI 23.33 kg/m2  SpO2 98%     Review of Systems  Constitutional: Negative.   HENT: Negative for congestion, dental problem, hearing loss, rhinorrhea, sinus pressure, sore throat and tinnitus.   Eyes: Negative for pain, discharge and visual disturbance.  Respiratory: Negative for cough and shortness of breath.   Cardiovascular: Negative for chest pain, palpitations and leg swelling.  Gastrointestinal: Negative for nausea, vomiting, abdominal pain, diarrhea, constipation, blood in stool and abdominal  distention.  Genitourinary: Negative for dysuria, urgency, frequency, hematuria, flank pain, vaginal bleeding, vaginal discharge, difficulty urinating, vaginal pain and pelvic pain.  Musculoskeletal: Negative for joint swelling, arthralgias and gait problem.  Skin: Negative for rash.  Neurological: Negative for dizziness, syncope, speech difficulty, weakness, numbness and headaches.  Hematological: Negative for adenopathy.  Psychiatric/Behavioral: Negative for behavioral problems, dysphoric mood and agitation. The patient is not nervous/anxious.        Objective:   Physical Exam  Constitutional: She appears well-developed and well-nourished. No distress.  Blood pressure 120-1:30/80-84  Pulmonary/Chest: Effort normal and breath sounds normal.  Musculoskeletal: She  exhibits no edema.          Assessment & Plan:   Hypertension.  Will continue home blood pressure monitoring.  Patient is concerned about elevated nocturnal readings initiating her symptoms.  Will observe at this time;  will report any new or worsening symptoms

## 2015-10-10 NOTE — Progress Notes (Signed)
Pre visit review using our clinic review tool, if applicable. No additional management support is needed unless otherwise documented below in the visit note. 

## 2015-10-17 ENCOUNTER — Other Ambulatory Visit: Payer: Self-pay | Admitting: Obstetrics and Gynecology

## 2015-10-17 DIAGNOSIS — Z01419 Encounter for gynecological examination (general) (routine) without abnormal findings: Secondary | ICD-10-CM | POA: Diagnosis not present

## 2015-10-17 DIAGNOSIS — Z124 Encounter for screening for malignant neoplasm of cervix: Secondary | ICD-10-CM | POA: Diagnosis not present

## 2015-10-18 LAB — CYTOLOGY - PAP

## 2015-10-31 ENCOUNTER — Encounter: Payer: Commercial Managed Care - HMO | Admitting: Internal Medicine

## 2015-11-22 ENCOUNTER — Encounter: Payer: Self-pay | Admitting: Gastroenterology

## 2015-12-11 ENCOUNTER — Other Ambulatory Visit: Payer: Self-pay | Admitting: Internal Medicine

## 2015-12-19 ENCOUNTER — Encounter: Payer: Commercial Managed Care - HMO | Admitting: Internal Medicine

## 2016-01-09 ENCOUNTER — Telehealth: Payer: Self-pay | Admitting: Internal Medicine

## 2016-01-09 NOTE — Telephone Encounter (Signed)
Patient Name: Julie Duffy DOB: Oct 04, 1945 Initial Comment Caller states has been woken by elevated bp off and on recently Nurse Assessment Nurse: Yetta BarreJones, RN, Miranda Date/Time (Eastern Time): 01/09/2016 12:32:14 PM Confirm and document reason for call. If symptomatic, describe symptoms. You must click the next button to save text entered. ---Caller states during the night she felt flushed and ears ringing. She checked her BP and it was 155/110. She took 1/2 Xanex. Today it was 125/70. Has the patient traveled out of the country within the last 30 days? ---Not Applicable Does the patient have any new or worsening symptoms? ---Yes Will a triage be completed? ---Yes Related visit to physician within the last 2 weeks? ---No Does the PT have any chronic conditions? (i.e. diabetes, asthma, etc.) ---Yes List chronic conditions. ---HTN, Anxiety, Thyroid Is this a behavioral health or substance abuse call? ---No Guidelines Guideline Title Affirmed Question Affirmed Notes High Blood Pressure BP # 160/100 Final Disposition User See PCP When Office is Open (within 3 days) Yetta BarreJones, RN, Miranda Comments Appt scheduled for 6/9 at 10:15 with Dr. Amador CunasKwiatkowski Referrals REFERRED TO PCP OFFICE Disagree/Comply: Comply

## 2016-01-09 NOTE — Telephone Encounter (Signed)
Appointment scheduled for 6/9 @ 10:15.

## 2016-01-12 ENCOUNTER — Ambulatory Visit (INDEPENDENT_AMBULATORY_CARE_PROVIDER_SITE_OTHER): Payer: Commercial Managed Care - HMO | Admitting: Internal Medicine

## 2016-01-12 ENCOUNTER — Encounter: Payer: Self-pay | Admitting: Internal Medicine

## 2016-01-12 VITALS — BP 132/80 | HR 54 | Temp 97.8°F | Ht 64.5 in | Wt 137.8 lb

## 2016-01-12 DIAGNOSIS — I10 Essential (primary) hypertension: Secondary | ICD-10-CM

## 2016-01-12 DIAGNOSIS — E039 Hypothyroidism, unspecified: Secondary | ICD-10-CM | POA: Diagnosis not present

## 2016-01-12 NOTE — Progress Notes (Signed)
Pre visit review using our clinic review tool, if applicable. No additional management support is needed unless otherwise documented below in the visit note. 

## 2016-01-12 NOTE — Progress Notes (Signed)
Subjective:    Patient ID: Julie Duffy, female    DOB: 1946-03-19, 70 y.o.   MRN: [id number]  HPI   70 year old patient who has been given a diagnosis of essential hypertension.  She presented to the ED in February after awakening with a flushed feeling, associated with ringing in the ears in the ED, she had persistent elevated blood pressure readings and was initially placed on amlodipine 10 mg daily.  This has been down titrated to 5 mg daily early in the week.  She had another episode where she awoke with a similar flushed feeling with ringing in the ears blood pressure was 155 over 110 she took a Xanax and symptoms quickly resolved  Past Medical History  Diagnosis Date  . Hypothyroid   . Allergy     seasonal  . Scoliosis   . Anxiety      Social History   Social History  . Marital Status: Divorced    Spouse Name: N/A  . Number of Children: 2  . Years of Education: N/A   Occupational History  . CMA    Social History Main Topics  . Smoking status: Never Smoker   . Smokeless tobacco: Never Used  . Alcohol Use: No  . Drug Use: No  . Sexual Activity: Not on file   Other Topics Concern  . Not on file   Social History Narrative    Past Surgical History  Procedure Laterality Date  . Nasal sinus surgery    . Neck lesion biopsy      cysts benign  . Umbilical hernia repair    . Appendectomy    . Tubal ligation      Family History  Problem Relation Age of Onset  . Cancer Mother     colon ca  . Heart disease Mother   . Colon cancer Mother   . Cancer Father   . Thyroid disease Daughter   . Thyroid disease Mother     Allergies  Allergen Reactions  . Guaifenesin & Derivatives Nausea And Vomiting  . Clindamycin/Lincomycin Cross Reactors Other (See Comments)    GI intolerance  . Codeine Other (See Comments)    hallucinations    Current Outpatient Prescriptions on File Prior to Visit  Medication Sig Dispense Refill  . ALPRAZolam (XANAX) 0.5 MG tablet  Take 1 tablet (0.5 mg total) by mouth 2 (two) times daily as needed for anxiety. 60 tablet 2  . amLODipine (NORVASC) 5 MG tablet Take 1 tablet (5 mg total) by mouth daily. 90 tablet 3  . levothyroxine (SYNTHROID, LEVOTHROID) 88 MCG tablet Take 88-176 mcg by mouth See admin instructions. Take 2 tablets on Sunday then take 1 tablet all the other days     No current facility-administered medications on file prior to visit.    BP 132/80 mmHg  Pulse 54  Temp(Src) 97.8 F (36.6 C) (Oral)  Ht 5' 4.5" (1.638 m)  Wt 137 lb 12.8 oz (62.506 kg)  BMI 23.30 kg/m2  SpO2 97%     Review of Systems  Constitutional: Negative.   HENT: Positive for tinnitus. Negative for congestion, dental problem, hearing loss, rhinorrhea, sinus pressure and sore throat.   Eyes: Negative for pain, discharge and visual disturbance.  Respiratory: Negative for cough and shortness of breath.   Cardiovascular: Negative for chest pain, palpitations and leg swelling.  Gastrointestinal: Negative for nausea, vomiting, abdominal pain, diarrhea, constipation, blood in stool and abdominal distention.  Genitourinary: Negative for dysuria, urgency, frequency, hematuria,  flank pain, vaginal bleeding, vaginal discharge, difficulty urinating, vaginal pain and pelvic pain.  Musculoskeletal: Negative for joint swelling, arthralgias and gait problem.  Skin: Negative for rash.  Neurological: Negative for dizziness, syncope, speech difficulty, weakness, numbness and headaches.  Hematological: Negative for adenopathy.  Psychiatric/Behavioral: Negative for behavioral problems, dysphoric mood and agitation. The patient is not nervous/anxious.        Objective:   Physical Exam  Constitutional: She is oriented to person, place, and time. She appears well-developed and well-nourished.  Blood pressure 130/80  HENT:  Head: Normocephalic.  Right Ear: External ear normal.  Left Ear: External ear normal.  Mouth/Throat: Oropharynx is clear  and moist.  Eyes: Conjunctivae and EOM are normal. Pupils are equal, round, and reactive to light.  Neck: Normal range of motion. Neck supple. No thyromegaly present.  Cardiovascular: Normal rate, regular rhythm, normal heart sounds and intact distal pulses.   Pulmonary/Chest: Effort normal and breath sounds normal.  Abdominal: Soft. Bowel sounds are normal. She exhibits no mass. There is no tenderness.  Musculoskeletal: Normal range of motion.  Lymphadenopathy:    She has no cervical adenopathy.  Neurological: She is alert and oriented to person, place, and time.  Skin: Skin is warm and dry. No rash noted.  Psychiatric: She has a normal mood and affect. Her behavior is normal.          Assessment & Plan:   , rare episodes of facial flushing and tinnitus.  These are associated with elevated blood pressure readings.  Feel that the hypertension is secondary and not primary.  This was discussed with the patient, his episodes are quite infrequent and respond promptly to Xanax.  Will observe at this time  Rogelia Boga, MD

## 2016-01-12 NOTE — Patient Instructions (Signed)
Limit your sodium (Salt) intake  Please check your blood pressure on a regular basis.  If it is consistently greater than 150/90, please make an office appointment.  Call or return to clinic prn if these symptoms worsen or fail to improve as anticipated.  

## 2016-03-09 ENCOUNTER — Other Ambulatory Visit: Payer: Self-pay | Admitting: Internal Medicine

## 2016-03-26 ENCOUNTER — Other Ambulatory Visit: Payer: Self-pay | Admitting: Internal Medicine

## 2016-03-27 NOTE — Telephone Encounter (Signed)
Yes thanks, may provide # [phone]

## 2016-06-11 DIAGNOSIS — N952 Postmenopausal atrophic vaginitis: Secondary | ICD-10-CM | POA: Diagnosis not present

## 2016-07-05 ENCOUNTER — Emergency Department (HOSPITAL_COMMUNITY): Payer: Commercial Managed Care - HMO

## 2016-07-05 ENCOUNTER — Emergency Department (HOSPITAL_COMMUNITY)
Admission: EM | Admit: 2016-07-05 | Discharge: 2016-07-06 | Disposition: A | Discharge status: Home / Self Care | Payer: Commercial Managed Care - HMO | Attending: Emergency Medicine | Admitting: Emergency Medicine

## 2016-07-05 ENCOUNTER — Encounter (HOSPITAL_COMMUNITY): Payer: Self-pay | Admitting: Emergency Medicine

## 2016-07-05 ENCOUNTER — Telehealth: Payer: Self-pay | Admitting: Internal Medicine

## 2016-07-05 DIAGNOSIS — R319 Hematuria, unspecified: Secondary | ICD-10-CM

## 2016-07-05 DIAGNOSIS — M542 Cervicalgia: Secondary | ICD-10-CM

## 2016-07-05 DIAGNOSIS — E039 Hypothyroidism, unspecified: Secondary | ICD-10-CM | POA: Diagnosis not present

## 2016-07-05 DIAGNOSIS — N390 Urinary tract infection, site not specified: Secondary | ICD-10-CM | POA: Diagnosis not present

## 2016-07-05 DIAGNOSIS — I10 Essential (primary) hypertension: Secondary | ICD-10-CM | POA: Insufficient documentation

## 2016-07-05 DIAGNOSIS — M549 Dorsalgia, unspecified: Secondary | ICD-10-CM | POA: Diagnosis present

## 2016-07-05 DIAGNOSIS — R079 Chest pain, unspecified: Secondary | ICD-10-CM | POA: Diagnosis not present

## 2016-07-05 LAB — CBC
HCT: 39.5 % (ref 36.0–46.0)
Hemoglobin: 13.4 g/dL (ref 12.0–15.0)
MCH: 29.6 pg (ref 26.0–34.0)
MCHC: 33.9 g/dL (ref 30.0–36.0)
MCV: 87.2 fL (ref 78.0–100.0)
Platelets: 280 K/uL (ref 150–400)
RBC: 4.53 MIL/uL (ref 3.87–5.11)
RDW: 13 % (ref 11.5–15.5)
WBC: 9.1 K/uL (ref 4.0–10.5)

## 2016-07-05 LAB — URINALYSIS, ROUTINE W REFLEX MICROSCOPIC
Bilirubin Urine: NEGATIVE
Glucose, UA: NEGATIVE mg/dL
Ketones, ur: NEGATIVE mg/dL
Nitrite: POSITIVE — AB
Protein, ur: NEGATIVE mg/dL
Specific Gravity, Urine: 1.008 (ref 1.005–1.030)
pH: 5 (ref 5.0–8.0)

## 2016-07-05 LAB — BASIC METABOLIC PANEL
ANION GAP: 8 (ref 5–15)
BUN: 17 mg/dL (ref 6–20)
CHLORIDE: 102 mmol/L (ref 101–111)
CO2: 28 mmol/L (ref 22–32)
Calcium: 9.6 mg/dL (ref 8.9–10.3)
Creatinine, Ser: 0.97 mg/dL (ref 0.44–1.00)
GFR, EST NON AFRICAN AMERICAN: 57 mL/min — AB (ref >60)
Glucose, Bld: 98 mg/dL (ref 65–99)
POTASSIUM: 4.9 mmol/L (ref 3.5–5.1)
SODIUM: 138 mmol/L (ref 135–145)

## 2016-07-05 LAB — URINE MICROSCOPIC-ADD ON

## 2016-07-05 LAB — I-STAT TROPONIN, ED
TROPONIN I, POC: 0 ng/mL (ref 0.00–0.08)
TROPONIN I, POC: 0 ng/mL (ref 0.00–0.08)

## 2016-07-05 LAB — CBG MONITORING, ED: Glucose-Capillary: 91 mg/dL (ref 65–99)

## 2016-07-05 MED ORDER — KETOROLAC TROMETHAMINE 30 MG/ML IJ SOLN
15.0000 mg | Freq: Once | INTRAMUSCULAR | Status: AC
  Administered 2016-07-05: 15 mg via INTRAVENOUS
  Filled 2016-07-05: qty 1

## 2016-07-05 MED ORDER — CYCLOBENZAPRINE HCL 10 MG PO TABS
5.0000 mg | ORAL_TABLET | Freq: Once | ORAL | Status: AC
Start: 2016-07-05 — End: 2016-07-05
  Administered 2016-07-05: 5 mg via ORAL
  Filled 2016-07-05: qty 1

## 2016-07-05 NOTE — ED Triage Notes (Signed)
Patient has upper neck pain associated with burning and soreness in both arms.  She has lower back pain with numbness in right foot.  She also has nausea x 2 day.

## 2016-07-05 NOTE — ED Provider Notes (Signed)
[facility]-EMERGENCY DEPT Provider Note   CSN: [id number] Arrival date & time: 07/05/16  1539     History   Chief Complaint Chief Complaint  Patient presents with  . Back Pain  . Neck Pain    HPI Julie Duffy is a 70 y.o. female.  Julie Duffy is a 70 y.o. female with h/o hypothyroidism and HTN presents to ED with complaint of neck pain. Patient reports onset of sxs approximately one week ago with b/l neck soreness and associated tingling/burning sensation with myalgias down both arms. Prior to onset of symptoms patient states she was using  A leaf blower which is out of the norm for her physical activity. Patient has tried xanax, ibuprofen, and OTC topical patches with mild relief. Gentle message of her neck and shoulders also improves her pain. No trauma or falls. She also reports onset today at approximately 2pm numbness from her right knee to foot that has since resolved. She reports nausea for 2 days and increased urinary frequency, denies abdominal pain, vomiting, dysuria, hematuria. She also had a bout of sharp left sided chest pain earlier today that has since resolved. Denies shortness of breath or cough. No cardiac history, high cholesterol, or diabetes. Denies fever, trouble swallowing, visual disturbances, rash, dizziness, lightheadedness, facial droop, slurred speech, LOC, weakness, loss of bowel/bladder control, h/o IVDU, h/o cancer, or any other complaints at this time.       Past Medical History:  Diagnosis Date  . Allergy    seasonal  . Anxiety   . Hypothyroid   . Scoliosis     Patient Active Problem List   Diagnosis Date Noted  . Essential hypertension 09/28/2015  . Hypotension, unspecified 04/12/2014  . UTI (lower urinary tract infection) 09/13/2012  . Nausea and vomiting 09/13/2012  . Paresthesia and pain of extremity 09/02/2011  . RHINITIS 01/25/2010  . Backache 10/06/2009  . SINUSITIS, ACUTE 07/19/2009  . Headache 10/04/2008  . Acute upper  respiratory infection 09/13/2008  . VITILIGO 01/25/2008  . Hypothyroidism 07/16/2007    Past Surgical History:  Procedure Laterality Date  . APPENDECTOMY    . NASAL SINUS SURGERY    . NECK LESION BIOPSY     cysts benign  . TUBAL LIGATION    . UMBILICAL HERNIA REPAIR      OB History    No data available       Home Medications    Prior to Admission medications   Medication Sig Start Date End Date Taking? Authorizing Provider  ALPRAZolam (XANAX) 0.5 MG tablet TAKE 1 TABLET BY MOUTH TWICE A DAY Patient taking differently: TAKE 1 TABLET BY MOUTH TWICE A DAY AS NEEDED FOR ANXIETY 03/27/16  Yes Shelva Majestic, MD  amLODipine (NORVASC) 5 MG tablet Take 1 tablet (5 mg total) by mouth daily. 09/28/15  Yes Gordy Savers, MD  levothyroxine (SYNTHROID, LEVOTHROID) 88 MCG tablet TAKE 1 TABLET EVERY DAY Patient taking differently: TAKE 1-2 TABLETS BY MOUTH EVERY DAY 03/11/16  Yes Gordy Savers, MD  Menthol, Topical Analgesic, (PAIN RELIEVING PATCH ULTRA ST EX) Apply 1 patch topically daily.   Yes Historical Provider, MD  cephALEXin (KEFLEX) 500 MG capsule Take 1 capsule (500 mg total) by mouth 2 (two) times daily. 07/06/16 07/11/16  Lona Kettle, PA-C  cyclobenzaprine (FLEXERIL) 5 MG tablet Take 1 tablet (5 mg total) by mouth 3 (three) times daily as needed for muscle spasms. 07/06/16   Lona Kettle, PA-C  levothyroxine (SYNTHROID, LEVOTHROID) 88 MCG  tablet Take 88-176 mcg by mouth See admin instructions. Take 2 tablets on Sunday then take 1 tablet all the other days    Historical Provider, MD  naproxen (NAPROSYN) 500 MG tablet Take 1 tablet (500 mg total) by mouth 2 (two) times daily. 07/06/16   Lona Kettle, PA-C    Family History Family History  Problem Relation Age of Onset  . Cancer Mother     colon ca  . Heart disease Mother   . Colon cancer Mother   . Thyroid disease Mother   . Cancer Father   . Thyroid disease Daughter     Social History Social  History  Substance Use Topics  . Smoking status: Never Smoker  . Smokeless tobacco: Never Used  . Alcohol use No     Allergies   Guaifenesin & derivatives; Clindamycin/lincomycin cross reactors; and Codeine   Review of Systems Review of Systems  Constitutional: Positive for chills ( chronic which she attributes to her hypothyroidism). Negative for fever.  HENT: Negative for trouble swallowing.   Eyes: Negative for visual disturbance.  Respiratory: Negative for shortness of breath.   Cardiovascular: Positive for chest pain ( resolved).  Gastrointestinal: Positive for nausea. Negative for abdominal pain, constipation, diarrhea and vomiting.  Genitourinary: Positive for frequency. Negative for dysuria and hematuria.  Musculoskeletal: Positive for myalgias and neck pain. Negative for back pain.  Skin: Negative for rash.  Neurological: Positive for numbness. Negative for dizziness, syncope, facial asymmetry, speech difficulty, weakness, light-headedness and headaches.     Physical Exam Updated Vital Signs BP 147/76 (BP Location: Left Arm)   Pulse (!) 58   Temp 98.1 F (36.7 C) (Oral)   Resp 18   Ht 5' 4.5" (1.638 m)   Wt 59.9 kg   SpO2 98%   BMI 22.31 kg/m   Physical Exam  Constitutional: She appears well-developed and well-nourished. No distress.  HENT:  Head: Normocephalic and atraumatic.  Mouth/Throat: Oropharynx is clear and moist. No oropharyngeal exudate.  Eyes: Conjunctivae and EOM are normal. Pupils are equal, round, and reactive to light. Right eye exhibits no discharge. Left eye exhibits no discharge. No scleral icterus.  Neck: Normal range of motion and phonation normal. Neck supple. No spinous process tenderness present. No neck rigidity. Normal range of motion present.  No midline spinal tenderness. TTP of b/l trapezius musculature. Neck ROM intact.   Cardiovascular: Normal rate, regular rhythm, normal heart sounds and intact distal pulses.   No murmur  heard. Pulmonary/Chest: Effort normal and breath sounds normal. No stridor. No respiratory distress. She has no wheezes. She has no rales.  Abdominal: Soft. Bowel sounds are normal. She exhibits no distension. There is no tenderness. There is no rigidity, no rebound, no guarding and no CVA tenderness.  Abdomen is soft, non-distended, and non-tender.   Musculoskeletal: Normal range of motion.  No midline spinal tenderness. No crepitus. No step off. Patient moves all extremities with ease. Compartments are soft and non-tender.   Lymphadenopathy:    She has no cervical adenopathy.  Neurological: She is alert. She has normal strength. She is not disoriented. No cranial nerve deficit or sensory deficit. She exhibits normal muscle tone. She displays a negative Romberg sign. Coordination and gait normal. GCS eye subscore is 4. GCS verbal subscore is 5. GCS motor subscore is 6.  Mental Status:  Alert, thought content appropriate, able to give a coherent history. Speech fluent without evidence of aphasia. Able to follow 2 step commands without difficulty.  Cranial  Nerves:  II:  Peripheral visual fields grossly normal, pupils equal, round, reactive to light III,IV, VI: ptosis not present, extra-ocular motions intact bilaterally  V,VII: smile symmetric, facial light touch sensation equal VIII: hearing grossly normal to voice  X: uvula elevates symmetrically  XI: bilateral shoulder shrug symmetric and strong XII: midline tongue extension without fassiculations Motor:  Normal tone. 5/5 in upper and lower extremities bilaterally including strong and equal grip strength and dorsiflexion/plantar flexion Sensory: light touch normal in all extremities. Cerebellar: normal finger-to-nose with bilateral upper extremities Gait: normal gait and balance CV: distal pulses palpable throughout   Skin: Skin is warm and dry. No rash noted. She is not diaphoretic.  Psychiatric: She has a normal mood and affect. Her  behavior is normal.     ED Treatments / Results  Labs (all labs ordered are listed, but only abnormal results are displayed) Labs Reviewed  URINALYSIS, ROUTINE W REFLEX MICROSCOPIC (NOT AT [facility]) - Abnormal; Notable for the following:       Result Value   APPearance CLOUDY (*)    Hgb urine dipstick SMALL (*)    Nitrite POSITIVE (*)    Leukocytes, UA SMALL (*)    All other components within normal limits  BASIC METABOLIC PANEL - Abnormal; Notable for the following:    GFR calc non Af Amer 57 (*)    All other components within normal limits  URINE MICROSCOPIC-ADD ON - Abnormal; Notable for the following:    Squamous Epithelial / LPF 0-5 (*)    Bacteria, UA MANY (*)    All other components within normal limits  CBC  I-STAT TROPOININ, ED  CBG MONITORING, ED  I-STAT TROPOININ, ED    EKG  EKG Interpretation  Date/Time:  Friday July 05 2016 18:25:06 EST Ventricular Rate:  61 PR Interval:    QRS Duration: 86 QT Interval:  455 QTC Calculation: 459 R Axis:   22 Text Interpretation:  Normal sinus rhythm no acute ST/T changes no significant change since Feb 2017 Confirmed by Criss AlvineGOLDSTON MD, SCOTT [phone]([id number]) on 07/05/2016 7:13:17 PM       Radiology Dg Chest 2 View  Result Date: 07/05/2016 CLINICAL DATA:  Chest pain under the left breast EXAM: CHEST  2 VIEW COMPARISON:  09/12/2012 FINDINGS: The lungs are hyperinflated. No acute infiltrate, consolidation or effusion. Cardiomediastinal silhouette within normal limits. No pneumothorax. Stable scoliosis of the spine. IMPRESSION: Hyperinflation without acute infiltrate Electronically Signed   By: Jasmine PangKim  Fujinaga M.D.   On: 07/05/2016 19:29    Procedures Procedures (including critical care time)  Medications Ordered in ED Medications  cyclobenzaprine (FLEXERIL) tablet 5 mg (5 mg Oral Given 07/05/16 2205)  ketorolac (TORADOL) 30 MG/ML injection 15 mg (15 mg Intravenous Given 07/05/16 2205)     Initial Impression / Assessment and Plan /  ED Course  I have reviewed the triage vital signs and the nursing notes.  Pertinent labs & imaging results that were available during my care of the patient were reviewed by me and considered in my medical decision making (see chart for details).  Clinical Course as of Jul 06 1020  Fri Jul 05, 2016  2000 Hyperinflation. No obvious focal consolidation, effusion, or PTX.  DG Chest 2 View [AM]    Clinical Course User Index [AM] Lona KettleAshley Laurel Meyer, PA-C    Patient presents to ED with complaint of neck pain and b/l UE paresthesias x 1 week, numbness right knee to foot that has resolved, and CP that has resolved.  Patient is afebrile and non-toxic appearing in NAD. VSS. Physical exam is re-assuring. Heart RRR. Lungs CTABL. Abdomen soft, non-distended, non-tender. TTP of trapezius muscles b/l. Compartments are soft. No focal neuro deficits on exam. No loss of bowel or bladder control, fever, h/o IVDU, or h/o cancer. Low suspicion for meningitis. Patient is ambulatory with steady gait. Given episode of CP will check basic labs, EKG, and CXR. Pain medication and flexeril given in ED.    CBC and BMP nml. U/A remarkable for UTI, pt does endorse recent increased frequency in urination and has and has had nausea x 2 days, will treat for UTI - rx keflex. Delta troponin nml. EKG shows normal sinus rhythm. CXR shows hyperinflation otherwise unremarkable. Low suspicion for ACS. Unclear etiology for resolved CP. Will have follow up with PCP.   On re-evaluation patient endorses improvement in sxs. Repeat neuro exam stable and unchanged. ?MSK secondary to recent change in physical activity vs. ?pinched nerve. Discussed patient with Dr. Criss AlvineGoldston, who also evaluated pt, consulted neurology, likely peripheral etiology, recommend follow up outpatient. Discussed results and plan with patient. Rx naprosyn and flexeril. Continue heat/ice. Referral to neurology provided. Strict return precautions given. Pt voiced understanding  and is agreeable.   Final Clinical Impressions(s) / ED Diagnoses   Final diagnoses:  Urinary tract infection with hematuria, site unspecified  Neck pain    New Prescriptions Discharge Medication List as of 07/06/2016 12:52 AM    START taking these medications   Details  cephALEXin (KEFLEX) 500 MG capsule Take 1 capsule (500 mg total) by mouth 2 (two) times daily., Starting Sat 07/06/2016, Until Thu 07/11/2016, Print    cyclobenzaprine (FLEXERIL) 5 MG tablet Take 1 tablet (5 mg total) by mouth 3 (three) times daily as needed for muscle spasms., Starting Sat 07/06/2016, Print    naproxen (NAPROSYN) 500 MG tablet Take 1 tablet (500 mg total) by mouth 2 (two) times daily., Starting Sat 07/06/2016, Print         [place] NorthAshley Laurel Meyer, [place] JerseyPA-C 07/06/16 1022    Pricilla LovelessScott Goldston, MD 07/08/16 1027

## 2016-07-05 NOTE — Telephone Encounter (Signed)
[facility] - Client TELEPHONE ADVICE RECORD [facility] Patient Name: Julie Duffy DOB: 07/10/1946 Initial Comment Caller has tingling and pain in arm. Now it's inflamed and burning. Has also had numbness in one leg/ foot and tingling in stomach. Nurse Assessment Nurse: Debera Latalston, RN, Tinnie GensJeffrey Date/Time Lamount Cohen(Eastern Time): 07/05/2016 1:54:54 PM Confirm and document reason for call. If symptomatic, describe symptoms. ---Caller has tingling and pain in arm. Now it's inflamed and burning. Has also had numbness in one leg/ foot and tingling in stomach. Pain in arm started last week. Tingling in leg started today. Also having nausea. Does the patient have any new or worsening symptoms? ---Yes Will a triage be completed? ---Yes Related visit to physician within the last 2 weeks? ---No Does the PT have any chronic conditions? (i.e. diabetes, asthma, etc.) ---Yes List chronic conditions. ---HTN and hypothyroid Is this a behavioral health or substance abuse call? ---No Guidelines Guideline Title Affirmed Question Affirmed Notes Neurologic Deficit [1] Numbness (i.e., loss of sensation) of the face, arm / hand, or leg / foot on one side of the body AND [2] sudden onset AND [3] present now Final Disposition User Call EMS 911 Now Debera Latalston, RN, Tinnie GensJeffrey Disagree/Comply: Disagree Disagree/Comply Reason: Disagree with instructions

## 2016-07-05 NOTE — ED Notes (Addendum)
Pt attributes nausea with said back pain. Awaiting protocol labs until assessed by provider.

## 2016-07-05 NOTE — ED Notes (Signed)
Meyer PA at bedside and verbalizes will alert staff orders to complete post assessment.

## 2016-07-05 NOTE — Telephone Encounter (Signed)
Spoke with pt and she c/o increasing tingling and numbness in bilateral arms and abdomen/stomach. She notes that today she felt a numbness in her right leg from knee to foot which did resolve. Pt also c/o nausea x2 days. Advised pt that she needs to be seen in the ED immediately. Pt agrees. [facility]. Nothing further needed at this time.

## 2016-07-06 MED ORDER — NAPROXEN 500 MG PO TABS: 500.0000 mg | ORAL_TABLET | Freq: Two times a day (BID) | ORAL | 0 refills | Status: DC

## 2016-07-06 MED ORDER — CEPHALEXIN 500 MG PO CAPS: 500.0000 mg | ORAL_CAPSULE | Freq: Two times a day (BID) | ORAL | 0 refills | Status: AC

## 2016-07-06 MED ORDER — CYCLOBENZAPRINE HCL 5 MG PO TABS: 5.0000 mg | ORAL_TABLET | Freq: Three times a day (TID) | ORAL | 0 refills | Status: DC | PRN

## 2016-07-06 NOTE — Discharge Instructions (Signed)
Read the information below.  Your labs and EKG were re-assuring. Your chest x-ray showed some hyperinflation otherwise re-assuring.  Your urine is remarkable for a UTI. You are being prescribed antibiotics, please take as directed.  Regarding your neck pain and upper extremity numbness this may be a possible pinched nerve. You are being treated with naprosyn and flexeril. While taking naprosyn do not take other NSAIDs (ibuprofen, motrin, or aleve). Flexeril can make you drowsy, do not drive after taking. You can also try applying heat or ice to affected areas.  I have provided the contact information for neurology, please call to schedule a follow up appointment for further evaluation.  Use the prescribed medication as directed.  Please discuss all new medications with your pharmacist.   You may return to the Emergency Department at any time for worsening condition or any new symptoms that concern you. Return if you develop chest pain, shortness of breath, or any new/worsening neurologic symptoms.

## 2016-07-17 ENCOUNTER — Telehealth: Payer: Self-pay | Admitting: Internal Medicine

## 2016-07-17 NOTE — Telephone Encounter (Signed)
° ° ° °  Pt said she is having shoulder and arm pain was seen in the ER Friday 07/12/16 . She call today to request a referral to a chiropractor

## 2016-07-17 NOTE — Telephone Encounter (Signed)
Please schedule orthopedic referral 

## 2016-07-19 ENCOUNTER — Other Ambulatory Visit: Payer: Self-pay | Admitting: Internal Medicine

## 2016-07-19 DIAGNOSIS — M25519 Pain in unspecified shoulder: Secondary | ICD-10-CM

## 2016-07-19 DIAGNOSIS — M25529 Pain in unspecified elbow: Secondary | ICD-10-CM

## 2016-07-19 NOTE — Telephone Encounter (Signed)
Scheduled an orthopedic referral, spoke with pt to inform of referral and to keep a lookout for the phone call from ortho. Pt verbalized understanding.

## 2016-07-22 ENCOUNTER — Other Ambulatory Visit: Payer: Self-pay | Admitting: Family Medicine

## 2016-08-08 ENCOUNTER — Ambulatory Visit (INDEPENDENT_AMBULATORY_CARE_PROVIDER_SITE_OTHER): Payer: Commercial Managed Care - HMO

## 2016-08-08 ENCOUNTER — Ambulatory Visit (INDEPENDENT_AMBULATORY_CARE_PROVIDER_SITE_OTHER): Payer: Commercial Managed Care - HMO | Admitting: Orthopedic Surgery

## 2016-08-08 DIAGNOSIS — M542 Cervicalgia: Secondary | ICD-10-CM | POA: Diagnosis not present

## 2016-08-08 DIAGNOSIS — M5030 Other cervical disc degeneration, unspecified cervical region: Secondary | ICD-10-CM

## 2016-08-08 MED ORDER — PREDNISONE 10 MG PO TABS: 10.0000 mg | ORAL_TABLET | Freq: Every day | ORAL | 1 refills | Status: DC
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
## 2016-08-12 ENCOUNTER — Ambulatory Visit
Admission: RE | Admit: 2016-08-12 | Discharge: 2016-08-12 | Disposition: A | Discharge status: Home / Self Care | Payer: Commercial Managed Care - HMO | Source: Ambulatory Visit | Attending: Orthopedic Surgery | Admitting: Orthopedic Surgery

## 2016-08-12 DIAGNOSIS — M5030 Other cervical disc degeneration, unspecified cervical region: Secondary | ICD-10-CM

## 2016-08-12 DIAGNOSIS — M4802 Spinal stenosis, cervical region: Secondary | ICD-10-CM | POA: Diagnosis not present

## 2016-08-15 ENCOUNTER — Ambulatory Visit (INDEPENDENT_AMBULATORY_CARE_PROVIDER_SITE_OTHER): Payer: Self-pay | Admitting: Orthopedic Surgery

## 2016-08-16 ENCOUNTER — Encounter (INDEPENDENT_AMBULATORY_CARE_PROVIDER_SITE_OTHER): Payer: Self-pay | Admitting: Orthopedic Surgery

## 2016-08-16 ENCOUNTER — Ambulatory Visit (INDEPENDENT_AMBULATORY_CARE_PROVIDER_SITE_OTHER): Payer: Commercial Managed Care - HMO | Admitting: Orthopedic Surgery

## 2016-08-16 VITALS — Ht 64.5 in | Wt 132.0 lb

## 2016-08-16 DIAGNOSIS — M4722 Other spondylosis with radiculopathy, cervical region: Secondary | ICD-10-CM | POA: Diagnosis not present

## 2016-08-16 NOTE — Progress Notes (Signed)
Office Visit Note   Patient: Julie Duffy           Date of Birth: [birth year]           MRN: [id number] Visit Date: 08/16/2016              Requested by: Gordy SaversPeter F Kwiatkowski, MD [address] [place], [place] [address] PCP: Rogelia BogaKWIATKOWSKI,PETER FRANK, MD  Chief Complaint  Patient presents with  . Neck - Follow-up    MRI review    HPI: Patient presents for follow up for her neck and MRI review of cervical spine. She was taking low dose of prednisone which has give her minimal relief.   Patient has radicular symptoms in the left upper extremity no weakness  Assessment & Plan: Visit Diagnoses:  1. Osteoarthritis of spine with radiculopathy, cervical region     Plan: We'll plan for cervical spine injection with Dr. Alvester MorinNewton we will set this up at her convenience. Treatment options including anti-inflammatories injection versus surgical evaluation.   Patient is concerned that her mother had a "nerve block" 8 years ago and ended up developing gangrene in her feet and died.  Follow-Up Instructions: Return if symptoms worsen or fail to improve.   Ortho Exam Patient is alert oriented no adenopathy well-dressed normal affect normal breast where for. She has no focal motor weakness in either upper extremity. Left upper extremity radicular symptoms. Review of the MRI scan shows multilevel degenerative disc disease worse at C5-6 and C6-7 with foraminal narrowing worse on the left.  Imaging: No results found.  Orders:  No orders of the defined types were placed in this encounter.  No orders of the defined types were placed in this encounter.    Procedures: No procedures performed  Clinical Data: No additional findings.  Subjective: Review of Systems  Objective: Vital Signs: Ht 5' 4.5" (1.638 m)   Wt 132 lb (59.9 kg)   BMI 22.31 kg/m   Specialty Comments:  No specialty comments available.  PMFS History: Patient Active Problem List   Diagnosis Date Noted  .  Osteoarthritis of spine with radiculopathy, cervical region 08/16/2016  . Essential hypertension 09/28/2015  . Hypotension, unspecified 04/12/2014  . UTI (lower urinary tract infection) 09/13/2012  . Nausea and vomiting 09/13/2012  . Paresthesia and pain of extremity 09/02/2011  . RHINITIS 01/25/2010  . Backache 10/06/2009  . SINUSITIS, ACUTE 07/19/2009  . Headache 10/04/2008  . Acute upper respiratory infection 09/13/2008  . VITILIGO 01/25/2008  . Hypothyroidism 07/16/2007   Past Medical History:  Diagnosis Date  . Allergy    seasonal  . Anxiety   . Hypothyroid   . Scoliosis     Family History  Problem Relation Age of Onset  . Cancer Mother     colon ca  . Heart disease Mother   . Colon cancer Mother   . Thyroid disease Mother   . Cancer Father   . Thyroid disease Daughter     Past Surgical History:  Procedure Laterality Date  . APPENDECTOMY    . NASAL SINUS SURGERY    . NECK LESION BIOPSY     cysts benign  . TUBAL LIGATION    . 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 Smoking status: Never Smoker  . Smokeless tobacco: Never Used  . Alcohol use No  . Drug use: No  . Sexual  activity: Not on file

## 2016-08-21 ENCOUNTER — Institutional Professional Consult (permissible substitution) (INDEPENDENT_AMBULATORY_CARE_PROVIDER_SITE_OTHER): Payer: Commercial Managed Care - HMO | Admitting: Physical Medicine and Rehabilitation

## 2016-08-22 ENCOUNTER — Institutional Professional Consult (permissible substitution) (INDEPENDENT_AMBULATORY_CARE_PROVIDER_SITE_OTHER): Payer: Commercial Managed Care - HMO | Admitting: Physical Medicine and Rehabilitation

## 2016-08-23 ENCOUNTER — Ambulatory Visit (INDEPENDENT_AMBULATORY_CARE_PROVIDER_SITE_OTHER): Payer: Commercial Managed Care - HMO | Admitting: Orthopedic Surgery

## 2016-08-24 ENCOUNTER — Other Ambulatory Visit: Payer: Self-pay | Admitting: Internal Medicine

## 2016-08-28 ENCOUNTER — Ambulatory Visit (INDEPENDENT_AMBULATORY_CARE_PROVIDER_SITE_OTHER): Payer: Commercial Managed Care - HMO | Admitting: Physical Medicine and Rehabilitation

## 2016-08-28 ENCOUNTER — Encounter (INDEPENDENT_AMBULATORY_CARE_PROVIDER_SITE_OTHER): Payer: Self-pay | Admitting: Physical Medicine and Rehabilitation

## 2016-08-28 VITALS — BP 144/83 | HR 72

## 2016-08-28 DIAGNOSIS — M5412 Radiculopathy, cervical region: Secondary | ICD-10-CM | POA: Diagnosis not present

## 2016-08-28 DIAGNOSIS — M4802 Spinal stenosis, cervical region: Secondary | ICD-10-CM | POA: Diagnosis not present

## 2016-08-28 DIAGNOSIS — M542 Cervicalgia: Secondary | ICD-10-CM

## 2016-08-28 NOTE — Progress Notes (Signed)
Julie Duffy - 71 y.o. female MRN [id number]  Date of birth: 11/06/1945  Office Visit Note: Visit Date: 08/28/2016 PCP: Rogelia BogaKWIATKOWSKI,PETER FRANK, MD Referred by: Gordy SaversKwiatkowski, Peter F, MD  Subjective: Chief Complaint  Patient presents with  . Right Shoulder - Pain  . Left Shoulder - Pain  . Neck - Pain   HPI: Julie Duffy is a 71 year old female who is typically followed by Dr. Lajoyce Cornersuda who is complaining of neck and bilateral shoulder pain for around 3 months. Left worse than right. Started after she had been doing a lot of leaf blowing. Started with burning pain down arm to just below elbow but this is not happening a lot now. States pain is better now than it was. Notices it more at night now when she relaxes. Using heating pad which helps. Now more annoying pain and stiffness.she reports decreased range of motion with left rotation more than right. She carries a lot of tension in her shoulders she says. She is a somewhat anxious person and carries a lot of tension. Her primary care physician had given her some Xanax that she did use as a muscle relaxer that seemed to help her some. She has not had specific physical therapy or dry needling. She has had chiropractic care in the past that on other episodes seem to help. This is been a chronic situation really over time with intermittent flareups. She states that the pain is still problematic issue stenosis a lot of stiffness and tightness but just won't leave her alone. Other medications at this pointare not helping very much. She does use Naprosyn. She also has a history of lumbar scoliosis.MRI of the cervical spine was obtained and is reviewed below. She does have significant stenosis and we talked at length about this.she's not noted any symptoms into the hands or paresthesias in the hands or focal weakness. Her left-sided symptoms more of a C6 distribution but did not reach the thumb. Interestingly her mother evidently died per her report after  having [facility] 10 years [facility]. Unfortunately without knowing all the details around this is hard to try to explain the nuances of injections and epidurals and why this may have been a problem. She states she was told the steroid medicine caused her heart failure and gave her a lot of symptoms that actually ultimately resulted in her death.    Review of Systems  Constitutional: Negative for chills, fever, malaise/fatigue and weight loss.  HENT: Negative for hearing loss and sinus pain.   Eyes: Negative for blurred vision, double vision and photophobia.  Respiratory: Negative for cough and shortness of breath.   Cardiovascular: Negative for chest pain, palpitations and leg swelling.  Gastrointestinal: Negative for abdominal pain, nausea and vomiting.  Genitourinary: Negative for flank pain.  Musculoskeletal: Positive for neck pain. Negative for myalgias.  Skin: Negative for itching and rash.  Neurological: Positive for tingling. Negative for tremors, focal weakness and weakness.  Endo/Heme/Allergies: Negative.   Psychiatric/Behavioral: Negative for depression.  All other systems reviewed and are negative.  Otherwise per HPI.  Assessment & Plan: Visit Diagnoses:  1. Cervical radiculopathy   2. Spinal stenosis of cervical region   3. Cervicalgia     Plan: Findings:  Chronic history of neck and upper back pain and shoulder pain with intermittent exacerbations of severe pain at times. She does have MRI now showing pretty significant central canal multifactorial stenosis at C5-6 and C6-7. His old more  left-sided at C5-6 and this does fit with her symptoms of burning pain down the arm in a more C6 distribution. The radicular symptoms are somewhat better but she is getting a lot of pain in the neck and around the base of the neck and out to the shoulders. She has fairly large trigger points and really the rhomboids levator scapular  trapezius on exam I think that is causing a lot of her pain. Unfortunately this is a chicken or 8 type of event where the nerve root irritation could be giving her the myofascial pain. She says she is always carry tension in her shoulders however. She does have some level of anxiety. I think the best approach right now given her family history of her mother dying from a cervical injection and her symptoms having regressed a little bit at least from a radicular standpoint is to go ahead with physical therapy [facility] for manual treatment and dry needling and see how much relief she gets. I'll see her back in a  He would talk about a cervical epidural injection. I think she would do fine with that I have looked at herimages and then seeing herdone fairly well without arm but we did go  Alternatively she could see Referral to a spine surgeon for evaluation. Ultimately she  Neurologic signs on exam. I spent more than 25 minutes speaking face-to-face with the patient with 50% of the time in counseling.    Meds & Orders: No orders of the defined types were placed in this encounter.   Orders Placed This Encounter  Procedures  . Ambulatory referral to Physical Therapy    Follow-up: Return in about 4 weeks (around 09/25/2016) for Recheck spine.   Procedures: No procedures performed  No notes on file   Clinical History: Cervical spine MRI 08/12/2016 IMPRESSION: 1. Multilevel spondylosis of the cervical spine is most significant at C5-6 and C6-7 with severe foraminal and moderate central canal stenosis at both levels. Foraminal narrowing is worse on the left. 2. Moderate left foraminal stenosis at C4-5 with mild left central canal narrowing. 3. Facet hypertrophy at C2-3 and C3-4 without significant stenosis at either level.  She reports that she has never smoked. She has never used smokeless tobacco. No results for input(s): HGBA1C, LABURIC in the last 8760 hours.  Objective:  VS:   HT:    WT:   BMI:     BP:(!) 144/83  HR:72bpm  TEMP: ( )  RESP:  Physical Exam  Constitutional: She is oriented to person, place, and time. She appears well-developed and well-nourished. No distress.  HENT:  Head: Normocephalic and atraumatic.  Nose: Nose normal.  Mouth/Throat: Oropharynx is clear and moist.  Eyes: Conjunctivae and EOM are normal. Pupils are equal, round, and reactive to light.  Neck: Normal range of motion. Neck supple.  Cardiovascular: Normal rate, regular rhythm and intact distal pulses.   Pulmonary/Chest: Effort normal and breath sounds normal.  Abdominal: She exhibits no distension.  Musculoskeletal:  Cervical range of motion is limited with left rotation more than right. She has large trigger points to do reproduce some of her pain in the levator scapula rhomboids and trapezius. She has no shoulder impingement signs. She has good upper extremity strength in allmuscle groups particularly with wrist extenion long finger d brachioradiegative Spubilaterally.  Neurological: She is alert and oriented to person, place, and time. She displays normal reflexes. She exhibits normal muscle tone. Coordination normal.  Skin: Skin is warm and dry.  No rash noted. No erythema.  Psychiatric: She has a normal mood and affect. Her behavior is normal.  Nursing note and vitals reviewed.   Ortho Exam Imaging: No results found.  Past Medical/Family/Surgical/Social History: Medications & Allergies reviewed per EMR Patient Active Problem List   Diagnosis Date Noted  . Osteoarthritis of spine with radiculopathy, cervical region 08/16/2016  . Essential hypertension 09/28/2015  . Hypotension, unspecified 04/12/2014  . UTI (lower urinary tract infection) 09/13/2012  . Nausea and vomiting 09/13/2012  . Paresthesia and pain of extremity 09/02/2011  . RHINITIS 01/25/2010  . Backache 10/06/2009  . SINUSITIS, ACUTE 07/19/2009  . Headache 10/04/2008  . Acute upper respiratory infection  09/13/2008  . VITILIGO 01/25/2008  . Hypothyroidism 07/16/2007   Past Medical History:  Diagnosis Date  . Allergy    seasonal  . Anxiety   . Hypothyroid   . Scoliosis    Family History  Problem Relation Age of Onset  . Cancer Mother     colon ca  . Heart disease Mother   . Colon cancer Mother   . Thyroid disease Mother   . Cancer Father   . Thyroid disease Daughter    Past Surgical History:  Procedure Laterality Date  . APPENDECTOMY    . NASAL SINUS SURGERY    . NECK LESION BIOPSY     cysts benign  . TUBAL LIGATION    . 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 Smoking status: Never Smoker  . Smokeless tobacco: Never Used  . Alcohol use No  . Drug use: No  . Sexual activity: Not on file

## 2016-09-02 DIAGNOSIS — M542 Cervicalgia: Secondary | ICD-10-CM | POA: Diagnosis not present

## 2016-09-09 DIAGNOSIS — M542 Cervicalgia: Secondary | ICD-10-CM | POA: Diagnosis not present

## 2016-09-16 DIAGNOSIS — M542 Cervicalgia: Secondary | ICD-10-CM | POA: Diagnosis not present

## 2016-09-23 DIAGNOSIS — M542 Cervicalgia: Secondary | ICD-10-CM | POA: Diagnosis not present

## 2016-09-25 ENCOUNTER — Ambulatory Visit (INDEPENDENT_AMBULATORY_CARE_PROVIDER_SITE_OTHER): Payer: Commercial Managed Care - HMO | Admitting: Physical Medicine and Rehabilitation

## 2016-11-01 ENCOUNTER — Other Ambulatory Visit: Payer: Self-pay | Admitting: Internal Medicine

## 2016-11-04 ENCOUNTER — Encounter: Payer: Self-pay | Admitting: Family Medicine

## 2016-11-04 ENCOUNTER — Ambulatory Visit (INDEPENDENT_AMBULATORY_CARE_PROVIDER_SITE_OTHER): Payer: Commercial Managed Care - HMO | Admitting: Family Medicine

## 2016-11-04 VITALS — BP 102/70 | HR 58 | Temp 98.1°F | Wt 134.4 lb

## 2016-11-04 DIAGNOSIS — J069 Acute upper respiratory infection, unspecified: Secondary | ICD-10-CM

## 2016-11-04 DIAGNOSIS — R0981 Nasal congestion: Secondary | ICD-10-CM

## 2016-11-04 MED ORDER — PREDNISONE 10 MG PO TABS: ORAL_TABLET | ORAL | 0 refills | Status: DC

## 2016-11-04 NOTE — Progress Notes (Signed)
Patient ID: [name], female   DOB: 1945-11-21, 71 y.o.   MRN: [id number]  PCP: Rogelia Boga, MD  Subjective:  Julie Duffy is a 71 y.o. year old very pleasant female patient who presents with Upper Respiratory infection symptoms including sinus pressure/pain, post nasal drip -started: one day ago, symptoms  -previous treatments: Severe Tylenol cold medication that provided moderate benefit. -sick contacts/travel/risks: denies flu exposure. She denies recent sick contact exposure.  She is UTD with her influenza vaccine. No recent antibiotic use. -Hx: HTN:   No history of asthma/bronchitis.   ROS-denies fever, SOB, NVD, tooth pain  Pertinent Past Medical History- HTN  Medications- reviewed  Current Outpatient Prescriptions  Medication Sig Dispense Refill  . ALPRAZolam (XANAX) 0.5 MG tablet TAKE 1 TABLET TWICE DAILY 30 tablet 2  . amLODipine (NORVASC) 5 MG tablet TAKE 1 TABLET BY MOUTH DAILY 90 tablet 3  . cyclobenzaprine (FLEXERIL) 5 MG tablet Take 1 tablet (5 mg total) by mouth 3 (three) times daily as needed for muscle spasms. 15 tablet 0  . levothyroxine (SYNTHROID, LEVOTHROID) 88 MCG tablet Take 88-176 mcg by mouth See admin instructions. Take 2 tablets on Sunday then take 1 tablet all the other days    . levothyroxine (SYNTHROID, LEVOTHROID) 88 MCG tablet TAKE 1 TABLET EVERY DAY (Patient taking differently: TAKE 1-2 TABLETS BY MOUTH EVERY DAY) 90 tablet 0  . levothyroxine (SYNTHROID, LEVOTHROID) 88 MCG tablet TAKE 1 TABLET BY MOUTH EVERY DAY 90 tablet 1  . Menthol, Topical Analgesic, (PAIN RELIEVING PATCH ULTRA ST EX) Apply 1 patch topically daily.    . naproxen (NAPROSYN) 500 MG tablet Take 1 tablet (500 mg total) by mouth 2 (two) times daily. 20 tablet 0  . predniSONE (DELTASONE) 10 MG tablet Take 1 tablet (10 mg total) by mouth daily with breakfast. 30 tablet 1   No current facility-administered medications for this visit.     Objective: BP 102/70 (BP  Location: Left Arm, Patient Position: Sitting, Cuff Size: Normal)   Pulse (!) 58   Temp 98.1 F (36.7 C) (Oral)   Wt 134 lb 6.4 oz (61 kg)   SpO2 97%   BMI 22.71 kg/m  Gen: NAD, resting comfortably HEENT: Turbinates erythematous, TM normal, pharynx mildly erythematous with no tonsilar exudate or edema, + maxilary sinus tenderness CV: RRR no murmurs rubs or gallops Lungs: CTAB no crackles, wheeze, rhonchi Abdomen: soft/nontender/nondistended/normal bowel sounds. No rebound or guarding.  Ext: no edema Skin: warm, dry, no rash Neuro: grossly normal, moves all extremities  Assessment/Plan:  Upper Respiratory infection History and exam today are suggestive of viral infection most likely due to upper respiratory infection. Symptomatic treatment with: flonase, prednisone provided for sinus congestion/pressure which patient has a history of this type of sinus pressure, and use of Claritin  We discussed that we did not find any infection that had higher probability of being bacterial such as pneumonia or strep throat. We discussed signs that bacterial infection may have developed particularly fever or shortness of breath. Likely course of 1-2 weeks.    Finally, we reviewed reasons to return to care including if symptoms worsen or persist or new concerns arise- once again particularly shortness of breath or fever.  Julie Catalina, FNP

## 2016-11-04 NOTE — Patient Instructions (Addendum)
Please take medication as directed with food.  Your symptoms today are most likely caused by viral illness. Please drink plenty of water enough for your urine to be pale yellow or clear. You may use Tylenol 325 mg every 6 hours as needed and prednisone has been ordered for sinus pressure/congestion. You can also use saline nasal rinses for improvement with flonase. Follow-up for evaluation if your symptoms do not improve in 3-4 days, worsen, or you develop a fever greater than 100.    WE NOW OFFER   Julie [facility] FAST TRACK!!!  SAME DAY Appointments for ACUTE CARE  Such as: Sprains, Injuries, cuts, abrasions, rashes, muscle pain, joint pain, back pain Colds, flu, sore throats, headache, allergies, cough, fever  Ear pain, sinus and eye infections Abdominal pain, nausea, vomiting, diarrhea, upset stomach Animal/insect bites  3 Easy Ways to Schedule: Walk-In Scheduling Call in scheduling Mychart Sign-up: [email or web address]

## 2016-11-04 NOTE — Progress Notes (Signed)
Pre visit review using our clinic review tool, if applicable. No additional management support is needed unless otherwise documented below in the visit note. 

## 2016-12-13 ENCOUNTER — Encounter: Payer: Self-pay | Admitting: Gastroenterology

## 2016-12-18 ENCOUNTER — Ambulatory Visit (INDEPENDENT_AMBULATORY_CARE_PROVIDER_SITE_OTHER): Payer: Medicare HMO | Admitting: Family Medicine

## 2016-12-18 ENCOUNTER — Encounter: Payer: Self-pay | Admitting: Family Medicine

## 2016-12-18 VITALS — BP 130/70 | HR 56 | Temp 98.4°F | Wt 132.8 lb

## 2016-12-18 DIAGNOSIS — J069 Acute upper respiratory infection, unspecified: Secondary | ICD-10-CM | POA: Diagnosis not present

## 2016-12-18 DIAGNOSIS — B9789 Other viral agents as the cause of diseases classified elsewhere: Secondary | ICD-10-CM | POA: Diagnosis not present

## 2016-12-18 MED ORDER — BENZONATATE 200 MG PO CAPS: 200.0000 mg | ORAL_CAPSULE | Freq: Three times a day (TID) | ORAL | 0 refills | Status: DC | PRN

## 2016-12-18 NOTE — Progress Notes (Signed)
Subjective:     Patient ID: Julie Duffy, female   DOB: [birth year], 71 y.o.   MRN: [id number]  HPI Patient seen with one-week history of sore throat and dry cough. No body aches. She has some chills on Monday but no documented fever. No chills today. General fatigue. She's tried throat lozenges and home remedies for cough without much improvement. No dyspnea. No wheezing. Nonsmoker. Intolerance with codeine in past.   Past Medical History:  Diagnosis Date  . Allergy    seasonal  . Anxiety   . Hypothyroid   . Scoliosis    Past Surgical History:  Procedure Laterality Date  . APPENDECTOMY    . NASAL SINUS SURGERY    . NECK LESION BIOPSY     cysts benign  . TUBAL LIGATION    . UMBILICAL HERNIA REPAIR      reports that she has never smoked. She has never used smokeless tobacco. She reports that she does not drink alcohol or use drugs. family history includes Cancer in her father and mother; Colon cancer in her mother; Heart disease in her mother; Thyroid disease in her daughter and mother. Allergies  Allergen Reactions  . Guaifenesin & Derivatives Nausea And Vomiting  . Clindamycin/Lincomycin Cross Reactors Other (See Comments)    GI intolerance  . Codeine Other (See Comments)    hallucinations     Review of Systems  Constitutional: Positive for fatigue.  HENT: Positive for sore throat.   Respiratory: Positive for cough.        Objective:   Physical Exam  Constitutional: She appears well-developed and well-nourished.  HENT:  Right Ear: External ear normal.  Left Ear: External ear normal.  Mouth/Throat: Oropharynx is clear and moist.  Neck: Neck supple.  Cardiovascular: Normal rate and regular rhythm.   Pulmonary/Chest: Effort normal and breath sounds normal. No respiratory distress. She has no wheezes. She has no rales.  Lymphadenopathy:    She has no cervical adenopathy.       Assessment:     Probable viral URI with cough. Nonfocal exam    Plan:      -Tessalon Perles 200 mg every 8 hours as needed for cough -Follow-up promptly for any fever or increasing shortness of breath  Kristian CoveyBruce W Elbie Statzer MD [facility]

## 2016-12-18 NOTE — Patient Instructions (Signed)

## 2017-01-13 ENCOUNTER — Ambulatory Visit (INDEPENDENT_AMBULATORY_CARE_PROVIDER_SITE_OTHER): Payer: Medicare HMO | Admitting: Internal Medicine

## 2017-01-13 ENCOUNTER — Encounter: Payer: Self-pay | Admitting: Internal Medicine

## 2017-01-13 VITALS — BP 104/60 | HR 55 | Temp 98.2°F | Ht 64.5 in | Wt 133.2 lb

## 2017-01-13 DIAGNOSIS — E038 Other specified hypothyroidism: Secondary | ICD-10-CM

## 2017-01-13 DIAGNOSIS — Z0000 Encounter for general adult medical examination without abnormal findings: Secondary | ICD-10-CM | POA: Diagnosis not present

## 2017-01-13 DIAGNOSIS — I10 Essential (primary) hypertension: Secondary | ICD-10-CM

## 2017-01-13 NOTE — Patient Instructions (Addendum)
WE NOW OFFER   [facility] FAST TRACK!!!  SAME DAY Appointments for ACUTE CARE  Such as: Sprains, Injuries, cuts, abrasions, rashes, muscle pain, joint pain, back pain Colds, flu, sore throats, headache, allergies, cough, fever  Ear pain, sinus and eye infections Abdominal pain, nausea, vomiting, diarrhea, upset stomach Animal/insect bites  3 Easy Ways to Schedule: Walk-In Scheduling Call in scheduling Mychart Sign-up: [email or web address]   Limit your sodium (Salt) intake    It is important that you exercise regularly, at least 20 minutes 3 to 4 times per week.  If you develop chest pain or shortness of breath seek  medical attention.  Please check your blood pressure on a regular basis.  If it is consistently greater than 150/90, please make an office appointment.  Return in one year for follow-up   Schedule your colonoscopy to help detect colon cancer.

## 2017-01-13 NOTE — Progress Notes (Signed)
Subjective:    Patient ID: Julie Duffy, female    DOB: April 26, 1946, 71 y.o.   MRN: [id number]  HPI  71 year old patient who is seen today for an annual exam and  Medicare wellness visit. She has essential hypertension and hypothyroidism.  She is doing well. She has a family history of colon cancer and is scheduled for follow-up colonoscopy next month. No new concerns or complaints  Past Medical History:  Diagnosis Date  . Allergy    seasonal  . Anxiety   . Hypothyroid   . Scoliosis      Social History   Social History  . Marital status: Divorced    Spouse name: N/A  . Number of children: 2  . 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 Smoking status: Never Smoker  . Smokeless tobacco: Never Used  . Alcohol use No  . Drug use: No  . Sexual activity: Not on file   Other Topics Concern  . Not on file   Social History Narrative  . No narrative on file    Past Surgical History:  Procedure Laterality Date  . APPENDECTOMY    . NASAL SINUS SURGERY    . NECK LESION BIOPSY     cysts benign  . TUBAL LIGATION    . UMBILICAL HERNIA REPAIR      Family History  Problem Relation Age of Onset  . Cancer Mother        colon ca  . Heart disease Mother   . Colon cancer Mother   . Thyroid disease Mother   . Cancer Father   . Thyroid disease Daughter     Allergies  Allergen Reactions  . Guaifenesin & Derivatives Nausea And Vomiting  . Clindamycin/Lincomycin Cross Reactors Other (See Comments)    GI intolerance  . Codeine Other (See Comments)    hallucinations    Current Outpatient Prescriptions on File Prior to Visit  Medication Sig Dispense Refill  . ALPRAZolam (XANAX) 0.5 MG tablet TAKE 1 TABLET TWICE DAILY 30 tablet 2  . amLODipine (NORVASC) 5 MG tablet TAKE 1 TABLET BY MOUTH DAILY 90 tablet 3  . levothyroxine (SYNTHROID, LEVOTHROID) 88 MCG tablet Take 88-176 mcg by mouth See admin  instructions. Take 2 tablets on Sunday then take 1 tablet all the other days     No current facility-administered medications on file prior to visit.     BP 104/60 (BP Location: Left Arm, Patient Position: Sitting, Cuff Size: Normal)   Pulse (!) 55   Temp 98.2 F (36.8 C) (Oral)   Ht 5' 4.5" (1.638 m)   Wt 133 lb 3.2 oz (60.4 kg)   SpO2 96%   BMI 22.51 kg/m   Medicare wellness visit  1. Risk factors, based on past  M,S,F history.  Cardio vascular risk factors include a history of hypertension  2.  Physical activities:remains active.  Walks at least 3 times per week.  3.  Depression/mood:no history of major depression or mood disorder  4.  Hearing:no deficits  5.  ADL's:independent  6.  Fall risk:low  7.  Home safety:no problems identified  8.  Height weight, and visual acuity;height and weight stable no change in visual acuity  9.  Counseling:continue heart healthy diet regular exercise.  Colonoscopy as scheduled  10. Lab orders based on risk factors:we'll check laboratory update including TSH  11. Referral :GI referral as scheduled.  Annual mammograms.  Encouraged  12. Care plan:continue efforts at aggressive risk factor modification  13. Cognitive assessment: alert and oil with normal affect.  No cognitive dysfunction  14. Screening: Patient provided with a written and personalized 5-10 year screening schedule in the AVS.    15. Provider List Update: [facility]    Review of Systems  Constitutional: Negative.   HENT: Negative for congestion, dental problem, hearing loss, rhinorrhea, sinus pressure, sore throat and tinnitus.   Eyes: Negative for pain, discharge and visual disturbance.  Respiratory: Negative for cough and shortness of breath.   Cardiovascular: Negative for chest pain, palpitations and leg swelling.  Gastrointestinal: Negative for abdominal distention, abdominal pain, blood in stool, constipation, diarrhea, nausea and vomiting.    Genitourinary: Negative for difficulty urinating, dysuria, flank pain, frequency, hematuria, pelvic pain, urgency, vaginal bleeding, vaginal discharge and vaginal pain.  Musculoskeletal: Negative for arthralgias, gait problem and joint swelling.  Skin: Negative for rash.  Neurological: Negative for dizziness, syncope, speech difficulty, weakness, numbness and headaches.  Hematological: Negative for adenopathy.  Psychiatric/Behavioral: Negative for agitation, behavioral problems and dysphoric mood. The patient is not nervous/anxious.        Objective:   Physical Exam  Constitutional: She is oriented to person, place, and time. She appears well-developed and well-nourished.  HENT:  Head: Normocephalic.  Right Ear: External ear normal.  Left Ear: External ear normal.  Mouth/Throat: Oropharynx is clear and moist.  Eyes: Conjunctivae and EOM are normal. Pupils are equal, round, and reactive to light.  Neck: Normal range of motion. Neck supple. No thyromegaly present.  Cardiovascular: Normal rate, regular rhythm, normal heart sounds and intact distal pulses.   Pulmonary/Chest: Effort normal and breath sounds normal.  Abdominal: Soft. Bowel sounds are normal. She exhibits no mass. There is no tenderness.  Musculoskeletal: Normal range of motion.  Lymphadenopathy:    She has no cervical adenopathy.  Neurological: She is alert and oriented to person, place, and time.  Skin: Skin is warm and dry. No rash noted.  Psychiatric: She has a normal mood and affect. Her behavior is normal.          Assessment & Plan:    Essential hypertension, well-controlled Hypothyroidism.  We'll review a TSH Medicare wellness visit.  Will review updated lab Follow-up 6-12 months  Dennies Coate Homero FellersFRANK

## 2017-01-14 LAB — COMPREHENSIVE METABOLIC PANEL
ALBUMIN: 4 g/dL (ref 3.5–5.2)
ALK PHOS: 70 U/L (ref 39–117)
ALT: 9 U/L (ref 0–35)
AST: 15 U/L (ref 0–37)
BILIRUBIN TOTAL: 0.6 mg/dL (ref 0.2–1.2)
BUN: 13 mg/dL (ref 6–23)
CO2: 31 mEq/L (ref 19–32)
CREATININE: 1.06 mg/dL (ref 0.40–1.20)
Calcium: 9.5 mg/dL (ref 8.4–10.5)
Chloride: 105 mEq/L (ref 96–112)
GFR: 54.18 mL/min — ABNORMAL LOW (ref >60.00)
GLUCOSE: 80 mg/dL (ref 70–99)
Potassium: 4.1 mEq/L (ref 3.5–5.1)
SODIUM: 140 meq/L (ref 135–145)
TOTAL PROTEIN: 6.3 g/dL (ref 6.0–8.3)

## 2017-01-14 LAB — CBC WITH DIFFERENTIAL/PLATELET
Basophils Absolute: 0 10*3/uL (ref 0.0–0.1)
Basophils Relative: 0.6 % (ref 0.0–3.0)
EOS ABS: 0.4 10*3/uL (ref 0.0–0.7)
Eosinophils Relative: 5.7 % — ABNORMAL HIGH (ref 0.0–5.0)
HCT: 35.5 % — ABNORMAL LOW (ref 36.0–46.0)
HEMOGLOBIN: 11.9 g/dL — AB (ref 12.0–15.0)
LYMPHS ABS: 1.8 10*3/uL (ref 0.7–4.0)
Lymphocytes Relative: 24.6 % (ref 12.0–46.0)
MCHC: 33.6 g/dL (ref 30.0–36.0)
MCV: 88.7 fl (ref 78.0–100.0)
MONO ABS: 0.6 10*3/uL (ref 0.1–1.0)
Monocytes Relative: 8 % (ref 3.0–12.0)
NEUTROS PCT: 61.1 % (ref 43.0–77.0)
Neutro Abs: 4.4 10*3/uL (ref 1.4–7.7)
Platelets: 279 10*3/uL (ref 150.0–400.0)
RBC: 4 Mil/uL (ref 3.87–5.11)
RDW: 13.7 % (ref 11.5–15.5)
WBC: 7.2 10*3/uL (ref 4.0–10.5)

## 2017-01-14 LAB — TSH: TSH: 0.12 u[IU]/mL — AB (ref 0.35–4.50)

## 2017-02-17 ENCOUNTER — Encounter: Payer: Commercial Managed Care - HMO | Admitting: Gastroenterology

## 2017-04-08 ENCOUNTER — Encounter: Payer: Self-pay | Admitting: Gastroenterology

## 2017-04-10 ENCOUNTER — Ambulatory Visit (INDEPENDENT_AMBULATORY_CARE_PROVIDER_SITE_OTHER): Payer: Medicare HMO | Admitting: Internal Medicine

## 2017-04-10 ENCOUNTER — Encounter: Payer: Self-pay | Admitting: Internal Medicine

## 2017-04-10 ENCOUNTER — Telehealth: Payer: Self-pay | Admitting: Internal Medicine

## 2017-04-10 VITALS — BP 118/70 | HR 52 | Temp 98.2°F | Ht 64.5 in | Wt 129.4 lb

## 2017-04-10 DIAGNOSIS — Z800 Family history of malignant neoplasm of digestive organs: Secondary | ICD-10-CM

## 2017-04-10 MED ORDER — POLYETHYLENE GLYCOL 3350 17 GM/SCOOP PO POWD: 17.0000 g | Freq: Two times a day (BID) | ORAL | 1 refills | Status: DC | PRN

## 2017-04-10 NOTE — Telephone Encounter (Signed)
Pt scheduled to see Dr. Kirtland BouchardK this afternoon.

## 2017-04-10 NOTE — Progress Notes (Signed)
Subjective:    Patient ID: Julie Duffy, female    DOB: 1946-04-19, 71 y.o.   MRN: [id number]  HPI  71 year old patient who has a long history of chronic constipation.  This is usually managed with prune juice, but more recently has not been affected.  She describes worsening constipation with some cramping gaseousness, nausea and anorexia She states she normally has a weekly bowel movement but presently has been 2 weeks since her last movement.  There is been no nausea, vomiting or abdominal distention  Past Medical History:  Diagnosis Date  . Allergy    seasonal  . Anxiety   . Hypothyroid   . Scoliosis      Social History   Social History  . Marital status: Divorced    Spouse name: N/A  . Number of children: 2  . 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 Smoking status: Never Smoker  . Smokeless tobacco: Never Used  . Alcohol use No  . Drug use: No  . Sexual activity: Not on file   Other Topics Concern  . Not on file   Social History Narrative  . No narrative on file    Past Surgical History:  Procedure Laterality Date  . APPENDECTOMY    . NASAL SINUS SURGERY    . NECK LESION BIOPSY     cysts benign  . TUBAL LIGATION    . UMBILICAL HERNIA REPAIR      Family History  Problem Relation Age of Onset  . Cancer Mother        colon ca  . Heart disease Mother   . Colon cancer Mother   . Thyroid disease Mother   . Cancer Father   . Thyroid disease Daughter     Allergies  Allergen Reactions  . Guaifenesin & Derivatives Nausea And Vomiting  . Clindamycin/Lincomycin Cross Reactors Other (See Comments)    GI intolerance  . Codeine Other (See Comments)    hallucinations    Current Outpatient Prescriptions on File Prior to Visit  Medication Sig Dispense Refill  . ALPRAZolam (XANAX) 0.5 MG tablet TAKE 1 TABLET TWICE DAILY 30 tablet 2  . amLODipine (NORVASC) 5 MG tablet TAKE 1 TABLET BY  MOUTH DAILY 90 tablet 3  . levothyroxine (SYNTHROID, LEVOTHROID) 88 MCG tablet Take 88-176 mcg by mouth See admin instructions. Take 2 tablets on Sunday then take 1 tablet all the other days     No current facility-administered medications on file prior to visit.     BP 118/70 (BP Location: Left Arm, Patient Position: Sitting, Cuff Size: Normal)   Pulse (!) 52   Temp 98.2 F (36.8 C) (Oral)   Ht 5' 4.5" (1.638 m)   Wt 129 lb 6.4 oz (58.7 kg)   SpO2 98%   BMI 21.87 kg/m      Review of Systems  Constitutional: Negative.   HENT: Negative for congestion, dental problem, hearing loss, rhinorrhea, sinus pressure, sore throat and tinnitus.   Eyes: Negative for pain, discharge and visual disturbance.  Respiratory: Negative for cough and shortness of breath.   Cardiovascular: Negative for chest pain, palpitations and leg swelling.  Gastrointestinal: Positive for abdominal pain, constipation and nausea. Negative for abdominal distention, blood in stool, diarrhea and vomiting.  Genitourinary: Negative for difficulty urinating, dysuria, flank pain, frequency, hematuria, pelvic pain, urgency, vaginal bleeding, vaginal discharge and vaginal pain.  Musculoskeletal: Negative for  arthralgias, gait problem and joint swelling.  Skin: Negative for rash.  Neurological: Negative for dizziness, syncope, speech difficulty, weakness, numbness and headaches.  Hematological: Negative for adenopathy.  Psychiatric/Behavioral: Negative for agitation, behavioral problems and dysphoric mood. The patient is not nervous/anxious.        Objective:   Physical Exam  Constitutional: She is oriented to person, place, and time. She appears well-developed and well-nourished. No distress.  HENT:  Head: Normocephalic.  Right Ear: External ear normal.  Left Ear: External ear normal.  Mouth/Throat: Oropharynx is clear and moist.  Appears well-hydrated  Eyes: Pupils are equal, round, and reactive to light.  Conjunctivae and EOM are normal.  Neck: Normal range of motion. Neck supple. No thyromegaly present.  Cardiovascular: Normal rate, regular rhythm, normal heart sounds and intact distal pulses.   Pulmonary/Chest: Effort normal and breath sounds normal.  Abdominal: Soft. Bowel sounds are normal. She exhibits no distension and no mass. There is no tenderness. There is no rebound and no guarding.  Bowel sounds were active  Musculoskeletal: Normal range of motion.  Lymphadenopathy:    She has no cervical adenopathy.  Neurological: She is alert and oriented to person, place, and time.  Skin: Skin is warm and dry. No rash noted.  Psychiatric: She has a normal mood and affect. Her behavior is normal.          Assessment & Plan:   Acute on chronic constipation.  General measures discussed and patient information supplied we'll attempt a high fiber diet more liberal fluid intake Trial polyethylene glycol Patient is scheduled for follow-up colonoscopy in about 6 weeks  We'll call if unimproved  Rogelia Boga

## 2017-04-10 NOTE — Patient Instructions (Addendum)
Constipation, Adult Constipation is when a person has fewer bowel movements in a week than normal, has difficulty having a bowel movement, or has stools that are dry, hard, or larger than normal. Constipation may be caused by an underlying condition. It may become worse with age if a person takes certain medicines and does not take in enough fluids. Follow these instructions at home: Eating and drinking   Eat foods that have a lot of fiber, such as fresh fruits and vegetables, whole grains, and beans.  Limit foods that are high in fat, low in fiber, or overly processed, such as french fries, hamburgers, cookies, candies, and soda.  Drink enough fluid to keep your urine clear or pale yellow. General instructions  Exercise regularly or as told by your health care provider.  Go to the restroom when you have the urge to go. Do not hold it in.  Take over-the-counter and prescription medicines only as told by your health care provider. These include any fiber supplements.  Practice pelvic floor retraining exercises, such as deep breathing while relaxing the lower abdomen and pelvic floor relaxation during bowel movements.  Watch your condition for any changes.  Keep all follow-up visits as told by your health care provider. This is important. Contact a health care provider if:  You have pain that gets worse.  You have a fever.  You do not have a bowel movement after 4 days.  You vomit.  You are not hungry.  You lose weight.  You are bleeding from the anus.  You have thin, pencil-like stools. Get help right away if:  You have a fever and your symptoms suddenly get worse.  You leak stool or have blood in your stool.  Your abdomen is bloated.  You have severe pain in your abdomen.  You feel dizzy or you faint.  Call or return to clinic prn if these symptoms worsen or fail to improve as anticipated.   Schedule your colonoscopy to help detect colon cancer.

## 2017-04-10 NOTE — Telephone Encounter (Signed)
[facility] - Client TELEPHONE ADVICE RECORD [facility]  Patient Name: Julie Duffy  DOB: 02-04-1946    Initial Comment Caller states she has GI issues, somewhat nauseated, BM has moved some each days with medication, gassy feelings in her stomach. She is wondering what to do.    Nurse Assessment      Guidelines    Guideline Title Affirmed Question Affirmed Notes       Final Disposition User   FINAL ATTEMPT MADE - no message left Odis LusterBowers, RN, Bjorn LoserRhonda    Comments  correct number per recording  "Call did not go thru" recording received. No alternate number given.

## 2017-04-16 ENCOUNTER — Telehealth: Payer: Self-pay

## 2017-04-16 NOTE — Telephone Encounter (Signed)
Pt was seen 9/6 by Dr. Kirtland BouchardK for constipation. She was advised to start Miralax. She took this for several days and her BM's have returned to normal. She now has issues with "a gassy feeling" and nausea. She was not eating for several days while constipated and has tried to slowly eat more but being nauseous is making it hard to do. She denies any fever or abdominal pain. She would like recommendations.  Dr. Kirtland BouchardK is not in office this afternoon.   Dr. Caryl NeverBurchette - Please advise. Thanks!

## 2017-04-16 NOTE — Telephone Encounter (Signed)
Is she still taking the Miralax?  If so, d/c.  If not offer follow up. Nausea is very non-specific and if persistent needs further evaluation.

## 2017-04-16 NOTE — Telephone Encounter (Signed)
Spoke with pt and advised. She has already stopped the Miralax. Advised she can try OTC anti-nausea medications and probiotic. She did schedule to see Dr. Kirtland BouchardK this Friday. Advised pt to contact office if symptoms increase/worsen or [facility]. Pt voiced understanding to all, she will call and cancel OV if improved. Nothing further needed at this time.

## 2017-04-18 ENCOUNTER — Ambulatory Visit: Payer: Medicare HMO | Admitting: Internal Medicine

## 2017-04-21 ENCOUNTER — Ambulatory Visit (INDEPENDENT_AMBULATORY_CARE_PROVIDER_SITE_OTHER): Payer: Medicare HMO | Admitting: Internal Medicine

## 2017-04-21 ENCOUNTER — Encounter: Payer: Self-pay | Admitting: Internal Medicine

## 2017-04-21 VITALS — BP 110/60 | HR 60 | Temp 98.0°F | Ht 64.5 in | Wt 129.6 lb

## 2017-04-21 DIAGNOSIS — D649 Anemia, unspecified: Secondary | ICD-10-CM

## 2017-04-21 DIAGNOSIS — R110 Nausea: Secondary | ICD-10-CM | POA: Diagnosis not present

## 2017-04-21 DIAGNOSIS — E039 Hypothyroidism, unspecified: Secondary | ICD-10-CM

## 2017-04-21 DIAGNOSIS — I10 Essential (primary) hypertension: Secondary | ICD-10-CM | POA: Diagnosis not present

## 2017-04-21 MED ORDER — ONDANSETRON HCL 4 MG PO TABS: 4.0000 mg | ORAL_TABLET | Freq: Three times a day (TID) | ORAL | 0 refills | Status: DC | PRN

## 2017-04-21 MED ORDER — PANTOPRAZOLE SODIUM 40 MG PO TBEC: 40.0000 mg | DELAYED_RELEASE_TABLET | Freq: Every day | ORAL | 3 refills | Status: DC

## 2017-04-21 NOTE — Progress Notes (Signed)
Subjective:    Patient ID: Julie Duffy, female    DOB: [birth year], 71 y.o.   MRN: [id number]  HPI 71 year old patient who is seen 11 days ago with constipation issues.  She has been using polyethylene glycol and occasional prune juice and has had some nice results.  In spite of improved constipation.  She continues to have bloating, intermittent abdominal pain as well as nausea. Symptoms have been present for about 2 weeks.  She is scheduled for follow-up colonoscopy next month and is on five-year intervals.  Due to a family history of colon cancer ( mother)  Past Medical History:  Diagnosis Date  . Allergy    seasonal  . Anxiety   . Hypothyroid   . Scoliosis      Social History   Social History  . Marital status: Divorced    Spouse name: N/A  . Number of children: 2  . 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 Smoking status: Never Smoker  . Smokeless tobacco: Never Used  . Alcohol use No  . Drug use: No  . Sexual activity: Not on file   Other Topics Concern  . Not on file   Social History Narrative  . No narrative on file    Past Surgical History:  Procedure Laterality Date  . APPENDECTOMY    . NASAL SINUS SURGERY    . NECK LESION BIOPSY     cysts benign  . TUBAL LIGATION    . UMBILICAL HERNIA REPAIR      Family History  Problem Relation Age of Onset  . Cancer Mother        colon ca  . Heart disease Mother   . Colon cancer Mother   . Thyroid disease Mother   . Cancer Father   . Thyroid disease Daughter     Allergies  Allergen Reactions  . Guaifenesin & Derivatives Nausea And Vomiting  . Clindamycin/Lincomycin Cross Reactors Other (See Comments)    GI intolerance  . Codeine Other (See Comments)    hallucinations    Current Outpatient Prescriptions on File Prior to Visit  Medication Sig Dispense Refill  . ALPRAZolam (XANAX) 0.5 MG tablet TAKE 1 TABLET TWICE DAILY 30  tablet 2  . amLODipine (NORVASC) 5 MG tablet TAKE 1 TABLET BY MOUTH DAILY 90 tablet 3  . levothyroxine (SYNTHROID, LEVOTHROID) 88 MCG tablet Take 88-176 mcg by mouth See admin instructions. Take 2 tablets on Sunday then take 1 tablet all the other days    . polyethylene glycol powder (GLYCOLAX/MIRALAX) powder Take 17 g by mouth 2 (two) times daily as needed. 3350 g 1   No current facility-administered medications on file prior to visit.     BP 110/60 (BP Location: Left Arm, Patient Position: Sitting, Cuff Size: Normal)   Pulse 60   Temp 98 F (36.7 C) (Oral)   Ht 5' 4.5" (1.638 m)   Wt 129 lb 9.3 oz (58.8 kg)   SpO2 97%   BMI 21.90 kg/m      Review of Systems  Constitutional: Positive for appetite change.  Gastrointestinal: Positive for abdominal pain, constipation and nausea.       Objective:   Physical Exam  Constitutional: She is oriented to person, place, and time. She appears well-developed and well-nourished.  HENT:  Head: Normocephalic.  Right Ear: External ear normal.  Left Ear: External ear normal.  Mouth/Throat: Oropharynx  is clear and moist.  Eyes: Pupils are equal, round, and reactive to light. Conjunctivae and EOM are normal.  Neck: Normal range of motion. Neck supple. No thyromegaly present.  Cardiovascular: Normal rate, regular rhythm, normal heart sounds and intact distal pulses.   Pulmonary/Chest: Effort normal and breath sounds normal.  Abdominal: Soft. Bowel sounds are normal. She exhibits no mass. There is tenderness.  Very mild diffuse tenderness  Musculoskeletal: Normal range of motion.  Lymphadenopathy:    She has no cervical adenopathy.  Neurological: She is alert and oriented to person, place, and time.  Skin: Skin is warm and dry. No rash noted.  Psychiatric: She has a normal mood and affect. Her behavior is normal.          Assessment & Plan:   History constipation.  Treated Persistent nausea, bloating, abdominal discomfort.  No  history of vomiting or abdominal distention. History depressed TSH  We'll place on empiric PPI therapy and treat with an antirheumatic as well as antireflux diet Colonoscopy as scheduled We'll check updated lab  Patient will report any new or worsening symptoms  Julie Duffy

## 2017-04-21 NOTE — Patient Instructions (Signed)
Avoids foods high in acid such as tomatoes citrus juices, and spicy foods.  Avoid eating within two hours of lying down or before exercising.  Do not overheat.  Try smaller more frequent meals.   Protonix 40 mg once daily  Colonoscopy as scheduled  Call or return to clinic prn if these symptoms worsen or fail to improve as anticipated.

## 2017-04-22 LAB — TSH: TSH: 0.98 u[IU]/mL (ref 0.35–4.50)

## 2017-04-22 LAB — CBC WITH DIFFERENTIAL/PLATELET
BASOS ABS: 0.1 10*3/uL (ref 0.0–0.1)
Basophils Relative: 1.1 % (ref 0.0–3.0)
Eosinophils Absolute: 0.3 10*3/uL (ref 0.0–0.7)
Eosinophils Relative: 4 % (ref 0.0–5.0)
HCT: 39.4 % (ref 36.0–46.0)
HEMOGLOBIN: 12.8 g/dL (ref 12.0–15.0)
LYMPHS ABS: 2.1 10*3/uL (ref 0.7–4.0)
Lymphocytes Relative: 29.6 % (ref 12.0–46.0)
MCHC: 32.6 g/dL (ref 30.0–36.0)
MCV: 89.8 fl (ref 78.0–100.0)
MONO ABS: 0.4 10*3/uL (ref 0.1–1.0)
Monocytes Relative: 6.2 % (ref 3.0–12.0)
NEUTROS PCT: 59.1 % (ref 43.0–77.0)
Neutro Abs: 4.2 10*3/uL (ref 1.4–7.7)
Platelets: 272 10*3/uL (ref 150.0–400.0)
RBC: 4.39 Mil/uL (ref 3.87–5.11)
RDW: 13.6 % (ref 11.5–15.5)
WBC: 7.1 10*3/uL (ref 4.0–10.5)

## 2017-04-22 LAB — COMPREHENSIVE METABOLIC PANEL
ALBUMIN: 4.2 g/dL (ref 3.5–5.2)
ALK PHOS: 67 U/L (ref 39–117)
ALT: 8 U/L (ref 0–35)
AST: 14 U/L (ref 0–37)
BILIRUBIN TOTAL: 0.8 mg/dL (ref 0.2–1.2)
BUN: 14 mg/dL (ref 6–23)
CO2: 30 mEq/L (ref 19–32)
Calcium: 9.7 mg/dL (ref 8.4–10.5)
Chloride: 102 mEq/L (ref 96–112)
Creatinine, Ser: 0.96 mg/dL (ref 0.40–1.20)
GFR: 60.69 mL/min (ref >60.00)
GLUCOSE: 87 mg/dL (ref 70–99)
POTASSIUM: 4.4 meq/L (ref 3.5–5.1)
SODIUM: 140 meq/L (ref 135–145)
TOTAL PROTEIN: 6.6 g/dL (ref 6.0–8.3)

## 2017-04-29 ENCOUNTER — Other Ambulatory Visit: Payer: Self-pay | Admitting: Internal Medicine

## 2017-05-21 ENCOUNTER — Ambulatory Visit (AMBULATORY_SURGERY_CENTER): Payer: Self-pay

## 2017-05-21 VITALS — Ht 64.5 in | Wt 129.4 lb

## 2017-05-21 DIAGNOSIS — Z1211 Encounter for screening for malignant neoplasm of colon: Secondary | ICD-10-CM

## 2017-05-21 MED ORDER — SUPREP BOWEL PREP KIT 17.5-3.13-1.6 GM/177ML PO SOLN: 1.0000 | Freq: Once | ORAL | 0 refills | Status: AC

## 2017-05-21 NOTE — Progress Notes (Signed)
No allergies to eggs or soy No diet med No home oxygen No past problems with anesthesia  Declined emmi  

## 2017-05-22 ENCOUNTER — Encounter: Payer: Self-pay | Admitting: Gastroenterology

## 2017-05-22 ENCOUNTER — Telehealth: Payer: Self-pay | Admitting: Gastroenterology

## 2017-05-22 DIAGNOSIS — Z1211 Encounter for screening for malignant neoplasm of colon: Secondary | ICD-10-CM

## 2017-05-22 MED ORDER — SUPREP BOWEL PREP KIT 17.5-3.13-1.6 GM/177ML PO SOLN: 1.0000 | Freq: Once | ORAL | 0 refills | Status: AC

## 2017-06-04 ENCOUNTER — Encounter: Payer: Self-pay | Admitting: Gastroenterology

## 2017-06-04 ENCOUNTER — Ambulatory Visit (AMBULATORY_SURGERY_CENTER): Payer: Medicare HMO | Admitting: Gastroenterology

## 2017-06-04 VITALS — BP 102/54 | HR 52 | Temp 97.8°F | Resp 10 | Ht 64.5 in | Wt 129.0 lb

## 2017-06-04 DIAGNOSIS — Z800 Family history of malignant neoplasm of digestive organs: Secondary | ICD-10-CM | POA: Diagnosis not present

## 2017-06-04 DIAGNOSIS — K649 Unspecified hemorrhoids: Secondary | ICD-10-CM | POA: Diagnosis not present

## 2017-06-04 DIAGNOSIS — E039 Hypothyroidism, unspecified: Secondary | ICD-10-CM | POA: Diagnosis not present

## 2017-06-04 DIAGNOSIS — Z1211 Encounter for screening for malignant neoplasm of colon: Secondary | ICD-10-CM | POA: Diagnosis present

## 2017-06-04 DIAGNOSIS — I10 Essential (primary) hypertension: Secondary | ICD-10-CM | POA: Diagnosis not present

## 2017-06-04 DIAGNOSIS — Z86010 Personal history of colonic polyps: Secondary | ICD-10-CM | POA: Diagnosis not present

## 2017-06-04 MED ORDER — SODIUM CHLORIDE 0.9 % IV SOLN: 500.0000 mL | INTRAVENOUS | Status: DC

## 2017-06-04 NOTE — Op Note (Signed)
[facility] Patient Name: Julie Duffy Procedure Date: 06/04/2017 9:37 AM MRN: [id number] Endoscopist: Rachael Fee , MD Age: 71 Referring MD:  Date of Birth: 18-Mar-1946 Gender: Female Account #: [id number] Procedure:                Colonoscopy Indications:              Screening in patient at increased risk: Family                            history of 1st-degree relative with colorectal                            cancer (mother in her 37s); Medicines:                Monitored Anesthesia Care Procedure:                Pre-Anesthesia Assessment:                           - Prior to the procedure, a History and Physical                            was performed, and patient medications and                            allergies were reviewed. The patient's tolerance of                            previous anesthesia was also reviewed. The risks                            and benefits of the procedure and the sedation                            options and risks were discussed with the patient.                            All questions were answered, and informed consent                            was obtained. Prior Anticoagulants: The patient has                            taken no previous anticoagulant or antiplatelet                            agents. ASA Grade Assessment: II - A patient with                            mild systemic disease. After reviewing the risks                            and benefits, the patient was deemed in  satisfactory condition to undergo the procedure.                           After obtaining informed consent, the colonoscope                            was passed under direct vision. Throughout the                            procedure, the patient's blood pressure, pulse, and                            oxygen saturations were monitored continuously. The                            Colonoscope was introduced through  the anus and                            advanced to the the cecum, identified by                            appendiceal orifice and ileocecal valve. The                            colonoscopy was performed without difficulty. The                            patient tolerated the procedure well. The quality                            of the bowel preparation was good. The ileocecal                            valve, appendiceal orifice, and rectum were                            photographed. Scope In: 9:54:18 AM Scope Out: 10:06:18 AM Scope Withdrawal Time: 0 hours 6 minutes 57 seconds  Total Procedure Duration: 0 hours 12 minutes 0 seconds  Findings:                 Small internal hemorrhoids.                           The exam was otherwise without abnormality on                            direct and retroflexion views. Complications:            No immediate complications. Estimated blood loss:                            None. Estimated Blood Loss:     Estimated blood loss: none. Impression:               - Internal hemorrhoids.                           -  The examination was otherwise normal on direct                            and retroflexion views.                           - No polyps or cancers. Recommendation:           - Patient has a contact number available for                            emergencies. The signs and symptoms of potential                            delayed complications were discussed with the                            patient. Return to normal activities tomorrow.                            Written discharge instructions were provided to the                            patient.                           - Resume previous diet.                           - Continue present medications.                           - Repeat colonoscopy in 5 years for screening                            purposes. Rachael Fee, MD 06/04/2017 10:08:33 AM This report has been  signed electronically.

## 2017-06-04 NOTE — Patient Instructions (Signed)
YOU HAD AN ENDOSCOPIC PROCEDURE TODAY AT THE [facility]:   Refer to the procedure report that was given to you for any specific questions about what was found during the examination.  If the procedure report does not answer your questions, please call your gastroenterologist to clarify.  If you requested that your care partner not be given the details of your procedure findings, then the procedure report has been included in a sealed envelope for you to review at your convenience later.  YOU SHOULD EXPECT: Some feelings of bloating in the abdomen. Passage of more gas than usual.  Walking can help get rid of the air that was put into your GI tract during the procedure and reduce the bloating. If you had a lower endoscopy (such as a colonoscopy or flexible sigmoidoscopy) you may notice spotting of blood in your stool or on the toilet paper. If you underwent a bowel prep for your procedure, you may not have a normal bowel movement for a few days.  Please Note:  You might notice some irritation and congestion in your nose or some drainage.  This is from the oxygen used during your procedure.  There is no need for concern and it should clear up in a day or so.  SYMPTOMS TO REPORT IMMEDIATELY:   Following lower endoscopy (colonoscopy or flexible sigmoidoscopy):  Excessive amounts of blood in the stool  Significant tenderness or worsening of abdominal pains  Swelling of the abdomen that is new, acute  Fever of 100F or higher    For urgent or emergent issues, a gastroenterologist can be reached at any hour by calling [phone].   DIET:  We do recommend a small meal at first, but then you may proceed to your regular diet.  Drink plenty of fluids but you should avoid alcoholic beverages for 24 hours.  ACTIVITY:  You should plan to take it easy for the rest of today and you should NOT DRIVE or use heavy machinery until tomorrow (because of the sedation medicines used during the test).     FOLLOW UP: Our staff will call the number listed on your records the next business day following your procedure to check on you and address any questions or concerns that you may have regarding the information given to you following your procedure. If we do not reach you, we will leave a message.  However, if you are feeling well and you are not experiencing any problems, there is no need to return our call.  We will assume that you have returned to your regular daily activities without incident.  If any biopsies were taken you will be contacted by phone or by letter within the next 1-3 weeks.  Please call us at [phone] if you have not heard about the biopsies in 3 weeks.    SIGNATURES/CONFIDENTIALITY: You and/or your care partner have signed paperwork which will be entered into your electronic medical record.  These signatures attest to the fact that that the information above on your After Visit Summary has been reviewed and is understood.  Full responsibility of the confidentiality of this discharge information lies with you and/or your care-partner.   Resume medications. Information given on hemorrhoids. 

## 2017-06-04 NOTE — Progress Notes (Signed)
Pt's states no medical or surgical changes since previsit or office visit. 

## 2017-06-04 NOTE — Progress Notes (Signed)
Report given to PACU, vss 

## 2017-06-05 ENCOUNTER — Telehealth: Payer: Self-pay

## 2017-06-05 NOTE — Telephone Encounter (Signed)
  Follow up Call-  Call back number 06/04/2017  Post procedure Call Back phone  # [phone]  Permission to leave phone message Yes  Some recent data might be hidden     Patient questions:  Do you have a fever, pain , or abdominal swelling? No. Pain Score  0 *  Have you tolerated food without any problems? Yes.    Have you been able to return to your normal activities? Yes.    Do you have any questions about your discharge instructions: Diet   No. Medications  No. Follow up visit  No.  Do you have questions or concerns about your Care? No.  Actions: * If pain score is 4 or above: No action needed, pain <4.
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
## 2017-10-15 DIAGNOSIS — R0789 Other chest pain: Secondary | ICD-10-CM | POA: Diagnosis not present

## 2017-10-16 ENCOUNTER — Emergency Department (HOSPITAL_COMMUNITY)
Admission: EM | Admit: 2017-10-16 | Discharge: 2017-10-16 | Disposition: A | Discharge status: Home / Self Care | Payer: Medicare HMO | Attending: Emergency Medicine | Admitting: Emergency Medicine

## 2017-10-16 ENCOUNTER — Encounter (HOSPITAL_COMMUNITY): Payer: Self-pay | Admitting: Emergency Medicine

## 2017-10-16 ENCOUNTER — Emergency Department (HOSPITAL_COMMUNITY): Payer: Medicare HMO

## 2017-10-16 DIAGNOSIS — R079 Chest pain, unspecified: Secondary | ICD-10-CM | POA: Diagnosis not present

## 2017-10-16 DIAGNOSIS — E039 Hypothyroidism, unspecified: Secondary | ICD-10-CM | POA: Insufficient documentation

## 2017-10-16 DIAGNOSIS — J302 Other seasonal allergic rhinitis: Secondary | ICD-10-CM | POA: Insufficient documentation

## 2017-10-16 DIAGNOSIS — I10 Essential (primary) hypertension: Secondary | ICD-10-CM | POA: Diagnosis not present

## 2017-10-16 DIAGNOSIS — R0789 Other chest pain: Secondary | ICD-10-CM | POA: Diagnosis not present

## 2017-10-16 DIAGNOSIS — Z79899 Other long term (current) drug therapy: Secondary | ICD-10-CM | POA: Insufficient documentation

## 2017-10-16 HISTORY — DX: Essential (primary) hypertension: I10

## 2017-10-16 LAB — CBC
HCT: 40.9 % (ref 36.0–46.0)
Hemoglobin: 13.2 g/dL (ref 12.0–15.0)
MCH: 29.5 pg (ref 26.0–34.0)
MCHC: 32.3 g/dL (ref 30.0–36.0)
MCV: 91.5 fL (ref 78.0–100.0)
Platelets: 291 10*3/uL (ref 150–400)
RBC: 4.47 MIL/uL (ref 3.87–5.11)
RDW: 13.8 % (ref 11.5–15.5)
WBC: 9.5 10*3/uL (ref 4.0–10.5)

## 2017-10-16 LAB — I-STAT TROPONIN, ED
Troponin i, poc: 0.01 ng/mL (ref 0.00–0.08)
Troponin i, poc: 0 ng/mL (ref 0.00–0.08)

## 2017-10-16 LAB — BASIC METABOLIC PANEL
Anion gap: 11 (ref 5–15)
BUN: 12 mg/dL (ref 6–20)
CO2: 26 mmol/L (ref 22–32)
Calcium: 9.8 mg/dL (ref 8.9–10.3)
Chloride: 100 mmol/L — ABNORMAL LOW (ref 101–111)
Creatinine, Ser: 1.23 mg/dL — ABNORMAL HIGH (ref 0.44–1.00)
GFR calc Af Amer: 50 mL/min — ABNORMAL LOW (ref >60)
GFR calc non Af Amer: 43 mL/min — ABNORMAL LOW (ref >60)
Glucose, Bld: 107 mg/dL — ABNORMAL HIGH (ref 65–99)
Potassium: 4.2 mmol/L (ref 3.5–5.1)
Sodium: 137 mmol/L (ref 135–145)

## 2017-10-16 LAB — TROPONIN I: Troponin I: <0.03 ng/mL (ref <0.03)

## 2017-10-16 MED ORDER — FAMOTIDINE 20 MG PO TABS
20.0000 mg | ORAL_TABLET | Freq: Once | ORAL | Status: AC
  Administered 2017-10-16: 20 mg via ORAL
  Filled 2017-10-16: qty 1

## 2017-10-16 MED ORDER — PANTOPRAZOLE SODIUM 20 MG PO TBEC: 20.0000 mg | DELAYED_RELEASE_TABLET | Freq: Two times a day (BID) | ORAL | 0 refills | Status: DC

## 2017-10-16 MED ORDER — GI COCKTAIL ~~LOC~~
30.0000 mL | Freq: Once | ORAL | Status: AC
  Administered 2017-10-16: 30 mL via ORAL
  Filled 2017-10-16: qty 30

## 2017-10-16 NOTE — ED Notes (Signed)
Explained delay to Pt. Pt tired but understanding and in good spirts

## 2017-10-16 NOTE — ED Notes (Signed)
Pt states pain radiates from chest to upper back and shoulder area.

## 2017-10-16 NOTE — ED Triage Notes (Signed)
Pt reports central CP onset within past hr. Pt describes as burning, 8/10., also reports nausea. Denies SOB, dizziness, lightheadedness. Denies hx of GERD.

## 2017-10-17 NOTE — ED Provider Notes (Signed)
[facility] EMERGENCY DEPARTMENT Provider Note   CSN: [id number] Arrival date & time: 10/16/17  0030     History   Chief Complaint Chief Complaint  Patient presents with  . Chest Pain    HPI Julie Duffy is a 72 y.o. female.  HPI   71yF with CP. Onset last night. Constant since then. Burning sensation in chest to epigastrium. No appreciable exacerbating or relieving factors. No cough or dyspnea. No nausea. No unusual leg pain or swelling. Denies past history of reflux/PUD.   Past Medical History:  Diagnosis Date  . Allergy    seasonal  . Anxiety   . Constipation   . Hypertension   . Hypothyroid   . Scoliosis     Patient Active Problem List   Diagnosis Date Noted  . Osteoarthritis of spine with radiculopathy, cervical region 08/16/2016  . Essential hypertension 09/28/2015  . Paresthesia and pain of extremity 09/02/2011  . VITILIGO 01/25/2008  . Hypothyroidism 07/16/2007    Past Surgical History:  Procedure Laterality Date  . APPENDECTOMY    . NASAL SINUS SURGERY    . NECK LESION BIOPSY     cysts benign  . TUBAL LIGATION    . UMBILICAL HERNIA REPAIR      OB History    No data available       Home Medications    Prior to Admission medications   Medication Sig Start Date End Date Taking? Authorizing Provider  aluminum-magnesium hydroxide-simethicone (MAALOX) 200-200-20 MG/5ML SUSP Take 30 mLs by mouth daily as needed (indigestion).   Yes [provider]  amLODipine (NORVASC) 5 MG tablet TAKE 1 TABLET BY MOUTH DAILY 11/04/16  Yes Gordy Savers, MD  bismuth subsalicylate (PEPTO BISMOL) 262 MG/15ML suspension Take 30 mLs by mouth every 6 (six) hours as needed for indigestion.   Yes [provider]  calcium carbonate (TUMS - DOSED IN MG ELEMENTAL CALCIUM) 500 MG chewable tablet Chew 1 tablet by mouth daily as needed for indigestion or heartburn.   Yes [provider]  levothyroxine (SYNTHROID, LEVOTHROID)  88 MCG tablet Take 88 mcg by mouth daily.    Yes [provider]  ALPRAZolam [name]) 0.5 MG tablet TAKE 1 TABLET TWICE DAILY Patient not taking: Reported on 06/04/2017 07/23/16   Gordy Savers, MD  pantoprazole (PROTONIX) 20 MG tablet Take 1 tablet (20 mg total) by mouth 2 (two) times daily. 10/16/17   Raeford Razor, MD    Family History Family History  Problem Relation Age of Onset  . Cancer Mother 43       colon ca  . Heart disease Mother   . Colon cancer Mother   . Thyroid disease Mother   . Cancer Father   . Thyroid disease Daughter   . Esophageal cancer Neg Hx   . Stomach cancer Neg Hx     Social History Social History   Tobacco Use  . Smoking status: Never Smoker  . Smokeless tobacco: Never Used  Substance Use Topics  . Alcohol use: No    Alcohol/week: 0.0 oz  . Drug use: No     Allergies   Guaifenesin & derivatives; Clindamycin/lincomycin cross reactors; Codeine; and Sulfa antibiotics   Review of Systems Review of Systems  All systems reviewed and negative, other than as noted in HPI.  Physical Exam Updated Vital Signs BP 122/63   Pulse (!) 45   Temp 98 F (36.7 C) (Oral)   Resp 14   Ht 5'  4" (1.626 m)   Wt 59 kg (130 lb)   SpO2 98%   BMI 22.31 kg/m   Physical Exam  Constitutional: She appears well-developed and well-nourished. No distress.  HENT:  Head: Normocephalic and atraumatic.  Eyes: Conjunctivae are normal. Right eye exhibits no discharge. Left eye exhibits no discharge.  Neck: Neck supple.  Cardiovascular: Normal rate, regular rhythm and normal heart sounds. Exam reveals no gallop and no friction rub.  No murmur heard. Pulmonary/Chest: Effort normal and breath sounds normal. No respiratory distress.  Abdominal: Soft. She exhibits no distension. There is no tenderness.  Musculoskeletal: She exhibits no edema or tenderness.  Neurological: She is alert.  Skin: Skin is warm and dry.  Psychiatric: She has a normal mood and  affect. Her behavior is normal. Thought content normal.  Nursing note and vitals reviewed.    ED Treatments / Results  Labs (all labs ordered are listed, but only abnormal results are displayed) Labs Reviewed  BASIC METABOLIC PANEL - Abnormal; Notable for the following components:      Result Value   Chloride 100 (*)    Glucose, Bld 107 (*)    Creatinine, Ser 1.23 (*)    GFR calc non Af Amer 43 (*)    GFR calc Af Amer 50 (*)    All other components within normal limits  CBC  TROPONIN I  I-STAT TROPONIN, ED  I-STAT TROPONIN, ED    EKG  EKG Interpretation  Date/Time:  Thursday October 16 2017 00:36:32 EDT Ventricular Rate:  66 PR Interval:  168 QRS Duration: 82 QT Interval:  430 QTC Calculation: 450 R Axis:   53 Text Interpretation:  Normal sinus rhythm with sinus arrhythmia Nonspecific ST abnormality Abnormal ECG When compared with ECG of 07/05/2016, No significant change was found Confirmed by Dione BoozeGlick, David ([id number]) on 10/16/2017 7:36:47 AM       Radiology Dg Chest 2 View  Result Date: 10/16/2017 CLINICAL DATA:  Chest pain EXAM: CHEST - 2 VIEW COMPARISON:  Chest radiograph 07/05/2016 FINDINGS: Hyperexpanded lungs with diffuse interstitial prominence. No pulmonary edema or focal airspace consolidation. No pneumothorax or pleural effusion. IMPRESSION: Hyperinflation without focal airspace disease. Electronically Signed   By: Deatra RobinsonKevin  Herman M.D.   On: 10/16/2017 01:22    Procedures Procedures (including critical care time)  Medications Ordered in ED Medications  gi cocktail (Maalox,Lidocaine,Donnatal) (30 mLs Oral Given 10/16/17 0125)  famotidine (PEPCID) tablet 20 mg (20 mg Oral Given 10/16/17 0912)  gi cocktail (Maalox,Lidocaine,Donnatal) (30 mLs Oral Given 10/16/17 0912)     Initial Impression / Assessment and Plan / ED Course  I have reviewed the triage vital signs and the nursing notes.  Pertinent labs & imaging results that were available during my care of the  patient were reviewed by me and considered in my medical decision making (see chart for details).     71yF with epigastric/CP.  I suspect GI in etiology. I doubt ACS, PE, dissection or other emergent process. CP constant. Troponin normal x2. EKG similar to previous. CXR w/o acute findings. Afebrile. No increased WOB. Abdominal exam benign. Will give trial of PPI.   Final Clinical Impressions(s) / ED Diagnoses   Final diagnoses:  Nonspecific chest pain    ED Discharge Orders        Ordered    pantoprazole (PROTONIX) 20 MG tablet  2 times daily     10/16/17 1121       Raeford RazorKohut, Nataleigh Griffin, MD 10/17/17 1005

## 2017-10-20 ENCOUNTER — Telehealth: Payer: Self-pay

## 2017-10-20 NOTE — Telephone Encounter (Signed)
Pt has been scheduled to see Dr. Kirtland BouchardK 10/22/17.    [facility] Patient Name: Julie Duffy Gender: Female DOB: [birth year] Age: [age] Y 7 M 22 D Return Phone Number: [phone] (Primary) Address: [place] [id number] Client [facility] Night - Client Client Site [facility] - Night Physician Derryl HarborKwiatkowski, Pete - MD Contact Type Call Who Is Calling Patient / Member / Family / Caregiver Call Type Triage / Clinical Caller Name Julie Duffy Relationship To Patient Self Return Phone Number [phone] (Primary) Chief Complaint Acid reflux/indigestion Reason for Call Symptomatic / Request for Health Information Initial Comment Caller states she had acid reflux in the hospital two days and was given medication and today it is not helping, having a flare up of acid reflux now. Nurse Assessment Nurse: Christell ConstantMoore, RN, Adela LankJacqueline Date/Time (Eastern Time): 10/18/2017 2:45:15 PM Confirm and document reason for call. If symptomatic, describe symptoms. ---Caller states she had acid reflux at the hospital two days and was given medication and today it is not helping, having a flare up of acid reflux now. Caller stated still having nausea and burning in chest. Care Advice Given Per Guideline SEE PHYSICIAN WITHIN 24 HOURS

## 2017-10-22 ENCOUNTER — Encounter: Payer: Self-pay | Admitting: Internal Medicine

## 2017-10-22 ENCOUNTER — Ambulatory Visit (INDEPENDENT_AMBULATORY_CARE_PROVIDER_SITE_OTHER): Payer: Medicare HMO | Admitting: Internal Medicine

## 2017-10-22 VITALS — BP 120/62 | HR 54 | Temp 98.0°F | Wt 126.0 lb

## 2017-10-22 DIAGNOSIS — R110 Nausea: Secondary | ICD-10-CM

## 2017-10-22 DIAGNOSIS — I10 Essential (primary) hypertension: Secondary | ICD-10-CM

## 2017-10-22 DIAGNOSIS — R1013 Epigastric pain: Secondary | ICD-10-CM | POA: Diagnosis not present

## 2017-10-22 MED ORDER — ONDANSETRON HCL 4 MG PO TABS: 4.0000 mg | ORAL_TABLET | Freq: Three times a day (TID) | ORAL | 0 refills | Status: DC | PRN

## 2017-10-22 NOTE — Progress Notes (Signed)
Subjective:    Patient ID: Julie Duffy, female    DOB: 1946/01/17, 72 y.o.   MRN: [id number]  HPI 72 year old patient who is seen today after a ED visit 6 days ago.  She presented with epigastric and chest discomfort described as a burning sensation.  Serial EKGs and cardiac enzymes were unremarkable.  In the ED setting she was treated with a GI cocktail with temporary benefit of the discomfort.  Over the past 6 days, she has had very little pain but has considerable nausea.  She describes some mild interscapular discomfort.  She has been on a very bland diet and discontinued caffeinated beverages.  No prior history of reflux ulcer disease and no prior cardiac history. Cardiovascular risk factors include a history of hypertension. She took Protonix for 3 days but discontinued due to worsening nausea  Past Medical History:  Diagnosis Date  . Allergy    seasonal  . Anxiety   . Constipation   . Hypertension   . Hypothyroid   . Scoliosis      Social History   Socioeconomic History  . Marital status: Divorced    Spouse name: Not on file  . Number of children: 2  . Years of education: Not on file  . Highest education level: Not on file  Social Needs  . Financial resource strain: Not on file  . Food insecurity - worry: Not on file  . Food insecurity - inability: Not on file  . Transportation needs - medical: Not on file  . Transportation needs - non-medical: Not on file  Occupational History  . Occupation: CMA    Employer: [employer]  Tobacco Use  . Smoking status: Never Smoker  . Smokeless tobacco: Never Used  Substance and Sexual Activity  . Alcohol use: No    Alcohol/week: 0.0 oz  . Drug use: No  . Sexual activity: Not on file  Other Topics Concern  . Not on file  Social History Narrative  . Not on file    Past Surgical History:  Procedure Laterality Date  . APPENDECTOMY    . NASAL SINUS SURGERY    . NECK LESION BIOPSY     cysts benign  . TUBAL  LIGATION    . UMBILICAL HERNIA REPAIR      Family History  Problem Relation Age of Onset  . Cancer Mother 64       colon ca  . Heart disease Mother   . Colon cancer Mother   . Thyroid disease Mother   . Cancer Father   . Thyroid disease Daughter   . Esophageal cancer Neg Hx   . Stomach cancer Neg Hx     Allergies  Allergen Reactions  . Guaifenesin & Derivatives Nausea And Vomiting  . Clindamycin/Lincomycin Cross Reactors Other (See Comments)    GI intolerance  . Codeine Other (See Comments)    hallucinations  . Sulfa Antibiotics Other (See Comments)    unspecified    Current Outpatient Medications on File Prior to Visit  Medication Sig Dispense Refill  . ALPRAZolam (XANAX) 0.5 MG tablet TAKE 1 TABLET TWICE DAILY 30 tablet 2  . aluminum-magnesium hydroxide-simethicone (MAALOX) 200-200-20 MG/5ML SUSP Take 30 mLs by mouth daily as needed (indigestion).    Marland Kitchen amLODipine (NORVASC) 5 MG tablet TAKE 1 TABLET BY MOUTH DAILY 90 tablet 3  . bismuth subsalicylate (PEPTO BISMOL) 262 MG/15ML suspension Take 30 mLs by mouth every 6 (six) hours as needed for indigestion.    Marland Kitchen  calcium carbonate (TUMS - DOSED IN MG ELEMENTAL CALCIUM) 500 MG chewable tablet Chew 1 tablet by mouth daily as needed for indigestion or heartburn.    . levothyroxine (SYNTHROID, LEVOTHROID) 88 MCG tablet Take 88 mcg by mouth daily.     . pantoprazole (PROTONIX) 20 MG tablet Take 1 tablet (20 mg total) by mouth 2 (two) times daily. 30 tablet 0   No current facility-administered medications on file prior to visit.     BP 120/62 (BP Location: Right Arm, Patient Position: Sitting, Cuff Size: Large)   Pulse (!) 54   Temp 98 F (36.7 C) (Oral)   Wt 126 lb (57.2 kg)   SpO2 99%   BMI 21.63 kg/m      Review of Systems  Constitutional: Negative.   HENT: Negative for congestion, dental problem, hearing loss, rhinorrhea, sinus pressure, sore throat and tinnitus.   Eyes: Negative for pain, discharge and visual  disturbance.  Respiratory: Negative for cough and shortness of breath.   Cardiovascular: Positive for chest pain. Negative for palpitations and leg swelling.  Gastrointestinal: Positive for abdominal pain and nausea. Negative for abdominal distention, blood in stool, constipation, diarrhea and vomiting.  Genitourinary: Negative for difficulty urinating, dysuria, flank pain, frequency, hematuria, pelvic pain, urgency, vaginal bleeding, vaginal discharge and vaginal pain.  Musculoskeletal: Negative for arthralgias, gait problem and joint swelling.  Skin: Negative for rash.  Neurological: Negative for dizziness, syncope, speech difficulty, weakness, numbness and headaches.  Hematological: Negative for adenopathy.  Psychiatric/Behavioral: Negative for agitation, behavioral problems and dysphoric mood. The patient is not nervous/anxious.        Objective:   Physical Exam  Constitutional: She is oriented to person, place, and time. She appears well-developed and well-nourished.  HENT:  Head: Normocephalic and atraumatic.  Right Ear: External ear normal.  Left Ear: External ear normal.  Mouth/Throat: Oropharynx is clear and moist.  Eyes: Conjunctivae and EOM are normal.  Neck: Normal range of motion. Neck supple. No JVD present. No thyromegaly present.  Cardiovascular: Normal rate, regular rhythm, normal heart sounds and intact distal pulses.  No murmur heard. Pulmonary/Chest: Effort normal and breath sounds normal. She has no wheezes. She has no rales.  Abdominal: Soft. Bowel sounds are normal. She exhibits no distension and no mass. There is no tenderness. There is no rebound and no guarding.  Very mild epigastric tenderness No right upper quadrant tenderness Bowel sounds normal  Musculoskeletal: Normal range of motion. She exhibits no edema or tenderness.  Neurological: She is alert and oriented to person, place, and time. She has normal reflexes. No cranial nerve deficit. She exhibits  normal muscle tone. Coordination normal.  Skin: Skin is warm and dry. No rash noted.  Psychiatric: She has a normal mood and affect. Her behavior is normal.          Assessment & Plan:   History of epigastric and chest pain.  Persistent nausea. History of hypertension  ED records reviewed  Patient had a bump in her creatinine from 0.96-1.3 with GFR down to 43 We will repeat and also review liver function studies.  Will check abdominal ultrasound.   Treat with antiemetic Continue PPI therapy  Rogelia BogaKWIATKOWSKI,Samayra Hebel FRANK

## 2017-10-22 NOTE — Patient Instructions (Signed)
Avoids foods high in acid such as tomatoes citrus juices, and spicy foods.  Avoid eating within two hours of lying down or before exercising.  Do not overheat.  Try smaller more frequent meals.  Abdominal ultrasound as discussed   Use medications to control nausea Resume Protonix follow-up 2 weeks

## 2017-10-23 LAB — COMPREHENSIVE METABOLIC PANEL
ALK PHOS: 77 U/L (ref 39–117)
ALT: 17 U/L (ref 0–35)
AST: 21 U/L (ref 0–37)
Albumin: 4.5 g/dL (ref 3.5–5.2)
BILIRUBIN TOTAL: 0.5 mg/dL (ref 0.2–1.2)
BUN: 11 mg/dL (ref 6–23)
CO2: 29 mEq/L (ref 19–32)
Calcium: 9.9 mg/dL (ref 8.4–10.5)
Chloride: 100 mEq/L (ref 96–112)
Creatinine, Ser: 1.14 mg/dL (ref 0.40–1.20)
GFR: 49.71 mL/min — ABNORMAL LOW (ref >60.00)
GLUCOSE: 89 mg/dL (ref 70–99)
Potassium: 4.4 mEq/L (ref 3.5–5.1)
SODIUM: 138 meq/L (ref 135–145)
TOTAL PROTEIN: 7.1 g/dL (ref 6.0–8.3)

## 2017-10-27 ENCOUNTER — Telehealth: Payer: Self-pay | Admitting: Internal Medicine

## 2017-10-27 NOTE — Telephone Encounter (Signed)
Please call/notify patient that lab/test/procedure is normal 

## 2017-10-27 NOTE — Telephone Encounter (Signed)
Called patient and left detailed message regarding labs.

## 2017-10-27 NOTE — Telephone Encounter (Signed)
Copied from CRM [phone]#[id number]. Topic: Quick Communication - Lab Results >> Oct 27, 2017 10:04 AM Waymon AmatoBurton, Donna F wrote: Pt is calling for lab results  -the labs were done on Thursday   Best [phone]

## 2017-10-29 ENCOUNTER — Other Ambulatory Visit: Payer: Self-pay | Admitting: Internal Medicine

## 2017-11-03 ENCOUNTER — Encounter: Payer: Self-pay | Admitting: Internal Medicine

## 2017-11-03 ENCOUNTER — Ambulatory Visit (INDEPENDENT_AMBULATORY_CARE_PROVIDER_SITE_OTHER): Payer: Medicare HMO | Admitting: Internal Medicine

## 2017-11-03 VITALS — BP 140/80 | HR 55 | Temp 98.3°F | Wt 127.0 lb

## 2017-11-03 DIAGNOSIS — K219 Gastro-esophageal reflux disease without esophagitis: Secondary | ICD-10-CM

## 2017-11-03 MED ORDER — PANTOPRAZOLE SODIUM 40 MG PO TBEC: 40.0000 mg | DELAYED_RELEASE_TABLET | Freq: Two times a day (BID) | ORAL | 2 refills | Status: DC

## 2017-11-03 NOTE — Patient Instructions (Signed)
GI consultation Dr. Christella HartiganJacobs as discussed  Increase Protonix to 40 mg twice daily  Continue antireflux diet

## 2017-11-03 NOTE — Progress Notes (Signed)
Subjective:    Patient ID: Julie Duffy, female    DOB: [birth year], 72 y.o.   MRN: [id number]  HPI  72 year old patient who is seen today for follow-up of gastroesophageal reflux disease.  She states that she had a significant episode of severe reflux associated with vomiting about 3 months ago.  She was seen in the ED 2 weeks ago with suspected symptomatic reflux.  She has been on PPI therapy twice daily and also bland diet without much benefit.  She remains significantly symptomatic with burning epigastric and substernal pain aggravated by meals and especially at bedtime.  She states that she must sit in a recliner to assist with sleep.  Her nausea has improved;  she states that she has a long history of IBS and has only weekly bowel movements.  This has been chronic and unchanged    Wt Readings from Last 3 Encounters:  11/03/17 127 lb (57.6 kg)  10/22/17 126 lb (57.2 kg)  10/16/17 130 lb (59 kg)    Past Medical History:  Diagnosis Date  . Allergy    seasonal  . Anxiety   . Constipation   . Hypertension   . Hypothyroid   . Scoliosis      Social History   Socioeconomic History  . Marital status: Divorced    Spouse name: Not on file  . Number of children: 2  . Years of education: Not on file  . Highest education level: Not on file  Occupational History  . Occupation: Ambulance person: [employer]  Social Needs  . Financial resource strain: Not on file  . Food insecurity:    Worry: Not on file    Inability: Not on file  . Transportation needs:    Medical: Not on file    Non-medical: Not on file  Tobacco Use  . Smoking status: Never Smoker  . Smokeless tobacco: Never Used  Substance and Sexual Activity  . Alcohol use: No    Alcohol/week: 0.0 oz  . Drug use: No  . Sexual activity: Not on file  Lifestyle  . Physical activity:    Days per week: Not on file    Minutes per session: Not on file  . Stress: Not on file  Relationships  . Social  connections:    Talks on phone: Not on file    Gets together: Not on file    Attends religious service: Not on file    Active member of club or organization: Not on file    Attends meetings of clubs or organizations: Not on file    Relationship status: Not on file  . Intimate partner violence:    Fear of current or ex partner: Not on file    Emotionally abused: Not on file    Physically abused: Not on file    Forced sexual activity: Not on file  Other Topics Concern  . Not on file  Social History Narrative  . Not on file    Past Surgical History:  Procedure Laterality Date  . APPENDECTOMY    . NASAL SINUS SURGERY    . NECK LESION BIOPSY     cysts benign  . TUBAL LIGATION    . UMBILICAL HERNIA REPAIR      Family History  Problem Relation Age of Onset  . Cancer Mother 2       colon ca  . Heart disease Mother   . Colon cancer Mother   . Thyroid  disease Mother   . Cancer Father   . Thyroid disease Daughter   . Esophageal cancer Neg Hx   . Stomach cancer Neg Hx     Allergies  Allergen Reactions  . Guaifenesin & Derivatives Nausea And Vomiting  . Clindamycin/Lincomycin Cross Reactors Other (See Comments)    GI intolerance  . Codeine Other (See Comments)    hallucinations  . Sulfa Antibiotics Other (See Comments)    unspecified    Current Outpatient Medications on File Prior to Visit  Medication Sig Dispense Refill  . ALPRAZolam (XANAX) 0.5 MG tablet TAKE 1 TABLET TWICE DAILY 30 tablet 2  . aluminum-magnesium hydroxide-simethicone (MAALOX) 200-200-20 MG/5ML SUSP Take 30 mLs by mouth daily as needed (indigestion).    Marland Kitchen. amLODipine (NORVASC) 5 MG tablet TAKE 1 TABLET BY MOUTH DAILY 90 tablet 3  . bismuth subsalicylate (PEPTO BISMOL) 262 MG/15ML suspension Take 30 mLs by mouth every 6 (six) hours as needed for indigestion.    . calcium carbonate (TUMS - DOSED IN MG ELEMENTAL CALCIUM) 500 MG chewable tablet Chew 1 tablet by mouth daily as needed for indigestion or  heartburn.    . levothyroxine (SYNTHROID, LEVOTHROID) 88 MCG tablet Take 88 mcg by mouth daily.     . pantoprazole (PROTONIX) 20 MG tablet Take 1 tablet (20 mg total) by mouth 2 (two) times daily. 30 tablet 0   No current facility-administered medications on file prior to visit.     BP 140/80 (BP Location: Right Arm, Patient Position: Sitting, Cuff Size: Normal)   Pulse (!) 55   Temp 98.3 F (36.8 C) (Oral)   Wt 127 lb (57.6 kg)   SpO2 93%   BMI 21.80 kg/m     Review of Systems     Objective:   Physical Exam  Constitutional: She is oriented to person, place, and time. She appears well-developed and well-nourished.  HENT:  Head: Normocephalic.  Right Ear: External ear normal.  Left Ear: External ear normal.  Mouth/Throat: Oropharynx is clear and moist.  Eyes: Pupils are equal, round, and reactive to light. Conjunctivae and EOM are normal.  Neck: Normal range of motion. Neck supple. No thyromegaly present.  Cardiovascular: Normal rate, regular rhythm, normal heart sounds and intact distal pulses.  Pulmonary/Chest: Effort normal and breath sounds normal.  Abdominal: Soft. Bowel sounds are normal. She exhibits no distension and no mass. There is no tenderness. There is no rebound and no guarding.  Musculoskeletal: Normal range of motion.  Lymphadenopathy:    She has no cervical adenopathy.  Neurological: She is alert and oriented to person, place, and time.  Skin: Skin is warm and dry. No rash noted.  Psychiatric: She has a normal mood and affect. Her behavior is normal.          Assessment & Plan:   Refractory reflux symptoms in spite of PPI therapy and aggressive antireflux measures.  Patient needs GI referral and EDG.  We will schedule; increase Protonix to 40 mg twice daily short-term  Tareek Sabo Doristine SectionFRANK Faria Casella FRANK

## 2017-11-04 ENCOUNTER — Other Ambulatory Visit: Payer: Self-pay | Admitting: Internal Medicine

## 2017-11-13 ENCOUNTER — Ambulatory Visit: Payer: Self-pay

## 2017-11-13 NOTE — Telephone Encounter (Signed)
Patient states that when she was taking the 20mg  twice a day she was fine. Patient was told to cut the medication in 1/2 and take OTC medication until the doctor can advise the issue. Pt verbalized understanding and stated that she may do the half dose until her apt with her GI specialist 11/20/2017.

## 2017-11-13 NOTE — Telephone Encounter (Signed)
Patient called in with c/o "medication reaction."  She says "the last time I saw the doctor he wanted me to take Protonix 40 mg twice a day. I usually took 20 mg twice a day. So yesterday evening, I took the first dose of 40 mg around 5 pm. I sat in the chair and dosed off to sleep. I woke up at 10 pm with ringing in my ears, face felt like it was burning up, the first part of my arms were on fire hot, my speech was garbled, my head felt extremely large, I was very nervous. I sat there and was afraid to get up not knowing what was happening. I talked to my daughter who told me to calm down and relax, so I took a 1/2 Xanax I had and was able to relax, the symptoms went away, except the ringing, and I was able to go to sleep the rest of the night. I woke up this morning still with some ringing in my ears, but no other symptoms. So, I called to ask if Dr. Kirtland BouchardK can send me in a prescription for the Protonix 20 mg twice a day, because clearly the 40 mg is too strong for me to take." I asked if she wanted to come in for a visit, she says "no, I don't feel like I need to because I know it was the large dose of the Protonix that brought all of this on. I also want him to reorder my Xanax, I can't remember the dosage, but it is a small dose. I believe once the medicine get out of my system the ringing will stop too." I advised this would be sent to Dr. Lesia HausenKwiatowski for review and someone will call with his recommendation, patient verbalized understanding.   Reason for Disposition . Caller has NON-URGENT medication question about med that PCP prescribed and triager unable to answer question  Answer Assessment - Initial Assessment Questions 1. SYMPTOMS: "Do you have any symptoms?"     Yes-ringing in ears 2. SEVERITY: If symptoms are present, ask "Are they mild, moderate or severe?"     Mild-moderate  Protocols used: MEDICATION QUESTION CALL-A-AH

## 2017-11-17 NOTE — Telephone Encounter (Signed)
Please contact patient and have her take Protonix 20 mg twice daily

## 2017-11-18 NOTE — Telephone Encounter (Signed)
Pt notified of results/instructions and verbalized understanding. Please advise alprazolam refill request.

## 2017-11-19 ENCOUNTER — Other Ambulatory Visit: Payer: Self-pay | Admitting: Internal Medicine

## 2017-11-19 ENCOUNTER — Telehealth: Payer: Self-pay | Admitting: Internal Medicine

## 2017-11-19 NOTE — Telephone Encounter (Signed)
Copied from CRM [phone]. Topic: Quick Communication - Rx Refill/Question >> Nov 19, 2017 10:58 AM Mickel BaasMcGee, Amenah Tucci B, NT wrote: Medication: ALPRAZolam [name](XANAX) 0.5 MG tablet --patient completely out. Has the patient contacted their pharmacy? Yes.   (Agent: If no, request that the patient contact the pharmacy for the refill.) Preferred Pharmacy (with phone number or street name): [id number] - [place], [place] - [address] Agent: Please be advised that RX refills may take up to 3 business days. We ask that you follow-up with your pharmacy.

## 2017-11-20 ENCOUNTER — Other Ambulatory Visit: Payer: Self-pay | Admitting: Internal Medicine

## 2017-11-20 ENCOUNTER — Encounter (INDEPENDENT_AMBULATORY_CARE_PROVIDER_SITE_OTHER): Payer: Self-pay

## 2017-11-20 ENCOUNTER — Ambulatory Visit: Payer: Medicare HMO | Admitting: Physician Assistant

## 2017-11-20 ENCOUNTER — Encounter: Payer: Self-pay | Admitting: Physician Assistant

## 2017-11-20 VITALS — BP 120/80 | HR 58 | Ht 64.0 in | Wt 126.0 lb

## 2017-11-20 DIAGNOSIS — R1013 Epigastric pain: Secondary | ICD-10-CM | POA: Diagnosis not present

## 2017-11-20 DIAGNOSIS — R110 Nausea: Secondary | ICD-10-CM | POA: Diagnosis not present

## 2017-11-20 DIAGNOSIS — K5909 Other constipation: Secondary | ICD-10-CM | POA: Diagnosis not present

## 2017-11-20 DIAGNOSIS — K219 Gastro-esophageal reflux disease without esophagitis: Secondary | ICD-10-CM | POA: Diagnosis not present

## 2017-11-20 MED ORDER — RANITIDINE HCL 150 MG PO TABS: 150.0000 mg | ORAL_TABLET | Freq: Two times a day (BID) | ORAL | 3 refills | Status: DC

## 2017-11-20 MED ORDER — OMEPRAZOLE 20 MG PO CPDR: 20.0000 mg | DELAYED_RELEASE_CAPSULE | Freq: Every day | ORAL | 3 refills | Status: DC

## 2017-11-20 MED ORDER — AMBULATORY NON FORMULARY MEDICATION: 0 refills | Status: DC

## 2017-11-20 NOTE — Patient Instructions (Signed)
We have sent the following medications to your pharmacy for you to pick up at your convenience: Omeprazole 20 mg twice a day  Zantac 150 mg twice a day  Gi cocktail 5-10 mL as needed for pain.   You have been scheduled for an endoscopy. Please follow written instructions given to you at your visit today. If you use inhalers (even only as needed), please bring them with you on the day of your procedure. Your physician has requested that you go to www.startemmi.com and enter the access code given to you at your visit today. This web site gives a general overview about your procedure. However, you should still follow specific instructions given to you by our office regarding your preparation for the procedure.

## 2017-11-20 NOTE — Progress Notes (Signed)
Chief Complaint: GERD, nausea  HPI:    Julie Duffy is a 72 year old female with a past medical history as listed below, who follows with Dr. Christella Hartigan and who was referred to me by Gordy Savers, MD for a complaint of GERD and nausea.      06/04/17 colonoscopy with internal hemorrhoids and otherwise normal.  Repeat recommended in 5 years due to family history of colon cancer.    Today, explains that 2 months ago she started with reflux and a burning sensation in her stomach as well as up into her esophagus accompanied by nausea at times.  Initially seen in the ER for this pain and cardiac etiology was ruled out.  Started on Protonix 20 mg twice daily for 14 days which "did not really do anything".  Saw PCP who increased her to 40 mg twice daily.  Took one 40 mg tablet before going to bed and woke up about 2 hours later and felt like I had "bees in my head, I was burning up and my speech was garbled".  She stopped the Pantoprazole at that time for fear of side effects on April 10th.  She has not been on a PPI since, has been using Pepto, Mylanta and Tums as well as some ginger tea occasionally to help with symptoms.  Patient has to sit up at night and cannot lay down.  She is afraid to eat and has not been eating much due to increase of symptoms afterwards.    Describes chronic constipation which is helped with a cup and a half of prune juice on Saturdays.    Denies fever, chills, blood in her stool, melena, change in bowel habits or dysphagia.  Past Medical History:  Diagnosis Date  . Allergy    seasonal  . Anxiety   . Constipation   . Hypertension   . Hypothyroid   . Scoliosis     Past Surgical History:  Procedure Laterality Date  . APPENDECTOMY    . NASAL SINUS SURGERY    . NECK LESION BIOPSY     cysts benign  . TUBAL LIGATION    . UMBILICAL HERNIA REPAIR      Current Outpatient Medications  Medication Sig Dispense Refill  . ALPRAZolam (XANAX) 0.5 MG tablet TAKE 1 TABLET  TWICE DAILY 30 tablet 2  . aluminum-magnesium hydroxide-simethicone (MAALOX) 200-200-20 MG/5ML SUSP Take 30 mLs by mouth daily as needed (indigestion).    Marland Kitchen amLODipine (NORVASC) 5 MG tablet TAKE 1 TABLET BY MOUTH DAILY 90 tablet 3  . bismuth subsalicylate (PEPTO BISMOL) 262 MG/15ML suspension Take 30 mLs by mouth every 6 (six) hours as needed for indigestion.    . calcium carbonate (TUMS - DOSED IN MG ELEMENTAL CALCIUM) 500 MG chewable tablet Chew 1 tablet by mouth daily as needed for indigestion or heartburn.    . levothyroxine (SYNTHROID, LEVOTHROID) 88 MCG tablet TAKE 1 TABLET BY MOUTH EVERY DAY 90 tablet 1  . pantoprazole (PROTONIX) 40 MG tablet Take 1 tablet (40 mg total) by mouth 2 (two) times daily. 60 tablet 2  . AMBULATORY NON FORMULARY MEDICATION Medication Name: Gi cocktail 5-10 ml every 4-6 hours as needed for pain 450 mL 0  . omeprazole (PRILOSEC) 20 MG capsule Take 1 capsule (20 mg total) by mouth daily. 90 capsule 3  . ranitidine (ZANTAC) 150 MG tablet Take 1 tablet (150 mg total) by mouth 2 (two) times daily. 60 tablet 3   No current facility-administered medications for this visit.  Allergies as of 11/20/2017 - Review Complete 11/20/2017  Allergen Reaction Noted  . Guaifenesin & derivatives Nausea And Vomiting 09/23/2012  . Clindamycin/lincomycin cross reactors Other (See Comments) 10/08/2010  . Codeine Other (See Comments)   . Sulfa antibiotics Other (See Comments)     Family History  Problem Relation Age of Onset  . Cancer Mother [id number]       colon ca  . Heart disease Mother   . Colon cancer Mother   . Thyroid disease Mother   . Cancer Father   . Thyroid disease Daughter   . Esophageal cancer Neg Hx   . Stomach cancer Neg Hx     Social History   Socioeconomic History  . Marital status: Divorced    Spouse name: Not on file  . Number of children: 2  . Years of education: Not on file  . Highest education level: Not on file  Occupational History  .  Occupation: Ambulance personCMA    Employer: [employer]  Social Needs  . Financial resource strain: Not on file  . Food insecurity:    Worry: Not on file    Inability: Not on file  . Transportation needs:    Medical: Not on file    Non-medical: Not on file  Tobacco Use  . Smoking status: Never Smoker  . Smokeless tobacco: Never Used  Substance and Sexual Activity  . Alcohol use: No    Alcohol/week: 0.0 oz  . Drug use: No  . Sexual activity: Not on file  Lifestyle  . Physical activity:    Days per week: Not on file    Minutes per session: Not on file  . Stress: Not on file  Relationships  . Social connections:    Talks on phone: Not on file    Gets together: Not on file    Attends religious service: Not on file    Active member of club or organization: Not on file    Attends meetings of clubs or organizations: Not on file    Relationship status: Not on file  . Intimate partner violence:    Fear of current or ex partner: Not on file    Emotionally abused: Not on file    Physically abused: Not on file    Forced sexual activity: Not on file  Other Topics Concern  . Not on file  Social History Narrative  . Not on file    Review of Systems:    Constitutional: No weight loss, fever or chills Skin: No rash  Cardiovascular: No chest pain  Respiratory: No SOB Gastrointestinal: See HPI and otherwise negative Genitourinary: No dysuria  Neurological: No headache Musculoskeletal: No new muscle or joint pain Hematologic: No bleeding  Psychiatric: No history of depression or anxiety   Physical Exam:  Vital signs: BP 120/80   Pulse (!) 58   Ht 5\' 4"  (1.626 m)   Wt 126 lb (57.2 kg)   BMI 21.63 kg/m   Constitutional:   Pleasant Caucasian female appears to be in NAD, Well developed, Well nourished, alert and cooperative Head:  Normocephalic and atraumatic. Eyes:   PEERL, EOMI. No icterus. Conjunctiva pink. Ears:  Normal auditory acuity. Neck:  Supple Throat: Oral cavity and  pharynx without inflammation, swelling or lesion.  Respiratory: Respirations even and unlabored. Lungs clear to auscultation bilaterally.   No wheezes, crackles, or rhonchi.  Cardiovascular: Normal S1, S2. No MRG. Regular rate and rhythm. No peripheral edema, cyanosis or pallor.  Gastrointestinal:  Soft,  nondistended, mild epigastric ttp, No rebound or guarding. Normal bowel sounds. No appreciable masses or hepatomegaly. Rectal:  Not performed.  Msk:  Symmetrical without gross deformities. Without edema, no deformity or joint abnormality.  Neurologic:  Alert and  oriented x4;  grossly normal neurologically.  Skin:   Dry and intact without significant lesions or rashes. Psychiatric: Demonstrates good judgement and reason without abnormal affect or behaviors.  MOST RECENT LABS AND IMAGING: CBC    Component Value Date/Time   WBC 9.5 10/16/2017 0052   RBC 4.47 10/16/2017 0052   HGB 13.2 10/16/2017 0052   HCT 40.9 10/16/2017 0052   PLT 291 10/16/2017 0052   MCV 91.5 10/16/2017 0052   MCH 29.5 10/16/2017 0052   MCHC 32.3 10/16/2017 0052   RDW 13.8 10/16/2017 0052   LYMPHSABS 2.1 04/21/2017 1614   MONOABS 0.4 04/21/2017 1614   EOSABS 0.3 04/21/2017 1614   BASOSABS 0.1 04/21/2017 1614    CMP     Component Value Date/Time   NA 138 10/22/2017 1513   K 4.4 10/22/2017 1513   CL 100 10/22/2017 1513   CO2 29 10/22/2017 1513   GLUCOSE 89 10/22/2017 1513   BUN 11 10/22/2017 1513   CREATININE 1.14 10/22/2017 1513   CALCIUM 9.9 10/22/2017 1513   PROT 7.1 10/22/2017 1513   ALBUMIN 4.5 10/22/2017 1513   AST 21 10/22/2017 1513   ALT 17 10/22/2017 1513   ALKPHOS 77 10/22/2017 1513   BILITOT 0.5 10/22/2017 1513   GFRNONAA 43 (L) 10/16/2017 0052   GFRAA 50 (L) 10/16/2017 0052   Assessment: 1.  GERD: Unhelped by Pantoprazole 20 mg twice daily, described reaction to 40 mg, also with nausea; consider GERD versus gastritis versus H. pylori  2.  Nausea: With above 3.  Chronic constipation:  Helped by prune juice per patient 4. Epigastric pain  Plan: 1.  Due to severity of symptoms, un-helped by twice daily PPI, recommend an EGD for further evaluation.  Did discuss risks, benefits, limitations and alternatives and patient agrees to proceed.  Scheduled with Dr. Christella Hartigan in the [facility]. 2.  Reviewed antireflux diet and lifestyle modifications. 3.  Prescribed Omeprazole 20 mg twice daily, 30-60 minutes before eating breakfast and dinner #60 with 3 refills 4.  Prescribed Zantac 150 mg twice daily, every morning and nightly #60 with 3 refills 4.  Prescribed GI cocktail 5-10 mL's every 4 hours as needed for breakthrough symptoms 5.  Patient will follow in clinic per recommendations from Dr. Christella Hartigan after time of procedure  Hyacinth Meeker, PA-C [facility] 11/20/2017, 12:19 PM  Cc: Gordy Savers, MD

## 2017-11-20 NOTE — Telephone Encounter (Signed)
Julie Duffy, Temeka L, RMA 11/20/2017 12:06 PM  Patient is calling to check status of medication refill for Xanax please send to pharmacy

## 2017-11-20 NOTE — Telephone Encounter (Signed)
Medication has been faxed

## 2017-11-21 NOTE — Progress Notes (Signed)
I agree with the above note, plan 

## 2017-11-29 ENCOUNTER — Other Ambulatory Visit: Payer: Self-pay | Admitting: Internal Medicine

## 2017-12-22 ENCOUNTER — Encounter: Payer: Self-pay | Admitting: Gastroenterology

## 2017-12-26 ENCOUNTER — Telehealth: Payer: Self-pay | Admitting: Internal Medicine

## 2017-12-26 NOTE — Telephone Encounter (Signed)
Copied from CRM [id number]. Topic: Quick Communication - See Telephone Encounter >> Dec 26, 2017 10:14 AM Cipriano Bunker wrote: CRM for notification. See Telephone encounter for: 12/26/17.  Pt called and said [employer] said today that this needs to have a PA. She has an appointment on 5/28 Please call pt when done

## 2017-12-30 ENCOUNTER — Encounter: Payer: Self-pay | Admitting: Gastroenterology

## 2017-12-30 ENCOUNTER — Ambulatory Visit (AMBULATORY_SURGERY_CENTER): Payer: Medicare HMO | Admitting: Gastroenterology

## 2017-12-30 VITALS — BP 124/64 | HR 53 | Temp 98.4°F | Resp 15 | Ht 64.0 in | Wt 126.0 lb

## 2017-12-30 DIAGNOSIS — K2970 Gastritis, unspecified, without bleeding: Secondary | ICD-10-CM

## 2017-12-30 DIAGNOSIS — K219 Gastro-esophageal reflux disease without esophagitis: Secondary | ICD-10-CM

## 2017-12-30 DIAGNOSIS — K2950 Unspecified chronic gastritis without bleeding: Secondary | ICD-10-CM | POA: Diagnosis not present

## 2017-12-30 DIAGNOSIS — B9681 Helicobacter pylori [H. pylori] as the cause of diseases classified elsewhere: Secondary | ICD-10-CM | POA: Diagnosis not present

## 2017-12-30 MED ORDER — SODIUM CHLORIDE 0.9 % IV SOLN: 500.0000 mL | Freq: Once | INTRAVENOUS | Status: DC

## 2017-12-30 NOTE — Op Note (Signed)
[facility] Patient Name: Julie Duffy Procedure Date: 12/30/2017 1:23 PM MRN: [id number] Endoscopist: Rachael Fee , MD Age: 72 Referring MD:  Date of Birth: [birth year] Gender: Female Account #: [id number] Procedure:                Upper GI endoscopy Indications:              Dyspepsia, Heartburn Medicines:                Monitored Anesthesia Care Procedure:                Pre-Anesthesia Assessment:                           - Prior to the procedure, a History and Physical                            was performed, and patient medications and                            allergies were reviewed. The patient's tolerance of                            previous anesthesia was also reviewed. The risks                            and benefits of the procedure and the sedation                            options and risks were discussed with the patient.                            All questions were answered, and informed consent                            was obtained. Prior Anticoagulants: The patient has                            taken no previous anticoagulant or antiplatelet                            agents. ASA Grade Assessment: II - A patient with                            mild systemic disease. After reviewing the risks                            and benefits, the patient was deemed in                            satisfactory condition to undergo the procedure.                           After obtaining informed consent, the endoscope was  passed under direct vision. Throughout the                            procedure, the patient's blood pressure, pulse, and                            oxygen saturations were monitored continuously. The                            Model GIF-HQ190 [id number]) scope was introduced                            through the mouth, and advanced to the second part                            of duodenum. The upper GI  endoscopy was                            accomplished without difficulty. The patient                            tolerated the procedure well. Scope In: Scope Out: Findings:                 Moderate inflammation characterized by erythema and                            friability was found in the entire examined                            stomach. Biopsies were taken with a cold forceps                            for histology.                           The exam was otherwise without abnormality. Complications:            No immediate complications. Estimated blood loss:                            None. Estimated Blood Loss:     Estimated blood loss: none. Impression:               - Gastritis. Biopsied.                           - The examination was otherwise normal. Recommendation:           - Patient has a contact number available for                            emergencies. The signs and symptoms of potential                            delayed complications were discussed with the  patient. Return to normal activities tomorrow.                            Written discharge instructions were provided to the                            patient.                           - Resume previous diet.                           - Continue present medications. For now, please                            resume zantac  pill, one pill at bedtime                            nightly.                           - Await pathology results. If negative for H.                            pylori, will likely recommend further testing (CT                            scan abd/pelvis) Rachael Fee, MD 12/30/2017 1:36:02 PM This report has been signed electronically.

## 2017-12-30 NOTE — Progress Notes (Signed)
Pt's states no medical or surgical changes since previsit or office visit. 

## 2017-12-30 NOTE — Progress Notes (Signed)
Spontaneous respirations throughout. VSS. Resting comfortably. To PACU on room air. Report to  RN. 

## 2017-12-30 NOTE — Patient Instructions (Signed)
YOU HAD AN ENDOSCOPIC PROCEDURE TODAY AT THE [facility]:   Refer to the procedure report that was given to you for any specific questions about what was found during the examination.  If the procedure report does not answer your questions, please call your gastroenterologist to clarify.  If you requested that your care partner not be given the details of your procedure findings, then the procedure report has been included in a sealed envelope for you to review at your convenience later.  YOU SHOULD EXPECT: Some feelings of bloating in the abdomen. Passage of more gas than usual.  Walking can help get rid of the air that was put into your GI tract during the procedure and reduce the bloating. If you had a lower endoscopy (such as a colonoscopy or flexible sigmoidoscopy) you may notice spotting of blood in your stool or on the toilet paper. If you underwent a bowel prep for your procedure, you may not have a normal bowel movement for a few days.  Please Note:  You might notice some irritation and congestion in your nose or some drainage.  This is from the oxygen used during your procedure.  There is no need for concern and it should clear up in a day or so.  SYMPTOMS TO REPORT IMMEDIATELY:    Following upper endoscopy (EGD)  Vomiting of blood or coffee ground material  New chest pain or pain under the shoulder blades  Painful or persistently difficult swallowing  New shortness of breath  Fever of 100F or higher  Black, tarry-looking stools  For urgent or emergent issues, a gastroenterologist can be reached at any hour by calling [phone].  Please resume zantac 150 mg daily at bedtime.  DIET:  We do recommend a small meal at first, but then you may proceed to your regular diet.  Drink plenty of fluids but you should avoid alcoholic beverages for 24 hours.  ACTIVITY:  You should plan to take it easy for the rest of today and you should NOT DRIVE or use heavy machinery until  tomorrow (because of the sedation medicines used during the test).    FOLLOW UP: Our staff will call the number listed on your records the next business day following your procedure to check on you and address any questions or concerns that you may have regarding the information given to you following your procedure. If we do not reach you, we will leave a message.  However, if you are feeling well and you are not experiencing any problems, there is no need to return our call.  We will assume that you have returned to your regular daily activities without incident.  If any biopsies were taken you will be contacted by phone or by letter within the next 1-3 weeks.  Please call us at [phone] if you have not heard about the biopsies in 3 weeks.    SIGNATURES/CONFIDENTIALITY: You and/or your care partner have signed paperwork which will be entered into your electronic medical record.  These signatures attest to the fact that that the information above on your After Visit Summary has been reviewed and is understood.  Full responsibility of the confidentiality of this discharge information lies with you and/or your care-partner.  Thank you for letting us take care of your healthcare needs today.

## 2017-12-30 NOTE — Progress Notes (Signed)
Called to room to assist during endoscopic procedure.  Patient ID and intended procedure confirmed with present staff. Received instructions for my participation in the procedure from the performing physician.  

## 2017-12-31 ENCOUNTER — Telehealth: Payer: Self-pay

## 2017-12-31 NOTE — Telephone Encounter (Signed)
Tried calling the pt to elaborate on which Rx needed a prior auth but no message. So I waited until her appointment date to ask her but she was a no show. I will call again later.

## 2017-12-31 NOTE — Telephone Encounter (Signed)
  Follow up Call-  Call Illene Sweeting number 12/30/2017 06/04/2017  Post procedure Call Aubra Pappalardo phone  # [phone]  Permission to leave phone message Yes Yes  Some recent data might be hidden     Patient questions:  Do you have a fever, pain , or abdominal swelling? No. Pain Score  0 *  Have you tolerated food without any problems? Yes.    Have you been able to return to your normal activities? Yes.    Do you have any questions about your discharge instructions: Diet   No. Medications  No. Follow up visit  No.  Do you have questions or concerns about your Care? No.  Actions: * If pain score is 4 or above: No action needed, pain <4.

## 2018-01-02 ENCOUNTER — Other Ambulatory Visit: Payer: Self-pay

## 2018-01-02 NOTE — Telephone Encounter (Signed)
Left message to return phone call.

## 2018-01-06 ENCOUNTER — Telehealth: Payer: Self-pay | Admitting: Gastroenterology

## 2018-01-06 MED ORDER — BIS SUBCIT-METRONID-TETRACYC 140-125-125 MG PO CAPS: 3.0000 | ORAL_CAPSULE | Freq: Three times a day (TID) | ORAL | 0 refills | Status: DC

## 2018-01-06 NOTE — Telephone Encounter (Signed)
-----   Message from Rachael Feeaniel P Jacobs, MD sent at 01/06/2018  7:30 AM EDT -----  Tresa EndoKelly, Biopsies from stomach are positive for H. Pylori.  Please call in a 14 day course of Pylera.   ROV with me in 3 months.  Thanks  Clydie BraunKaren, no result note is needed.

## 2018-01-06 NOTE — Telephone Encounter (Signed)
I spoke to patient informed her of results and sent in RX for Pylera. She has a follow in office visit 04/03/18

## 2018-01-07 ENCOUNTER — Telehealth: Payer: Self-pay | Admitting: Gastroenterology

## 2018-01-07 ENCOUNTER — Ambulatory Visit: Payer: Medicare HMO | Admitting: Gastroenterology

## 2018-01-07 NOTE — Telephone Encounter (Signed)
The pt wanted confirmation that she needs to take pylera for 14 days.  We discussed how she should take the medication and all questions answered.  She will call back if she further concerns.

## 2018-01-08 MED ORDER — TETRACYCLINE HCL 500 MG PO CAPS: 500.0000 mg | ORAL_CAPSULE | Freq: Four times a day (QID) | ORAL | 0 refills | Status: AC

## 2018-01-08 MED ORDER — BISMUTH SUBSALICYLATE 262 MG PO TABS: 524.0000 mg | ORAL_TABLET | Freq: Four times a day (QID) | ORAL | 0 refills | Status: AC

## 2018-01-08 MED ORDER — METRONIDAZOLE 500 MG PO TABS: 500.0000 mg | ORAL_TABLET | Freq: Four times a day (QID) | ORAL | 0 refills | Status: AC

## 2018-01-08 NOTE — Telephone Encounter (Signed)
The pt has been advised and will pick up prescription today.  She was also advised to take omeprazole BID for 14 days and told to avoid ETOH

## 2018-01-08 NOTE — Telephone Encounter (Signed)
She did and I sent some rec's.   If Pylera is cost prohibitive, then please call in it's component parts (each for 14 days) as follows: Bismuth subsalicylate 262mg  pills, two pills QID; Metronidazoe 500mg  pill,one pill QID; Tetracycline 500mg  pill, one pill QID.  At the same time they need to be on twice daily PPI.  If currently only on once daily PPI then they should supplement with OTC omeprazole 20mg  pill daily as well (12 hours after the first PPI dose).

## 2018-01-08 NOTE — Telephone Encounter (Signed)
Dr Christella HartiganJacobs did Tresa EndoKelly contact you about alternative to Pylera due to cost?  Can you please send a break down of what you wish to use.  I will make sure Tresa EndoKelly has a copy of what the alternative is for the future.

## 2018-01-10 ENCOUNTER — Telehealth: Payer: Self-pay | Admitting: Physician Assistant

## 2018-01-10 NOTE — Telephone Encounter (Signed)
c/o burning in esophagus/stomach after tetracycline this AM.  advised swallowing with applesauce or yogurt, chacing with liquids.  also advised starting omeprozole 20 mg bid along with abx, she was unaware of this and using usual ranitidine.

## 2018-01-12 ENCOUNTER — Telehealth: Payer: Self-pay | Admitting: Gastroenterology

## 2018-01-12 NOTE — Telephone Encounter (Signed)
Pt called to inform that metronidazole is giving her headaches. She feels that the top of her head hurts. Would like a call back.

## 2018-01-12 NOTE — Telephone Encounter (Addendum)
Pt called to report headache which she is associating with metronidazole Taking Pylera combination antibiotics and due to timing she feels headache is metronidazole related as opposed to tetracycline She is feel severe soreness on the top of her head, "almost like someone hit me on my head".  No trauma or falls. She describes the sensation that some is "leaking" or draining from the top of her head into her face (sinuses).  No rhinorrhea.  No fevers. Occurred last night after metronidazole dose, but not this morning.  She was late on the 2nd dose today and when taken symptoms recurred more severely than last night Mild nausea, no vomiting  I advised she discontinue flagyl for now. Office to check on her tomorrow To ER tonight if worsening pain, fever, visual changes, or other neurology symptoms Will alert Dr. Christella HartiganJacobs for further plan

## 2018-01-13 NOTE — Telephone Encounter (Signed)
Not sure if this is really from the flagyl. She's take about 1/2 the pylera course. She should complete the rest of the course wtihout the flagyl.  Needs rov with me in 2 months, will test stool ag around then to see if she needs retreatment with other abx.  thanks

## 2018-01-13 NOTE — Telephone Encounter (Signed)
The pt has been advised and will finish the rest of the course without flagyl and will follow up in August

## 2018-01-13 NOTE — Telephone Encounter (Signed)
How much longer does she have on the antibiotics total, I may want her to take the flagyl anyway (premidicate with tylenol for HA).

## 2018-01-13 NOTE — Telephone Encounter (Signed)
The pt stopped her flagyl and the headache has resolved.  She does have some sinus drainage now that she believes came form the flagyl  She stopped the flagyl last night.  Do you want her to continue other abx or send something else into the pharmacy.

## 2018-01-13 NOTE — Telephone Encounter (Signed)
She has about 7 days left.

## 2018-01-16 ENCOUNTER — Encounter: Payer: Self-pay | Admitting: Family Medicine

## 2018-01-16 ENCOUNTER — Ambulatory Visit (INDEPENDENT_AMBULATORY_CARE_PROVIDER_SITE_OTHER): Payer: Medicare HMO | Admitting: Family Medicine

## 2018-01-16 ENCOUNTER — Other Ambulatory Visit: Payer: Self-pay | Admitting: Internal Medicine

## 2018-01-16 VITALS — BP 118/78 | HR 68 | Temp 98.1°F | Wt 123.0 lb

## 2018-01-16 DIAGNOSIS — J3489 Other specified disorders of nose and nasal sinuses: Secondary | ICD-10-CM

## 2018-01-16 NOTE — Progress Notes (Signed)
Subjective:    Patient ID: Julie Duffy, female    DOB: 1946/01/18, 72 y.o.   MRN: [id number]  No chief complaint on file.   HPI Patient was seen today for acute concern.  Pt endorses headache, sinus drainage, burning/numbness at the top of her head x 2 days. Pt feels like her symptoms can be attributed to recent Flagyl use which made her dizzy..  Patient was on Flagyl, tetracycline, Protonix for H. pylori infection.  Pt d/c'd medications given her concerns.  Patient has tried Claritin-D which seems to help, but she still has the numbness and tingling/pain at the top of her head.  Tylenol does not help.   Pt states she does not like taking meds, and there are issues with abx overuse in medicine.  Pt also states she does not feel like she has H. Pylori.  States she "reads a lot" and there isn't really a cure for it/ "no one knows what causes it anyway". Past Medical History:  Diagnosis Date  . Allergy    seasonal  . Anxiety   . Constipation   . Hypertension   . Hypothyroid   . Scoliosis     Allergies  Allergen Reactions  . Guaifenesin & Derivatives Nausea And Vomiting  . Clindamycin/Lincomycin Cross Reactors Other (See Comments)    GI intolerance  . Codeine Other (See Comments)    hallucinations  . Sulfa Antibiotics Other (See Comments)    unspecified    ROS General: Denies fever, chills, night sweats, changes in weight, changes in appetite HEENT: Denies headaches, ear pain, changes in vision, rhinorrhea, sore throat  +nasal drianage, HA CV: Denies CP, palpitations, SOB, orthopnea Pulm: Denies SOB, cough, wheezing GI: Denies abdominal pain, nausea, vomiting, diarrhea, constipation GU: Denies dysuria, hematuria, frequency, vaginal discharge Msk: Denies muscle cramps, joint pains Neuro: Denies weakness  +numbness, tingling at top of head. Skin: Denies rashes, bruising Psych: Denies depression, anxiety, hallucinations     Objective:    Blood pressure 118/78, pulse 68,  temperature 98.1 F (36.7 C), temperature source Oral, weight 123 lb (55.8 kg), SpO2 98 %.   Gen. Pleasant, well-nourished, in no distress, normal affect   HEENT: [place]/AT, face symmetric, no scleral icterus, PERRLA, nares patent without drainage, pharynx without erythema or exudate. Lungs: no accessory muscle use, CTAB, no wheezes or rales Cardiovascular: RRR, no m/r/g, no peripheral edema Abdomen: BS present, soft, NT/ND Neuro:  A&Ox3, CN II-XII intact, normal gait   Wt Readings from Last 3 Encounters:  01/16/18 123 lb (55.8 kg)  12/30/17 126 lb (57.2 kg)  11/20/17 126 lb (57.2 kg)    Lab Results  Component Value Date   WBC 9.5 10/16/2017   HGB 13.2 10/16/2017   HCT 40.9 10/16/2017   PLT 291 10/16/2017   GLUCOSE 89 10/22/2017   CHOL 239 (H) 10/02/2010   TRIG 91.0 10/02/2010   HDL 68.00 10/02/2010   LDLDIRECT 153.0 10/02/2010   ALT 17 10/22/2017   AST 21 10/22/2017   NA 138 10/22/2017   K 4.4 10/22/2017   CL 100 10/22/2017   CREATININE 1.14 10/22/2017   BUN 11 10/22/2017   CO2 29 10/22/2017   TSH 0.98 04/21/2017    Assessment/Plan:  Nasal drainage  -Supportive care -Discussed using saline nasal spray for symptoms. -Follow-up with PCP as needed.  Abbe AmsterdamShannon Dawnette Mione, MD

## 2018-01-21 ENCOUNTER — Other Ambulatory Visit: Payer: Self-pay | Admitting: Internal Medicine

## 2018-01-21 ENCOUNTER — Telehealth: Payer: Self-pay | Admitting: Internal Medicine

## 2018-01-21 NOTE — Telephone Encounter (Signed)
Copied from CRM [id number]. Topic: Quick Communication - Rx Refill/Question >> Jan 21, 2018  3:49 PM Stephannie LiSimmons, Shantrice Rodenberg L, NT wrote: Medication:  ALPRAZolam [name](XANAX) 0.5 MG tablet   Has the patient contacted their pharmacy? yes  (Agent: If no, request that the patient contact the pharmacy for the refill. (Agent: If yes, when and what did the pharmacy advise? Pharmacy advised patient they did not receive request for refill  Preferred Pharmacy (with phone number or street name):[id number] - [place], [place] - [address] AT [address] [phone] (Phone) [phone] (Fax      Agent: Please be advised that RX refills may take up to 3 business days. We ask that you follow-up with your pharmacy.

## 2018-01-22 NOTE — Telephone Encounter (Signed)
Xanax refill. Pharmacy advised pt that request for medication refill was not received. Order class shows that prescription was printed on 01/20/18 Last Refill:01/20/18 #30  PCP: Dr. Amador CunasKwiatkowski [facility]  [address]

## 2018-01-23 NOTE — Telephone Encounter (Signed)
Left message to return phone call.

## 2018-01-23 NOTE — Telephone Encounter (Signed)
Patient states pharmacy has her script

## 2018-01-26 NOTE — Telephone Encounter (Signed)
Noted. Will close encounter. Nothing further needed.  

## 2018-03-11 ENCOUNTER — Ambulatory Visit (INDEPENDENT_AMBULATORY_CARE_PROVIDER_SITE_OTHER): Payer: Medicare HMO | Admitting: Internal Medicine

## 2018-03-11 ENCOUNTER — Encounter: Payer: Self-pay | Admitting: Internal Medicine

## 2018-03-11 VITALS — BP 128/68 | HR 50 | Temp 97.8°F | Wt 121.0 lb

## 2018-03-11 DIAGNOSIS — E039 Hypothyroidism, unspecified: Secondary | ICD-10-CM

## 2018-03-11 DIAGNOSIS — H6123 Impacted cerumen, bilateral: Secondary | ICD-10-CM

## 2018-03-11 DIAGNOSIS — I10 Essential (primary) hypertension: Secondary | ICD-10-CM | POA: Diagnosis not present

## 2018-03-11 MED ORDER — ALPRAZOLAM 0.5 MG PO TABS: 0.5000 mg | ORAL_TABLET | Freq: Two times a day (BID) | ORAL | 0 refills | Status: DC

## 2018-03-11 MED ORDER — AMLODIPINE BESYLATE 5 MG PO TABS: 5.0000 mg | ORAL_TABLET | Freq: Every day | ORAL | 3 refills | Status: DC

## 2018-03-11 NOTE — Progress Notes (Signed)
Subjective:    Patient ID: Julie Duffy, female    DOB: [birth year], 72 y.o.   MRN: [id number]  HPI  72 year old patient who is seen today with a chief complaint of diminished auditory acuity.  She has a history of anxiety disorder and hypothyroidism. She has been treated recently for H. Pylori.  Past Medical History:  Diagnosis Date  . Allergy    seasonal  . Anxiety   . Constipation   . Hypertension   . Hypothyroid   . Scoliosis      Social History   Socioeconomic History  . Marital status: Divorced    Spouse name: Not on file  . Number of children: 2  . Years of education: Not on file  . Highest education level: Not on file  Occupational History  . Occupation: Ambulance person: [employer]  Social Needs  . Financial resource strain: Not on file  . Food insecurity:    Worry: Not on file    Inability: Not on file  . Transportation needs:    Medical: Not on file    Non-medical: Not on file  Tobacco Use  . Smoking status: Never Smoker  . Smokeless tobacco: Never Used  Substance and Sexual Activity  . Alcohol use: No    Alcohol/week: 0.0 oz  . Drug use: No  . Sexual activity: Not on file  Lifestyle  . Physical activity:    Days per week: Not on file    Minutes per session: Not on file  . Stress: Not on file  Relationships  . Social connections:    Talks on phone: Not on file    Gets together: Not on file    Attends religious service: Not on file    Active member of club or organization: Not on file    Attends meetings of clubs or organizations: Not on file    Relationship status: Not on file  . Intimate partner violence:    Fear of current or ex partner: Not on file    Emotionally abused: Not on file    Physically abused: Not on file    Forced sexual activity: Not on file  Other Topics Concern  . Not on file  Social History Narrative  . Not on file    Past Surgical History:  Procedure Laterality Date  . APPENDECTOMY    . NASAL SINUS  SURGERY    . NECK LESION BIOPSY     cysts benign  . TUBAL LIGATION    . UMBILICAL HERNIA REPAIR      Family History  Problem Relation Age of Onset  . Cancer Mother 79       colon ca  . Heart disease Mother   . Colon cancer Mother   . Thyroid disease Mother   . Cancer Father   . Thyroid disease Daughter   . Esophageal cancer Neg Hx   . Stomach cancer Neg Hx     Allergies  Allergen Reactions  . Guaifenesin & Derivatives Nausea And Vomiting  . Clindamycin/Lincomycin Cross Reactors Other (See Comments)    GI intolerance  . Codeine Other (See Comments)    hallucinations  . Sulfa Antibiotics Other (See Comments)    unspecified    Current Outpatient Medications on File Prior to Visit  Medication Sig Dispense Refill  . aluminum-magnesium hydroxide-simethicone (MAALOX) 200-200-20 MG/5ML SUSP Take 30 mLs by mouth daily as needed (indigestion).    . AMBULATORY NON FORMULARY MEDICATION Medication  Name: Gi cocktail 5-10 ml every 4-6 hours as needed for pain 450 mL 0  . amLODipine (NORVASC) 5 MG tablet TAKE 1 TABLET BY MOUTH DAILY 90 tablet 3  . bismuth-metronidazole-tetracycline (PYLERA) 140-125-125 MG capsule Take 3 capsules by mouth 4 (four) times daily -  before meals and at bedtime. Take 3 capsules by mouth 4 times daily for 14 days 168 capsule 0  . calcium carbonate (TUMS - DOSED IN MG ELEMENTAL CALCIUM) 500 MG chewable tablet Chew 1 tablet by mouth daily as needed for indigestion or heartburn.    . levothyroxine (SYNTHROID, LEVOTHROID) 88 MCG tablet TAKE 1 TABLET BY MOUTH EVERY DAY 90 tablet 1  . ranitidine (ZANTAC) 150 MG tablet Take 1 tablet (150 mg total) by mouth 2 (two) times daily. 60 tablet 3  . bismuth subsalicylate (PEPTO BISMOL) 262 MG/15ML suspension Take 30 mLs by mouth every 6 (six) hours as needed for indigestion.    Marland Kitchen. omeprazole (PRILOSEC) 20 MG capsule Take 1 capsule (20 mg total) by mouth daily. (Patient not taking: Reported on 03/11/2018) 90 capsule 3  .  pantoprazole (PROTONIX) 40 MG tablet Take 1 tablet (40 mg total) by mouth 2 (two) times daily. (Patient not taking: Reported on 03/11/2018) 60 tablet 2   Current Facility-Administered Medications on File Prior to Visit  Medication Dose Route Frequency Provider Last Rate Last Dose  . 0.9 %  sodium chloride infusion  500 mL Intravenous Once Rachael FeeJacobs, Daniel P, MD        BP 128/68 (BP Location: Right Arm, Patient Position: Sitting, Cuff Size: Normal)   Pulse (!) 50   Temp 97.8 F (36.6 C) (Oral)   Wt 121 lb (54.9 kg)   SpO2 98%   BMI 20.77 kg/m     Review of Systems  Constitutional: Negative.   HENT: Positive for hearing loss. Negative for congestion, dental problem, rhinorrhea, sinus pressure, sore throat and tinnitus.   Eyes: Negative for pain, discharge and visual disturbance.  Respiratory: Negative for cough and shortness of breath.   Cardiovascular: Negative for chest pain, palpitations and leg swelling.  Gastrointestinal: Negative for abdominal distention, abdominal pain, blood in stool, constipation, diarrhea, nausea and vomiting.  Genitourinary: Negative for difficulty urinating, dysuria, flank pain, frequency, hematuria, pelvic pain, urgency, vaginal bleeding, vaginal discharge and vaginal pain.  Musculoskeletal: Negative for arthralgias, gait problem and joint swelling.  Skin: Negative for rash.  Neurological: Negative for dizziness, syncope, speech difficulty, weakness, numbness and headaches.  Hematological: Negative for adenopathy.  Psychiatric/Behavioral: Positive for sleep disturbance. Negative for agitation, behavioral problems and dysphoric mood. The patient is nervous/anxious.        Objective:   Physical Exam  Constitutional: She appears well-developed and well-nourished. No distress.  HENT:  Both canals occluded with cerumen          Assessment & Plan:  Bilateral cerumen impactions.  Both canals irrigated until clear  Hypothyroidism.  Levothyroxine  refilled H. pylori gastritis.  Continue PPI Hypothyroidism.  Levothyroxine refilled Essential hypertension stable  Patient has been asked to follow-up with a new provider in 4 to 6 months  Gordy SaversPeter F Kwiatkowski

## 2018-03-11 NOTE — Patient Instructions (Addendum)
Limit your sodium (Salt) intake  Please check your blood pressure on a regular basis.  If it is consistently greater than 150/90, please make an office appointment.  Return in 6 months for follow-up   Earwax Buildup, Adult The ears produce a substance called earwax that helps keep bacteria out of the ear and protects the skin in the ear canal. Occasionally, earwax can build up in the ear and cause discomfort or hearing loss. What increases the risk? This condition is more likely to develop in people who:  Are female.  Are elderly.  Naturally produce more earwax.  Clean their ears often with cotton swabs.  Use earplugs often.  Use in-ear headphones often.  Wear hearing aids.  Have narrow ear canals.  Have earwax that is overly thick or sticky.  Have eczema.  Are dehydrated.  Have excess hair in the ear canal.  What are the signs or symptoms? Symptoms of this condition include:  Reduced or muffled hearing.  A feeling of fullness in the ear or feeling that the ear is plugged.  Fluid coming from the ear.  Ear pain.  Ear itch.  Ringing in the ear.  Coughing.  An obvious piece of earwax that can be seen inside the ear canal.  How is this diagnosed? This condition may be diagnosed based on:  Your symptoms.  Your medical history.  An ear exam. During the exam, your health care provider will look into your ear with an instrument called an otoscope.  You may have tests, including a hearing test. How is this treated? This condition may be treated by:  Using ear drops to soften the earwax.  Having the earwax removed by a health care provider. The health care provider may: ? Flush the ear with water. ? Use an instrument that has a loop on the end (curette). ? Use a suction device.  Surgery to remove the wax buildup. This may be done in severe cases.  Follow these instructions at home:  Take over-the-counter and prescription medicines only as told by  your health care provider.  Do not put any objects, including cotton swabs, into your ear. You can clean the opening of your ear canal with a washcloth or facial tissue.  Follow instructions from your health care provider about cleaning your ears. Do not over-clean your ears.  Drink enough fluid to keep your urine clear or pale yellow. This will help to thin the earwax.  Keep all follow-up visits as told by your health care provider. If earwax builds up in your ears often or if you use hearing aids, consider seeing your health care provider for routine, preventive ear cleanings. Ask your health care provider how often you should schedule your cleanings.  If you have hearing aids, clean them according to instructions from the manufacturer and your health care provider. Contact a health care provider if:  You have ear pain.  You develop a fever.  You have blood, pus, or other fluid coming from your ear.  You have hearing loss.  You have ringing in your ears that does not go away.  Your symptoms do not improve with treatment.  You feel like the room is spinning (vertigo). Summary  Earwax can build up in the ear and cause discomfort or hearing loss.  The most common symptoms of this condition include reduced or muffled hearing and a feeling of fullness in the ear or feeling that the ear is plugged.  This condition may be diagnosed based  on your symptoms, your medical history, and an ear exam.  This condition may be treated by using ear drops to soften the earwax or by having the earwax removed by a health care provider.  Do not put any objects, including cotton swabs, into your ear. You can clean the opening of your ear canal with a washcloth or facial tissue. This information is not intended to replace advice given to you by your health care provider. Make sure you discuss any questions you have with your health care provider. Document Released: 08/29/2004 Document Revised:  10/02/2016 Document Reviewed: 10/02/2016 Elsevier Interactive Patient Education  [employer].  Return in 6 months for follow-up

## 2018-04-03 ENCOUNTER — Ambulatory Visit: Payer: Medicare HMO | Admitting: Gastroenterology

## 2018-04-09 ENCOUNTER — Ambulatory Visit (INDEPENDENT_AMBULATORY_CARE_PROVIDER_SITE_OTHER): Payer: Medicare HMO | Admitting: Internal Medicine

## 2018-04-09 ENCOUNTER — Encounter: Payer: Self-pay | Admitting: Internal Medicine

## 2018-04-09 VITALS — BP 120/70 | HR 55 | Temp 98.2°F | Wt 121.8 lb

## 2018-04-09 DIAGNOSIS — K2970 Gastritis, unspecified, without bleeding: Secondary | ICD-10-CM

## 2018-04-09 DIAGNOSIS — B9681 Helicobacter pylori [H. pylori] as the cause of diseases classified elsewhere: Secondary | ICD-10-CM

## 2018-04-09 DIAGNOSIS — I10 Essential (primary) hypertension: Secondary | ICD-10-CM

## 2018-04-09 MED ORDER — AMOXICILLIN 500 MG PO CAPS: 1000.0000 mg | ORAL_CAPSULE | Freq: Two times a day (BID) | ORAL | 0 refills | Status: DC

## 2018-04-09 MED ORDER — CLARITHROMYCIN 500 MG PO TABS: 500.0000 mg | ORAL_TABLET | Freq: Two times a day (BID) | ORAL | 0 refills | Status: DC

## 2018-04-09 MED ORDER — LEVOFLOXACIN 500 MG PO TABS: 500.0000 mg | ORAL_TABLET | Freq: Every day | ORAL | 0 refills | Status: DC

## 2018-04-09 NOTE — Progress Notes (Signed)
Subjective:    Patient ID: Julie Duffy, female    DOB: 05/06/1946, 72 y.o.   MRN: [id number]  HPI  72 year old patient who has been followed by GI with a history of H. pylori gastritis.  She was initially treated with Pylera which she only took for 5 days and discontinued due to poor tolerance.  She also only took PPI therapy for 5 days She presents today complaining of nausea and increasing epigastric pain.  She states the symptoms are somewhat different from earlier pain that was more her typical reflux symptoms  Past Medical History:  Diagnosis Date  . Allergy    seasonal  . Anxiety   . Constipation   . Hypertension   . Hypothyroid   . Scoliosis      Social History   Socioeconomic History  . Marital status: Divorced    Spouse name: Not on file  . Number of children: 2  . Years of education: Not on file  . Highest education level: Not on file  Occupational History  . Occupation: Ambulance person: [employer]  Social Needs  . Financial resource strain: Not on file  . Food insecurity:    Worry: Not on file    Inability: Not on file  . Transportation needs:    Medical: Not on file    Non-medical: Not on file  Tobacco Use  . Smoking status: Never Smoker  . Smokeless tobacco: Never Used  Substance and Sexual Activity  . Alcohol use: No    Alcohol/week: 0.0 standard drinks  . Drug use: No  . Sexual activity: Not on file  Lifestyle  . Physical activity:    Days per week: Not on file    Minutes per session: Not on file  . Stress: Not on file  Relationships  . Social connections:    Talks on phone: Not on file    Gets together: Not on file    Attends religious service: Not on file    Active member of club or organization: Not on file    Attends meetings of clubs or organizations: Not on file    Relationship status: Not on file  . Intimate partner violence:    Fear of current or ex partner: Not on file    Emotionally abused: Not on file   Physically abused: Not on file    Forced sexual activity: Not on file  Other Topics Concern  . Not on file  Social History Narrative  . Not on file    Past Surgical History:  Procedure Laterality Date  . APPENDECTOMY    . NASAL SINUS SURGERY    . NECK LESION BIOPSY     cysts benign  . TUBAL LIGATION    . UMBILICAL HERNIA REPAIR      Family History  Problem Relation Age of Onset  . Cancer Mother 5       colon ca  . Heart disease Mother   . Colon cancer Mother   . Thyroid disease Mother   . Cancer Father   . Thyroid disease Daughter   . Esophageal cancer Neg Hx   . Stomach cancer Neg Hx     Allergies  Allergen Reactions  . Guaifenesin & Derivatives Nausea And Vomiting  . Clindamycin   . Clindamycin/Lincomycin Cross Reactors Other (See Comments)    GI intolerance  . Codeine Other (See Comments)    hallucinations  . Sulfa Antibiotics Other (See Comments)  unspecified    Current Outpatient Medications on File Prior to Visit  Medication Sig Dispense Refill  . ALPRAZolam (XANAX) 0.5 MG tablet Take 1 tablet (0.5 mg total) by mouth 2 (two) times daily. 60 tablet 0  . levothyroxine (SYNTHROID, LEVOTHROID) 88 MCG tablet TAKE 1 TABLET BY MOUTH EVERY DAY 90 tablet 1  . amLODipine (NORVASC) 5 MG tablet Take 1 tablet (5 mg total) by mouth daily. (Patient not taking: Reported on 04/09/2018) 90 tablet 3  . pantoprazole (PROTONIX) 40 MG tablet Take 1 tablet (40 mg total) by mouth 2 (two) times daily. (Patient not taking: Reported on 04/09/2018) 60 tablet 2   Current Facility-Administered Medications on File Prior to Visit  Medication Dose Route Frequency Provider Last Rate Last Dose  . 0.9 %  sodium chloride infusion  500 mL Intravenous Once Rachael Fee, MD        BP 120/70 (BP Location: Right Arm, Patient Position: Sitting, Cuff Size: Normal)   Pulse (!) 55   Temp 98.2 F (36.8 C) (Oral)   Wt 121 lb 12.8 oz (55.2 kg)   SpO2 99%   BMI 20.91 kg/m     Review of  Systems  Constitutional: Negative.   HENT: Negative for congestion, dental problem, hearing loss, rhinorrhea, sinus pressure, sore throat and tinnitus.   Eyes: Negative for pain, discharge and visual disturbance.  Respiratory: Negative for cough and shortness of breath.   Cardiovascular: Negative for chest pain, palpitations and leg swelling.  Gastrointestinal: Positive for abdominal pain and nausea. Negative for abdominal distention, blood in stool, constipation, diarrhea and vomiting.  Genitourinary: Negative for difficulty urinating, dysuria, flank pain, frequency, hematuria, pelvic pain, urgency, vaginal bleeding, vaginal discharge and vaginal pain.  Musculoskeletal: Negative for arthralgias, gait problem and joint swelling.  Skin: Negative for rash.  Neurological: Negative for dizziness, syncope, speech difficulty, weakness, numbness and headaches.  Hematological: Negative for adenopathy.  Psychiatric/Behavioral: Negative for agitation, behavioral problems and dysphoric mood. The patient is not nervous/anxious.        Objective:   Physical Exam  Constitutional: She appears well-developed and well-nourished. No distress.  Cardiovascular: Normal rate and regular rhythm.  Pulmonary/Chest: Effort normal and breath sounds normal.  Abdominal: Soft. Bowel sounds are normal.  Very mild epigastric tenderness          Assessment & Plan:   History of H. pylori gastritis.  Intolerant of Pylera.  Will switch to triple therapy to include clarithromycin amoxicillin and PPI therapy.  Will treat for 14 days  Follow-up GI if unimproved or intolerant of this regimen  Gordy Savers

## 2018-04-09 NOTE — Patient Instructions (Signed)
Helicobacter Pylori Infection  Helicobacter pylori infection is an infection in the stomach that is caused by the Helicobacter pylori (H. pylori) bacteria. This type of bacteria often lives in the lining of the stomach. The infection can cause ulcers and irritation (gastritis) in some people. It is the most common cause of ulcers in the stomach (gastric ulcer) and in the upper part of the intestine (duodenal ulcer). Having this infection may also increase the risk of stomach cancer and a type of white blood cell cancer (lymphoma) that affects the stomach.  What are the causes?  H. pylori is a type of bacteria that is often found in the stomachs of healthy people. The bacteria may be passed from person to person through contact with stool or saliva. It is not known why some people develop ulcers, gastritis, or cancer from the infection.  What increases the risk?  This condition is more likely to develop in people who:  · Have family members with the infection.  · Live with many other people, such as in a dormitory.  · Are of African, Hispanic, or Asian descent.     What are the signs or symptoms?  Most people with this infection do not have symptoms. If you do have symptoms, they may include:  · Heartburn.  · Stomach pain.  · Nausea.  · Vomiting.  · Blood-tinged vomit.  · Loss of appetite.  · Bad breath.     How is this diagnosed?  This condition may be diagnosed based on your symptoms, a physical exam, and various tests. Tests may include:  · Blood tests or stool tests to check for the proteins (antibodies) that your body may produce in response to the bacteria. These tests are the best way to confirm the diagnosis.  · A breath test to check for the type of gas that the H. pylori bacteria release after breaking down a substance called urea. For the test, you are asked to drink urea. This test is often done after treatment in order to find out if the treatment worked.  · A procedure in which a thin, flexible tube  with a tiny camera at the end is placed into your stomach and upper intestine (upper endoscopy). Your health care provider may also take tissue samples (biopsy) to test for H. pylori and cancer.     How is this treated?  Treatment for this condition usually involves taking a combination of medicines (triple therapy) for a couple of weeks. Triple therapy includes one medicine to reduce the acid in your stomach and two types of antibiotic medicines. Many drug combinations have been approved for treatment. Treatment usually kills the H. pylori and reduces your risk of cancer. You may need to be tested for H. pylori again after treatment. In some cases, the treatment may need to be repeated.  Follow these instructions at home:  ·   · Take over-the-counter and prescription medicines only as told by your health care provider.  · Take your antibiotics as told by your health care provider. Do not stop taking the antibiotics even if you start to feel better.  · You can do all your usual activities and eat what you usually do.  · Take steps to prevent future infections:  ? Wash your hands often.  ? Make sure the food you eat has been properly prepared.  ? Drink water only from clean sources.  · Keep all follow-up visits as told by your health care provider. This is important.  Contact   a health care provider if:  · Your symptoms do not get better.  · Your symptoms return after treatment.  This information is not intended to replace advice given to you by your health care provider. Make sure you discuss any questions you have with your health care provider.  Document Released: 11/13/2015 Document Revised: 12/28/2015 Document Reviewed: 08/03/2014  Elsevier Interactive Patient Education © 2018 Elsevier Inc.   

## 2018-05-07 ENCOUNTER — Encounter (HOSPITAL_COMMUNITY): Payer: Self-pay | Admitting: *Deleted

## 2018-05-07 ENCOUNTER — Other Ambulatory Visit: Payer: Self-pay

## 2018-05-07 DIAGNOSIS — I10 Essential (primary) hypertension: Secondary | ICD-10-CM | POA: Insufficient documentation

## 2018-05-07 DIAGNOSIS — E039 Hypothyroidism, unspecified: Secondary | ICD-10-CM | POA: Insufficient documentation

## 2018-05-07 DIAGNOSIS — R1013 Epigastric pain: Secondary | ICD-10-CM | POA: Diagnosis not present

## 2018-05-07 DIAGNOSIS — R110 Nausea: Secondary | ICD-10-CM | POA: Diagnosis not present

## 2018-05-07 DIAGNOSIS — R140 Abdominal distension (gaseous): Secondary | ICD-10-CM | POA: Diagnosis not present

## 2018-05-07 DIAGNOSIS — Z79899 Other long term (current) drug therapy: Secondary | ICD-10-CM | POA: Insufficient documentation

## 2018-05-07 DIAGNOSIS — Z8619 Personal history of other infectious and parasitic diseases: Secondary | ICD-10-CM | POA: Insufficient documentation

## 2018-05-07 DIAGNOSIS — R109 Unspecified abdominal pain: Secondary | ICD-10-CM | POA: Diagnosis not present

## 2018-05-07 DIAGNOSIS — R1110 Vomiting, unspecified: Secondary | ICD-10-CM | POA: Diagnosis not present

## 2018-05-07 LAB — CBC
HCT: 39.2 % (ref 36.0–46.0)
Hemoglobin: 12.9 g/dL (ref 12.0–15.0)
MCH: 30.2 pg (ref 26.0–34.0)
MCHC: 32.9 g/dL (ref 30.0–36.0)
MCV: 91.8 fL (ref 78.0–100.0)
PLATELETS: 265 10*3/uL (ref 150–400)
RBC: 4.27 MIL/uL (ref 3.87–5.11)
RDW: 13.4 % (ref 11.5–15.5)
WBC: 7.3 10*3/uL (ref 4.0–10.5)

## 2018-05-07 LAB — URINALYSIS, ROUTINE W REFLEX MICROSCOPIC
BILIRUBIN URINE: NEGATIVE
Bacteria, UA: NONE SEEN
GLUCOSE, UA: NEGATIVE mg/dL
KETONES UR: NEGATIVE mg/dL
Nitrite: NEGATIVE
PROTEIN: NEGATIVE mg/dL
Specific Gravity, Urine: 1.002 — ABNORMAL LOW (ref 1.005–1.030)
pH: 7 (ref 5.0–8.0)

## 2018-05-07 NOTE — ED Triage Notes (Signed)
Pt reports upper abdominal pain, bloating, nausea for two days.  Hx of h pylori and IBS.

## 2018-05-08 ENCOUNTER — Emergency Department (HOSPITAL_COMMUNITY): Payer: Medicare HMO

## 2018-05-08 ENCOUNTER — Emergency Department (HOSPITAL_COMMUNITY)
Admission: EM | Admit: 2018-05-08 | Discharge: 2018-05-08 | Disposition: A | Discharge status: Home / Self Care | Payer: Medicare HMO | Attending: Emergency Medicine | Admitting: Emergency Medicine

## 2018-05-08 ENCOUNTER — Encounter (HOSPITAL_COMMUNITY): Payer: Self-pay

## 2018-05-08 DIAGNOSIS — R109 Unspecified abdominal pain: Secondary | ICD-10-CM | POA: Diagnosis not present

## 2018-05-08 DIAGNOSIS — Z8619 Personal history of other infectious and parasitic diseases: Secondary | ICD-10-CM

## 2018-05-08 DIAGNOSIS — R1013 Epigastric pain: Secondary | ICD-10-CM

## 2018-05-08 DIAGNOSIS — R1110 Vomiting, unspecified: Secondary | ICD-10-CM | POA: Diagnosis not present

## 2018-05-08 DIAGNOSIS — R140 Abdominal distension (gaseous): Secondary | ICD-10-CM | POA: Diagnosis not present

## 2018-05-08 LAB — COMPREHENSIVE METABOLIC PANEL
ALT: 20 U/L (ref 0–44)
AST: 23 U/L (ref 15–41)
Albumin: 4.1 g/dL (ref 3.5–5.0)
Alkaline Phosphatase: 66 U/L (ref 38–126)
Anion gap: 9 (ref 5–15)
BUN: 11 mg/dL (ref 8–23)
CHLORIDE: 102 mmol/L (ref 98–111)
CO2: 30 mmol/L (ref 22–32)
CREATININE: 1.01 mg/dL — AB (ref 0.44–1.00)
Calcium: 9.8 mg/dL (ref 8.9–10.3)
GFR calc Af Amer: >60 mL/min (ref >60)
GFR calc non Af Amer: 54 mL/min — ABNORMAL LOW (ref >60)
Glucose, Bld: 100 mg/dL — ABNORMAL HIGH (ref 70–99)
Potassium: 4.7 mmol/L (ref 3.5–5.1)
SODIUM: 141 mmol/L (ref 135–145)
Total Bilirubin: 0.9 mg/dL (ref 0.3–1.2)
Total Protein: 7.2 g/dL (ref 6.5–8.1)

## 2018-05-08 LAB — LIPASE, BLOOD: Lipase: 63 U/L — ABNORMAL HIGH (ref 11–51)

## 2018-05-08 MED ORDER — IOPAMIDOL (ISOVUE-300) INJECTION 61%
INTRAVENOUS | Status: AC
  Filled 2018-05-08: qty 100

## 2018-05-08 MED ORDER — OMEPRAZOLE 20 MG PO CPDR: 20.0000 mg | DELAYED_RELEASE_CAPSULE | Freq: Two times a day (BID) | ORAL | 0 refills | Status: DC

## 2018-05-08 MED ORDER — SODIUM CHLORIDE 0.9 % IJ SOLN
INTRAMUSCULAR | Status: AC
  Filled 2018-05-08: qty 50

## 2018-05-08 MED ORDER — SODIUM CHLORIDE 0.9 % IV BOLUS
1000.0000 mL | Freq: Once | INTRAVENOUS | Status: AC
  Administered 2018-05-08: 1000 mL via INTRAVENOUS

## 2018-05-08 MED ORDER — IOPAMIDOL (ISOVUE-300) INJECTION 61%
100.0000 mL | Freq: Once | INTRAVENOUS | Status: AC | PRN
  Administered 2018-05-08: 100 mL via INTRAVENOUS

## 2018-05-08 MED ORDER — ONDANSETRON HCL 4 MG/2ML IJ SOLN
4.0000 mg | Freq: Once | INTRAMUSCULAR | Status: AC
  Administered 2018-05-08: 4 mg via INTRAVENOUS
  Filled 2018-05-08: qty 2

## 2018-05-08 MED ORDER — ONDANSETRON 4 MG PO TBDP
4.0000 mg | ORAL_TABLET | Freq: Once | ORAL | Status: AC
  Administered 2018-05-08: 4 mg via ORAL
  Filled 2018-05-08: qty 1

## 2018-05-08 MED ORDER — GI COCKTAIL ~~LOC~~
30.0000 mL | Freq: Once | ORAL | Status: AC
  Administered 2018-05-08: 30 mL via ORAL
  Filled 2018-05-08: qty 30

## 2018-05-08 MED ORDER — ONDANSETRON 8 MG PO TBDP: ORAL_TABLET | ORAL | 0 refills | Status: DC

## 2018-05-08 NOTE — ED Provider Notes (Signed)
[facility]-EMERGENCY DEPT Provider Note   CSN: [id number] Arrival date & time: 05/07/18  2230     History   Chief Complaint Chief Complaint  Patient presents with  . Abdominal Pain    HPI Julie Duffy is a 72 y.o. female.  Patient is a 72 year old female with past medical history of hypertension, hypothyroidism, and gastric ulcer.  She had an endoscopy performed 6 months ago which tested positive for H. pylori.  She was treated with triple therapy, however only completed 5 days due to increased stomach upset.  She has had worsening discomfort over the past 3 days.  She is having pain in the epigastric region with no fevers or chills.  She is denying any vomiting or diarrhea.  The history is provided by the patient.  Abdominal Pain   This is a recurrent problem. The current episode started 2 days ago. The problem occurs constantly. The problem has been gradually worsening. The pain is associated with an unknown factor. The pain is located in the epigastric region. The quality of the pain is cramping. The pain is moderate. Associated symptoms include nausea. Pertinent negatives include fever, belching, hematochezia, vomiting, constipation, dysuria and frequency. The symptoms are aggravated by certain positions and palpation. Nothing relieves the symptoms.    Past Medical History:  Diagnosis Date  . Allergy    seasonal  . Anxiety   . Constipation   . Hypertension   . Hypothyroid   . Scoliosis     Patient Active Problem List   Diagnosis Date Noted  . Osteoarthritis of spine with radiculopathy, cervical region 08/16/2016  . Essential hypertension 09/28/2015  . Paresthesia and pain of extremity 09/02/2011  . VITILIGO 01/25/2008  . Hypothyroidism 07/16/2007    Past Surgical History:  Procedure Laterality Date  . APPENDECTOMY    . NASAL SINUS SURGERY    . NECK LESION BIOPSY     cysts benign  . TUBAL LIGATION    . UMBILICAL HERNIA REPAIR       OB  History   None      Home Medications    Prior to Admission medications   Medication Sig Start Date End Date Taking? Authorizing Provider  ALPRAZolam [name]) 0.5 MG tablet Take 1 tablet (0.5 mg total) by mouth 2 (two) times daily. 03/11/18  Yes Gordy Savers, MD  amLODipine (NORVASC) 5 MG tablet Take 1 tablet (5 mg total) by mouth daily. 03/11/18  Yes Gordy Savers, MD  levothyroxine (SYNTHROID, LEVOTHROID) 88 MCG tablet TAKE 1 TABLET BY MOUTH EVERY DAY 11/04/17  Yes Gordy Savers, MD  amoxicillin (AMOXIL) 500 MG capsule Take 2 capsules (1,000 mg total) by mouth 2 (two) times daily. Patient not taking: Reported on 05/08/2018 04/09/18   Gordy Savers, MD  clarithromycin (BIAXIN) 500 MG tablet Take 1 tablet (500 mg total) by mouth 2 (two) times daily. Patient not taking: Reported on 05/08/2018 04/09/18   Gordy Savers, MD  pantoprazole (PROTONIX) 40 MG tablet Take 1 tablet (40 mg total) by mouth 2 (two) times daily. Patient not taking: Reported on 04/09/2018 11/03/17   Gordy Savers, MD    Family History Family History  Problem Relation Age of Onset  . Cancer Mother 66       colon ca  . Heart disease Mother   . Colon cancer Mother   . Thyroid disease Mother   . Cancer Father   . Thyroid disease Daughter   . Esophageal cancer  Neg Hx   . Stomach cancer Neg Hx     Social History Social History   Tobacco Use  . Smoking status: Never Smoker  . Smokeless tobacco: Never Used  Substance Use Topics  . Alcohol use: No    Alcohol/week: 0.0 standard drinks  . Drug use: No     Allergies   Guaifenesin & derivatives; Clindamycin; Clindamycin/lincomycin cross reactors; Codeine; and Sulfa antibiotics   Review of Systems Review of Systems  Constitutional: Negative for fever.  Gastrointestinal: Positive for abdominal pain and nausea. Negative for constipation, hematochezia and vomiting.  Genitourinary: Negative for dysuria and frequency.  All other systems  reviewed and are negative.    Physical Exam Updated Vital Signs BP (!) 141/91   Pulse (!) 54   Temp 98 F (36.7 C) (Oral)   Resp 16   Ht 5\' 4"  (1.626 m)   Wt 54.9 kg   SpO2 100%   BMI 20.77 kg/m   Physical Exam  Constitutional: She is oriented to person, place, and time. She appears well-developed and well-nourished. No distress.  HENT:  Head: Normocephalic and atraumatic.  Neck: Normal range of motion. Neck supple.  Cardiovascular: Normal rate and regular rhythm. Exam reveals no gallop and no friction rub.  No murmur heard. Pulmonary/Chest: Effort normal and breath sounds normal. No respiratory distress. She has no wheezes.  Abdominal: Soft. Bowel sounds are normal. She exhibits no distension. There is tenderness in the epigastric area. There is no rigidity, no rebound and no guarding.  Musculoskeletal: Normal range of motion.  Neurological: She is alert and oriented to person, place, and time.  Skin: Skin is warm and dry. She is not diaphoretic.  Nursing note and vitals reviewed.    ED Treatments / Results  Labs (all labs ordered are listed, but only abnormal results are displayed) Labs Reviewed  LIPASE, BLOOD - Abnormal; Notable for the following components:      Result Value   Lipase 63 (*)    All other components within normal limits  COMPREHENSIVE METABOLIC PANEL - Abnormal; Notable for the following components:   Glucose, Bld 100 (*)    Creatinine, Ser 1.01 (*)    GFR calc non Af Amer 54 (*)    All other components within normal limits  URINALYSIS, ROUTINE W REFLEX MICROSCOPIC - Abnormal; Notable for the following components:   Color, Urine COLORLESS (*)    Specific Gravity, Urine 1.002 (*)    Hgb urine dipstick SMALL (*)    Leukocytes, UA TRACE (*)    All other components within normal limits  CBC    EKG None  Radiology No results found.  Procedures Procedures (including critical care time)  Medications Ordered in ED Medications  gi cocktail  (Maalox,Lidocaine,Donnatal) (has no administration in time range)  sodium chloride 0.9 % bolus 1,000 mL (has no administration in time range)  ondansetron (ZOFRAN) injection 4 mg (has no administration in time range)  ondansetron (ZOFRAN-ODT) disintegrating tablet 4 mg (4 mg Oral Given 05/08/18 0315)     Initial Impression / Assessment and Plan / ED Course  I have reviewed the triage vital signs and the nursing notes.  Pertinent labs & imaging results that were available during my care of the patient were reviewed by me and considered in my medical decision making (see chart for details).  Patient presents with epigastric pain.  She has a history of GERD and H. pylori.  Her laboratory studies are reassuring and CT scan shows no acute  intra-abdominal process.  At this point, I see no indication for further work-up.  She is feeling better after a GI cocktail and will be discharged with Prilosec and Zofran.  Final Clinical Impressions(s) / ED Diagnoses   Final diagnoses:  None    ED Discharge Orders    None       Geoffery Lyons, MD 05/08/18 [phone]

## 2018-05-08 NOTE — Discharge Instructions (Signed)
Prilosec and Zofran as prescribed.  Follow-up with your primary doctor and gastroenterologist if symptoms are not improving in the next week.

## 2018-05-27 ENCOUNTER — Other Ambulatory Visit: Payer: Self-pay

## 2018-05-27 ENCOUNTER — Ambulatory Visit (INDEPENDENT_AMBULATORY_CARE_PROVIDER_SITE_OTHER): Payer: Medicare HMO | Admitting: Family Medicine

## 2018-05-27 ENCOUNTER — Encounter: Payer: Self-pay | Admitting: Family Medicine

## 2018-05-27 VITALS — BP 120/74 | HR 54 | Temp 97.6°F | Wt 122.4 lb

## 2018-05-27 DIAGNOSIS — R1013 Epigastric pain: Secondary | ICD-10-CM

## 2018-05-27 DIAGNOSIS — R109 Unspecified abdominal pain: Secondary | ICD-10-CM | POA: Diagnosis not present

## 2018-05-27 DIAGNOSIS — K5900 Constipation, unspecified: Secondary | ICD-10-CM | POA: Diagnosis not present

## 2018-05-27 NOTE — Progress Notes (Signed)
Subjective:     Patient ID: Julie Duffy, female   DOB: [birth year], 72 y.o.   MRN: [id number]  HPI Patient is seen with long-standing history of dyspepsia and GI symptoms.  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  She had EGD back in May with positive Helicobacter pylori.  She was put on initial antibiotic regimen but was unable to take secondary to side effects.  She was then treated by her primary with reported 14-day course of omeprazole, amoxicillin, and Biaxin and completed that recently.  She went to the ED with epigastric pain on 05/08/2018 had CT abdomen pelvis which was unremarkable for acute findings.  Lab work significant for minimally elevated lipase (63).  No history of reported pancreatitis.  On EGD back in May she had some gastritis type findings.  She has taken some omeprazole but not consistently.  She has recently taken some Maalox which seems to help slightly.  She has chronic constipation.  She has taken MiraLAX but is convinced that has causing some of her upper abdominal cramping.  She describes cramp-like pains upper abdomen.  The MiraLAX has been effective for her constipation but she seems to have more cramping afterwards.  She has not tried stool softeners.  denies any significant appetite or weight changes.  No hematemesis.  No melena.  No regular ETOH or NSAID use.  Past Medical History:  Diagnosis Date  . Allergy    seasonal  . Anxiety   . Constipation   . Hypertension   . Hypothyroid   . Scoliosis    Past Surgical History:  Procedure Laterality Date  . APPENDECTOMY    . NASAL SINUS SURGERY    . NECK LESION BIOPSY     cysts benign  . TUBAL LIGATION    . UMBILICAL HERNIA REPAIR      reports that she has never smoked. She has never used smokeless tobacco. She reports that she does not drink alcohol or use drugs. family history includes Cancer in her father; Cancer (age of onset: 83) in her mother; Colon cancer in her mother; Heart disease in  her mother; Thyroid disease in her daughter and mother. Allergies  Allergen Reactions  . Guaifenesin & Derivatives Nausea And Vomiting  . Clindamycin Other (See Comments)    GI intolerance  . Clindamycin/Lincomycin Cross Reactors Other (See Comments)    GI intolerance  . Codeine Other (See Comments)    hallucinations  . Sulfa Antibiotics Other (See Comments)    unspecified     Review of Systems  Constitutional: Negative for appetite change, chills and fever.  HENT: Negative for trouble swallowing.   Respiratory: Negative for shortness of breath.   Cardiovascular: Negative for chest pain.  Gastrointestinal: Positive for abdominal pain and constipation. Negative for abdominal distention, blood in stool, diarrhea, nausea and vomiting.       Objective:   Physical Exam  Constitutional: She appears well-developed and well-nourished.  Neck: Neck supple. No thyromegaly present.  Cardiovascular: Normal rate and regular rhythm.  Pulmonary/Chest: Effort normal and breath sounds normal.  Abdominal: Soft. Bowel sounds are normal. She exhibits no mass. There is no tenderness. There is no rebound and no guarding.  Musculoskeletal: She exhibits no edema.  Lymphadenopathy:    She has no cervical adenopathy.       Assessment:     Patient has long-standing history of dyspepsia with EGD in May showing some diffuse gastritis type changes and positive Helicobacter pylori which was treated with recent regimen of antibiotics  and PPI.  Presents today with some intermittent cramp-like pains upper abdomen with some chronic constipation issues.  Recent CT scan no acute findings    Plan:     -She is encouraged to keep follow-up with GI for follow-up post-treatment H. pylori testing -Plenty of fluids and measures discussed to reduce constipation -leave off the Miralax for now. -She will try over-the-counter stool softener such as Colace 1-2 daily  Kristian Covey MD [facility]  [facility]

## 2018-05-27 NOTE — Patient Instructions (Addendum)
Constipation, Adult Constipation is when a person has fewer bowel movements in a week than normal, has difficulty having a bowel movement, or has stools that are dry, hard, or larger than normal. Constipation may be caused by an underlying condition. It may become worse with age if a person takes certain medicines and does not take in enough fluids. Follow these instructions at home: Eating and drinking   Eat foods that have a lot of fiber, such as fresh fruits and vegetables, whole grains, and beans.  Limit foods that are high in fat, low in fiber, or overly processed, such as french fries, hamburgers, cookies, candies, and soda.  Drink enough fluid to keep your urine clear or pale yellow. General instructions  Exercise regularly or as told by your health care provider.  Go to the restroom when you have the urge to go. Do not hold it in.  Take over-the-counter and prescription medicines only as told by your health care provider. These include any fiber supplements.  Practice pelvic floor retraining exercises, such as deep breathing while relaxing the lower abdomen and pelvic floor relaxation during bowel movements.  Watch your condition for any changes.  Keep all follow-up visits as told by your health care provider. This is important. Contact a health care provider if:  You have pain that gets worse.  You have a fever.  You do not have a bowel movement after 4 days.  You vomit.  You are not hungry.  You lose weight.  You are bleeding from the anus.  You have thin, pencil-like stools. Get help right away if:  You have a fever and your symptoms suddenly get worse.  You leak stool or have blood in your stool.  Your abdomen is bloated.  You have severe pain in your abdomen.  You feel dizzy or you faint. This information is not intended to replace advice given to you by your health care provider. Make sure you discuss any questions you have with your health care  provider. Document Released: 04/19/2004 Document Revised: 02/09/2016 Document Reviewed: 01/10/2016 Elsevier Interactive Patient Education  2018 [employer].  Consider trial of OTC stool softener such as Colace - one to two daily.  Follow up with GI for repeat H pylori testing.

## 2018-05-28 ENCOUNTER — Telehealth: Payer: Self-pay | Admitting: Physician Assistant

## 2018-05-28 NOTE — Telephone Encounter (Signed)
The pt states she finally completed the 14 day treatment for H pylori but continues to have "soreness" in the abd, nausea and constipation.  She would like to come in and see Victorino Dike to discuss.  Appt made for 10/31.

## 2018-05-30 ENCOUNTER — Other Ambulatory Visit: Payer: Self-pay | Admitting: Internal Medicine

## 2018-06-02 NOTE — Telephone Encounter (Signed)
Pt is wanting Alprazolam refilled until  Appt. Okay for refill?

## 2018-06-04 ENCOUNTER — Ambulatory Visit: Payer: Medicare HMO | Admitting: Physician Assistant

## 2018-06-04 ENCOUNTER — Encounter: Payer: Self-pay | Admitting: Physician Assistant

## 2018-06-04 VITALS — BP 136/70 | HR 52 | Ht 64.0 in | Wt 119.2 lb

## 2018-06-04 DIAGNOSIS — R1013 Epigastric pain: Secondary | ICD-10-CM

## 2018-06-04 DIAGNOSIS — Z8619 Personal history of other infectious and parasitic diseases: Secondary | ICD-10-CM | POA: Diagnosis not present

## 2018-06-04 DIAGNOSIS — R110 Nausea: Secondary | ICD-10-CM

## 2018-06-04 DIAGNOSIS — R634 Abnormal weight loss: Secondary | ICD-10-CM

## 2018-06-04 DIAGNOSIS — K5900 Constipation, unspecified: Secondary | ICD-10-CM

## 2018-06-04 MED ORDER — FAMOTIDINE 20 MG PO TABS: 20.0000 mg | ORAL_TABLET | Freq: Two times a day (BID) | ORAL | 5 refills | Status: DC

## 2018-06-04 MED ORDER — ONDANSETRON HCL 4 MG PO TABS: 4.0000 mg | ORAL_TABLET | Freq: Four times a day (QID) | ORAL | 2 refills | Status: DC | PRN

## 2018-06-04 NOTE — Progress Notes (Signed)
Chief Complaint: Follow-up H. pylori  HPI:    Julie Duffy is a 72 year old female with a past medical history as listed below, follows with Dr. Christella Hartigan, and presents to clinic today for follow-up of her H. pylori as well as no appetite, nausea and weight loss.    11/20/2017 patient seen in clinic and discussed reflux and nausea.  At that time, discussed that she had had a reaction of "bees in my head and I was burning up and my speech was garbled" after using Pantoprazole 40 mg twice daily so she stopped her PPIs.  At that time an EGD was recommended.  She was prescribed Omeprazole 20 mg twice daily as well as Zantac 120 mg twice daily and GI cocktail.    12/30/2017 EGD with gastritis.  Biopsies were positive for H. pylori.  She was called in a 14-day course of Pylera.  Later changed to triple therapy with Amoxicillin, Clindamycin and Omeprazole.    Today, the patient tells me that she only took 5 days of her Pylera when initially prescribed because "I ran a headache and thought maybe this was related".  She continued with epigastric pain and nausea and went to see her primary care provider about a month ago who urged her to take her antibiotics.  Patient tells me she completed a 14-day course of triple therapy 2 weeks ago this Saturday and actually felt good for a couple of days, but then started again with some nausea and a feeling like she was "kicked in the stomach".  She has had a decreased appetite and lost around 15 pounds since April of this year.  She restarted Omeprazole 20 mg daily about 3 days ago but this has not seemed to help.    Continues with constipation.  Tells me she started taking MiraLAX daily but this gave her severe abdominal bloating and discomfort as well as some liquid stools.    Denies fever, chills, vomiting, blood in her stool or symptoms that awaken her from sleep.     Past Medical History:  Diagnosis Date  . Allergy    seasonal  . Anxiety   . Constipation   .  Hypertension   . Hypothyroid   . Scoliosis     Past Surgical History:  Procedure Laterality Date  . APPENDECTOMY    . NASAL SINUS SURGERY    . NECK LESION BIOPSY     cysts benign  . TUBAL LIGATION    . UMBILICAL HERNIA REPAIR      Current Outpatient Medications  Medication Sig Dispense Refill  . ALPRAZolam (XANAX) 0.5 MG tablet Take 1 tablet (0.5 mg total) by mouth 2 (two) times daily. 60 tablet 0  . amLODipine (NORVASC) 5 MG tablet Take 1 tablet (5 mg total) by mouth daily. 90 tablet 3  . levothyroxine (SYNTHROID, LEVOTHROID) 88 MCG tablet TAKE 1 TABLET BY MOUTH EVERY DAY 90 tablet 1  . omeprazole (PRILOSEC) 20 MG capsule Take 1 capsule (20 mg total) by mouth 2 (two) times daily before a meal. 30 capsule 0  . ondansetron (ZOFRAN ODT) 8 MG disintegrating tablet 8mg  ODT q4 hours prn nausea 6 tablet 0   Current Facility-Administered Medications  Medication Dose Route Frequency Provider Last Rate Last Dose  . 0.9 %  sodium chloride infusion  500 mL Intravenous Once Rachael Fee, MD        Allergies as of 06/04/2018 - Review Complete 06/04/2018  Allergen Reaction Noted  . Guaifenesin & derivatives Nausea  And Vomiting 09/23/2012  . Clindamycin Other (See Comments) 02/27/2015  . Clindamycin/lincomycin cross reactors Other (See Comments) 10/08/2010  . Codeine Other (See Comments)   . Sulfa antibiotics Other (See Comments)     Family History  Problem Relation Age of Onset  . Cancer Mother 84       colon ca  . Heart disease Mother   . Colon cancer Mother   . Thyroid disease Mother   . Cancer Father   . Thyroid disease Daughter   . Esophageal cancer Neg Hx   . Stomach cancer Neg Hx     Social History   Socioeconomic History  . Marital status: Divorced    Spouse name: Not on file  . Number of children: 2  . Years of education: Not on file  . Highest education level: Not on file  Occupational History  . Occupation: Ambulance person: [employer]  Social  Needs  . Financial resource strain: Not on file  . Food insecurity:    Worry: Not on file    Inability: Not on file  . Transportation needs:    Medical: Not on file    Non-medical: Not on file  Tobacco Use  . Smoking status: Never Smoker  . Smokeless tobacco: Never Used  Substance and Sexual Activity  . Alcohol use: No    Alcohol/week: 0.0 standard drinks  . Drug use: No  . Sexual activity: Not on file  Lifestyle  . Physical activity:    Days per week: Not on file    Minutes per session: Not on file  . Stress: Not on file  Relationships  . Social connections:    Talks on phone: Not on file    Gets together: Not on file    Attends religious service: Not on file    Active member of club or organization: Not on file    Attends meetings of clubs or organizations: Not on file    Relationship status: Not on file  . Intimate partner violence:    Fear of current or ex partner: Not on file    Emotionally abused: Not on file    Physically abused: Not on file    Forced sexual activity: Not on file  Other Topics Concern  . Not on file  Social History Narrative  . Not on file    Review of Systems:    Constitutional: No fever or chills  Cardiovascular: No chest pain   Respiratory: No SOB  Gastrointestinal: See HPI and otherwise negative   Physical Exam:  Vital signs: BP 136/70   Pulse (!) 52   Ht 5\' 4"  (1.626 m)   Wt 119 lb 4 oz (54.1 kg)   BMI 20.47 kg/m   Constitutional:   Pleasant Caucasian female appears to be in NAD, Well developed, Well nourished, alert and cooperative Respiratory: Respirations even and unlabored. Lungs clear to auscultation bilaterally.   No wheezes, crackles, or rhonchi.  Cardiovascular: Normal S1, S2. No MRG. Regular rate and rhythm. No peripheral edema, cyanosis or pallor.  Gastrointestinal:  Soft, nondistended, mild epigastric ttp. No rebound or guarding. Normal bowel sounds. No appreciable masses or hepatomegaly. Psychiatric: Demonstrates good  judgement and reason without abnormal affect or behaviors.  Most recent LABS AND IMAGING: CBC    Component Value Date/Time   WBC 7.3 05/07/2018 2320   RBC 4.27 05/07/2018 2320   HGB 12.9 05/07/2018 2320   HCT 39.2 05/07/2018 2320   PLT 265 05/07/2018  2320   MCV 91.8 05/07/2018 2320   MCH 30.2 05/07/2018 2320   MCHC 32.9 05/07/2018 2320   RDW 13.4 05/07/2018 2320   LYMPHSABS 2.1 04/21/2017 1614   MONOABS 0.4 04/21/2017 1614   EOSABS 0.3 04/21/2017 1614   BASOSABS 0.1 04/21/2017 1614    CMP     Component Value Date/Time   NA 141 05/07/2018 2320   K 4.7 05/07/2018 2320   CL 102 05/07/2018 2320   CO2 30 05/07/2018 2320   GLUCOSE 100 (H) 05/07/2018 2320   BUN 11 05/07/2018 2320   CREATININE 1.01 (H) 05/07/2018 2320   CALCIUM 9.8 05/07/2018 2320   PROT 7.2 05/07/2018 2320   ALBUMIN 4.1 05/07/2018 2320   AST 23 05/07/2018 2320   ALT 20 05/07/2018 2320   ALKPHOS 66 05/07/2018 2320   BILITOT 0.9 05/07/2018 2320   GFRNONAA 54 (L) 05/07/2018 2320   GFRAA >60 05/07/2018 2320    Assessment: 1.  History of H. pylori: Treated with triple therapy, 2 weeks ago 2.  Nausea: Over the past few weeks 3.  Weight loss: With abdominal pain and nausea 4.  Epigastric pain: Likely related to gastritis 5.  Constipation: Chronic for the patient, MiraLAX was "too strong"  Plan: 1.  Discussed with patient that I would like to ensure that she is free from H. pylori.  In order to do that she needs to discontinue her Omeprazole.  We can test via H. pylori fecal antigen the week of November 18 she will be at least 4 weeks out from antibiotic therapy and 2 weeks out from PPI.  Ordered this for the future. 2.  In the meantime patient can use Pepcid 20 mg twice daily, every morning and nightly, prescribed #60 with 3 refills 3.  Also prescribed Zofran 4 mg every 4-6 hours as needed for nausea #30 with 3 refills 4.  Reviewed antireflux diet and lifestyle modifications.  Provided patient with a  handout. 5.  Recommend the patient start a stool softener on an every day or every other day basis 6.  Discussed using Ensure or boost supplement shakes due to her weight loss 7.  Patient to follow in clinic per recommendations after H. pylori testing.  Hyacinth Meeker, PA-C [facility] 06/04/2018, 8:54 AM  Cc: Gordy Savers, MD

## 2018-06-04 NOTE — Patient Instructions (Addendum)
If you are age 73 or older, your body mass index should be between 23-30. Your Body mass index is 20.47 kg/m. If this is out of the aforementioned range listed, please consider follow up with your Primary Care Provider.  If you are age 51 or younger, your body mass index should be between 19-25. Your Body mass index is 20.47 kg/m. If this is out of the aformentioned range listed, please consider follow up with your Primary Care Provider.   Stop omeprazole.  Start Pepcid 20mg : Take twice a day  Start stool softener, Dulcolax once a day or once every other day.  Add Ensure or Boost shakes.  Start your probiotic.  Please go to the lab in the basement of our building for a H. Pylori stool test the week of November 18th. They are open between 7:30am and 5:00pm. You do not need an appointment.  Please follow up with Dr. Christella Hartigan on Wednesday,  Dec. 11th at 8:45 am.  Thank you for choosing [facility] and for entrusting me with your care, Hyacinth Meeker, PA-C

## 2018-06-04 NOTE — Progress Notes (Signed)
I agree with the above note, plan 

## 2018-06-08 NOTE — Telephone Encounter (Signed)
ok 

## 2018-06-22 ENCOUNTER — Other Ambulatory Visit: Payer: Medicare HMO

## 2018-06-22 ENCOUNTER — Ambulatory Visit: Payer: Medicare HMO | Admitting: Physician Assistant

## 2018-06-22 DIAGNOSIS — Z8619 Personal history of other infectious and parasitic diseases: Secondary | ICD-10-CM | POA: Diagnosis not present

## 2018-06-22 DIAGNOSIS — R1013 Epigastric pain: Secondary | ICD-10-CM | POA: Diagnosis not present

## 2018-06-22 DIAGNOSIS — K5900 Constipation, unspecified: Secondary | ICD-10-CM

## 2018-06-22 DIAGNOSIS — R110 Nausea: Secondary | ICD-10-CM

## 2018-06-23 LAB — HELICOBACTER PYLORI  SPECIAL ANTIGEN
MICRO NUMBER: 91386098
SPECIMEN QUALITY: ADEQUATE

## 2018-06-26 ENCOUNTER — Telehealth: Payer: Self-pay | Admitting: Physician Assistant

## 2018-06-26 NOTE — Telephone Encounter (Signed)
Victorino DikeJennifer did

## 2018-06-26 NOTE — Telephone Encounter (Signed)
Julie Duffy have you reviewed the H pylori lab?

## 2018-06-29 ENCOUNTER — Telehealth: Payer: Self-pay | Admitting: Gastroenterology

## 2018-06-29 NOTE — Telephone Encounter (Signed)
See alternate phone note  

## 2018-06-29 NOTE — Telephone Encounter (Signed)
Tried to reach pt, the voice mail was full will try later

## 2018-06-29 NOTE — Telephone Encounter (Signed)
Please let pt know H pylori negative. THanks-JLL

## 2018-06-29 NOTE — Telephone Encounter (Signed)
The pt has been advised and will call with any issues in the future.

## 2018-07-15 ENCOUNTER — Encounter: Payer: Self-pay | Admitting: Gastroenterology

## 2018-07-15 ENCOUNTER — Ambulatory Visit: Payer: Medicare HMO | Admitting: Gastroenterology

## 2018-07-15 VITALS — BP 120/70 | HR 74 | Ht 64.0 in | Wt 121.0 lb

## 2018-07-15 DIAGNOSIS — R110 Nausea: Secondary | ICD-10-CM | POA: Diagnosis not present

## 2018-07-15 DIAGNOSIS — Z8619 Personal history of other infectious and parasitic diseases: Secondary | ICD-10-CM | POA: Diagnosis not present

## 2018-07-15 DIAGNOSIS — K5900 Constipation, unspecified: Secondary | ICD-10-CM | POA: Diagnosis not present

## 2018-07-15 DIAGNOSIS — K219 Gastro-esophageal reflux disease without esophagitis: Secondary | ICD-10-CM

## 2018-07-15 MED ORDER — OMEPRAZOLE 40 MG PO CPDR: 40.0000 mg | DELAYED_RELEASE_CAPSULE | Freq: Every day | ORAL | 11 refills | Status: DC

## 2018-07-15 NOTE — Patient Instructions (Addendum)
Omeprazole [id number]$  pills, one pill before breakfast daily, disp 30 pills with 11 refills. Restart prune juice (DAILY).  Please return to see Dr. Christella HartiganJacobs in 6-7 weeks.  We have scheduled you for a follow up appointment with Dr Christella HartiganJacobs on 09/01/18 at 345pm  Thank you for entrusting me with your care and choosing [facility].  Dr Christella HartiganJacobs

## 2018-07-15 NOTE — Progress Notes (Signed)
Review of pertinent gastrointestinal problems: 1.  Abdominal pain, nausea.  Found to have H. Pylori + gastritis: 12/30/2017 EGD with gastritis.  Biopsies were positive for H. pylori.  She was called in a 14-day course of Pylera.  Later changed to triple therapy with Amoxicillin, Clindamycin and Omeprazole.  CT scan abdomen pelvis with IV contrast and oral contrast October 2019 showed moderate volume of stool, otherwise normal.  November 2019 H. pylori stool testing was negative. 2. FH of colon cancer (mother in her 3870s): Colonoscopy 05/2017 No polyps, recall recommended at 5 years.   HPI: This is a very pleasant 72 year old woman whom I last saw several months ago.  She was here in our office 1 or 2 months ago and saw [place].  Has intermittent days, periumbilical pains and nausea.    She had chronic constipation for years.  Uses prune juice after 3-4 days.  Has been taking miralax and dulcolax instead.  Takes miralax daily, one dose.   Chief complaint is abdominal pain, nausea  ROS: complete GI ROS as described in HPI, all other review negative.  Constitutional: Her weight is down about 5 pounds since this began, April 2019   Past Medical History:  Diagnosis Date  . Allergy    seasonal  . Anxiety   . Constipation   . H. pylori infection   . Hypertension   . Hypothyroid   . Scoliosis     Past Surgical History:  Procedure Laterality Date  . APPENDECTOMY    . NASAL SINUS SURGERY    . NECK LESION BIOPSY     cysts benign  . TUBAL LIGATION    . UMBILICAL HERNIA REPAIR      Current Outpatient Medications  Medication Sig Dispense Refill  . ALPRAZolam (XANAX) 0.5 MG tablet Take 1 tablet (0.5 mg total) by mouth 2 (two) times daily. 60 tablet 0  . Alum Hydroxide-Mag Carbonate (GAVISCON PO) Take by mouth as needed.    Marland Kitchen. amLODipine (NORVASC) 5 MG tablet Take 1 tablet (5 mg total) by mouth daily. (Patient taking differently: Take 5 mg by mouth daily as needed. ) 90 tablet 3  .  docusate sodium (STOOL SOFTENER) 100 MG capsule Take 100 mg by mouth daily.    Marland Kitchen. levothyroxine (SYNTHROID, LEVOTHROID) 88 MCG tablet TAKE 1 TABLET BY MOUTH EVERY DAY 90 tablet 1  . polyethylene glycol (MIRALAX / GLYCOLAX) packet Take 17 g by mouth daily.    . Probiotic Product (PROBIOTIC-10 PO) Take by mouth daily.     No current facility-administered medications for this visit.     Allergies as of 07/15/2018 - Review Complete 07/15/2018  Allergen Reaction Noted  . Guaifenesin & derivatives Nausea And Vomiting 09/23/2012  . Clindamycin Other (See Comments) 02/27/2015  . Clindamycin/lincomycin cross reactors Other (See Comments) 10/08/2010  . Codeine Other (See Comments)   . Sulfa antibiotics Other (See Comments)     Family History  Problem Relation Age of Onset  . Cancer Mother 4070       colon ca  . Heart disease Mother   . Colon cancer Mother   . Thyroid disease Mother   . Cancer Father   . Thyroid disease Daughter   . Esophageal cancer Neg Hx   . Stomach cancer Neg Hx     Social History   Socioeconomic History  . Marital status: Divorced    Spouse name: Not on file  . Number of children: 2  . Years of education: Not on file  .  Highest education level: Not on file  Occupational History  . Occupation: Ambulance person: [employer]  Social Needs  . Financial resource strain: Not on file  . Food insecurity:    Worry: Not on file    Inability: Not on file  . Transportation needs:    Medical: Not on file    Non-medical: Not on file  Tobacco Use  . Smoking status: Never Smoker  . Smokeless tobacco: Never Used  Substance and Sexual Activity  . Alcohol use: No    Alcohol/week: 0.0 standard drinks  . Drug use: No  . Sexual activity: Not on file  Lifestyle  . Physical activity:    Days per week: Not on file    Minutes per session: Not on file  . Stress: Not on file  Relationships  . Social connections:    Talks on phone: Not on file    Gets together: Not  on file    Attends religious service: Not on file    Active member of club or organization: Not on file    Attends meetings of clubs or organizations: Not on file    Relationship status: Not on file  . Intimate partner violence:    Fear of current or ex partner: Not on file    Emotionally abused: Not on file    Physically abused: Not on file    Forced sexual activity: Not on file  Other Topics Concern  . Not on file  Social History Narrative  . Not on file     Physical Exam: BP 120/70   Pulse 74   Ht 5\' 4"  (1.626 m)   Wt 121 lb (54.9 kg)   BMI 20.77 kg/m  Constitutional: generally well-appearing Psychiatric: alert and oriented x3 Abdomen: soft, nontender, nondistended, no obvious ascites, no peritoneal signs, normal bowel sounds No peripheral edema noted in lower extremities  Assessment and plan: 72 y.o. female with abdominal pain, nausea, chronic constipation  She is still bothered by intermittent abdominal pains and nausea.  She has chronic constipation that is worse since the past several months.  Possibly related to H. pylori.  The H. pylori has been eradicated based on stool testing.  I want to get her constipation under better control and so she is going to go back to her previous method of prune juice however she is going to take on a daily basis instead of as needed.  She tells me this lately has caused a lot of heartburn and so I am putting her on omeprazole 40 mg pills 1 pill once daily to allow her to take her prune juice and hopefully this will settle issues.  She will return to see me in 6 to 8 weeks.  If she is still very miserable with her overall GI symptoms she might need further testing.  Please see the "Patient Instructions" section for addition details about the plan.  Rob Bunting, MD [facility] 07/15/2018, 9:01 AM

## 2018-07-23 ENCOUNTER — Other Ambulatory Visit: Payer: Self-pay | Admitting: Internal Medicine

## 2018-07-23 ENCOUNTER — Ambulatory Visit (INDEPENDENT_AMBULATORY_CARE_PROVIDER_SITE_OTHER): Payer: Medicare HMO | Admitting: Internal Medicine

## 2018-07-23 VITALS — BP 132/68 | HR 57 | Temp 97.8°F | Ht 64.0 in | Wt 121.7 lb

## 2018-07-23 DIAGNOSIS — A048 Other specified bacterial intestinal infections: Secondary | ICD-10-CM

## 2018-07-23 DIAGNOSIS — E039 Hypothyroidism, unspecified: Secondary | ICD-10-CM

## 2018-07-23 DIAGNOSIS — I10 Essential (primary) hypertension: Secondary | ICD-10-CM | POA: Diagnosis not present

## 2018-07-23 DIAGNOSIS — K5900 Constipation, unspecified: Secondary | ICD-10-CM | POA: Diagnosis not present

## 2018-07-23 DIAGNOSIS — Z8719 Personal history of other diseases of the digestive system: Secondary | ICD-10-CM

## 2018-07-23 LAB — TSH: TSH: 24.73 u[IU]/mL — ABNORMAL HIGH (ref 0.35–4.50)

## 2018-07-23 MED ORDER — ALPRAZOLAM 0.5 MG PO TABS: 0.5000 mg | ORAL_TABLET | Freq: Two times a day (BID) | ORAL | 0 refills | Status: DC

## 2018-07-23 NOTE — Patient Instructions (Signed)
-  It was nice meeting you today!  -We will check your thyroid levels today. Will call you when results are available.  -Schedule follow up in 3-4 months for your annual wellness visit. Please come in fasting that day.

## 2018-07-23 NOTE — Progress Notes (Signed)
Established Patient Office Visit     CC/Reason for Visit: Establish care, follow-up chronic medical conditions, wants her thyroid level checked  HPI: Julie Duffy is a 72 y.o. female who is coming in today for the above mentioned reasons.  She is past due for her annual physical exam.  Past Medical History is significant for: Hypothyroidism on levothyroxine that has been well controlled, hypertension that has been well controlled on amlodipine 5 mg, over the past year she has had an EGD and tested positive for H. pylori, had difficulty taking triple therapy and continued to have abdominal issues.  Finally she completed therapy and, although improved, is still having issues with abdominal bloating, constipation.  She follows with Dr. Christella HartiganJacobs, GI.  Dr. Christella HartiganJacobs recommend that she take omeprazole but she has not filled this prescription yet.  She is also not taking daily MiraLAX.  Other than that she has no acute issues this year.  She had her flu vaccination earlier this year at [facility].     Past Medical/Surgical History: Past Medical History:  Diagnosis Date  . Allergy    seasonal  . Anxiety   . Constipation   . H. pylori infection   . Hypertension   . Hypothyroid   . Scoliosis     Past Surgical History:  Procedure Laterality Date  . APPENDECTOMY    . NASAL SINUS SURGERY    . NECK LESION BIOPSY     cysts benign  . TUBAL LIGATION    . UMBILICAL HERNIA REPAIR      Social History:  reports that she has never smoked. She has never used smokeless tobacco. She reports that she does not drink alcohol or use drugs.  Allergies: Allergies  Allergen Reactions  . Guaifenesin & Derivatives Nausea And Vomiting  . Clindamycin Other (See Comments)    GI intolerance  . Clindamycin/Lincomycin Cross Reactors Other (See Comments)    GI intolerance  . Codeine Other (See Comments)    hallucinations  . Sulfa Antibiotics Other (See Comments)    unspecified    Family History:  Family  History  Problem Relation Age of Onset  . Cancer Mother 2970       colon ca  . Heart disease Mother   . Colon cancer Mother   . Thyroid disease Mother   . Cancer Father   . Thyroid disease Daughter   . Esophageal cancer Neg Hx   . Stomach cancer Neg Hx      Current Outpatient Medications:  .  ALPRAZolam (XANAX) 0.5 MG tablet, Take 1 tablet (0.5 mg total) by mouth 2 (two) times daily., Disp: 60 tablet, Rfl: 0 .  Alum Hydroxide-Mag Carbonate (GAVISCON PO), Take by mouth as needed., Disp: , Rfl:  .  amLODipine (NORVASC) 5 MG tablet, Take 1 tablet (5 mg total) by mouth daily. (Patient taking differently: Take 5 mg by mouth daily as needed. ), Disp: 90 tablet, Rfl: 3 .  docusate sodium (STOOL SOFTENER) 100 MG capsule, Take 100 mg by mouth daily., Disp: , Rfl:  .  levothyroxine (SYNTHROID, LEVOTHROID) 88 MCG tablet, TAKE 1 TABLET BY MOUTH EVERY DAY, Disp: 90 tablet, Rfl: 1 .  polyethylene glycol (MIRALAX / GLYCOLAX) packet, Take 17 g by mouth daily., Disp: , Rfl:  .  Probiotic Product (PROBIOTIC-10 PO), Take by mouth daily., Disp: , Rfl:  .  omeprazole (PRILOSEC) 40 MG capsule, Take 1 capsule (40 mg total) by mouth daily before breakfast. (Patient not taking: Reported  on 07/23/2018), Disp: 30 capsule, Rfl: 11  Review of Systems:  Constitutional: Denies fever, chills, diaphoresis, appetite change and fatigue.  HEENT: Denies photophobia, eye pain, redness, hearing loss, ear pain, congestion, sore throat, rhinorrhea, sneezing, mouth sores, trouble swallowing, neck pain, neck stiffness and tinnitus.   Respiratory: Denies SOB, DOE, cough, chest tightness,  and wheezing.   Cardiovascular: Denies chest pain, palpitations and leg swelling.  Gastrointestinal: Denies nausea, vomiting, abdominal pain, diarrhea, blood in stool , positive for constipation and abdominal distention. Genitourinary: Denies dysuria, urgency, frequency, hematuria, flank pain and difficulty urinating.  Endocrine: Denies: hot or  cold intolerance, sweats, changes in hair or nails, polyuria, polydipsia. Musculoskeletal: Denies myalgias, back pain, joint swelling, arthralgias and gait problem.  Skin: Denies pallor, rash and wound.  Neurological: Denies dizziness, seizures, syncope, weakness, light-headedness, numbness and headaches.  Hematological: Denies adenopathy. Easy bruising, personal or family bleeding history  Psychiatric/Behavioral: Denies suicidal ideation, mood changes, confusion, nervousness, sleep disturbance and agitation    Physical Exam: Vitals:   07/23/18 0936  BP: 132/68  Pulse: (!) 57  Temp: 97.8 F (36.6 C)  TempSrc: Oral  SpO2: 93%  Weight: 121 lb 11.2 oz (55.2 kg)  Height: 5\' 4"  (1.626 m)    Body mass index is 20.89 kg/m.   Constitutional: NAD, calm, comfortable Eyes: PERRL, lids and conjunctivae normal ENMT: Mucous membranes are moist.  Neck: normal, supple, no masses, no thyromegaly Respiratory: clear to auscultation bilaterally, no wheezing, no crackles. Normal respiratory effort. No accessory muscle use.  Cardiovascular: Regular rate and rhythm, no murmurs / rubs / gallops. No extremity edema. 2+ pedal pulses. No carotid bruits.  Abdomen: no tenderness, no masses palpated. No hepatosplenomegaly. Bowel sounds positive.  Musculoskeletal: no clubbing / cyanosis. No joint deformity upper and lower extremities. Good ROM, no contractures. Normal muscle tone.  Neurologic: Grossly intact and nonfocal Psychiatric: Normal judgment and insight. Alert and oriented x 3. Normal mood.    Impression and Plan:  Hypothyroidism, unspecified type  -We will recheck TSH today per her request.  Last level was normal at 0.98 in September 2018.  Essential hypertension -Well-controlled on amlodipine.  Helicobacter pylori (H. pylori) infection -Completed therapy, per her report she was retested and was told that her H. pylori was eradicated.  Constipation, unspecified constipation type -She  continues to have issues with constipation and abdominal distention. -She has not been taking MiraLAX or omeprazole. -Have advised her to take MiraLAX daily as well as omeprazole as prescribed by GI, she is also instructed to take some probiotics and to eat yogurt on a daily basis. -She will follow up with us or with GI if she continues to have issues.    Patient Instructions  -It was nice meeting you today!  -We will check your thyroid levels today. Will call you when results are available.  -Schedule follow up in 3-4 months for your annual wellness visit. Please come in fasting that day.     Chaya JanEstela Hernandez Acosta, MD [facility]

## 2018-07-23 NOTE — Telephone Encounter (Signed)
Copied from CRM [id number]. Topic: Quick Communication - Rx Refill/Question >> Jul 23, 2018 10:53 AM [name], [name] wrote: Medication: ALPRAZolam [name](XANAX) 0.5 MG tablet  Patient called to request a refill for the above medication  Preferred Pharmacy (with phone number or street name): [id number] - [place] [place] - [address] [phone] (Phone) [phone] (Fax)

## 2018-08-06 ENCOUNTER — Other Ambulatory Visit: Payer: Self-pay | Admitting: Internal Medicine

## 2018-08-06 DIAGNOSIS — E039 Hypothyroidism, unspecified: Secondary | ICD-10-CM

## 2018-08-06 MED ORDER — LEVOTHYROXINE SODIUM 100 MCG PO TABS: 100.0000 ug | ORAL_TABLET | Freq: Every day | ORAL | 3 refills | Status: DC

## 2018-09-01 ENCOUNTER — Ambulatory Visit (INDEPENDENT_AMBULATORY_CARE_PROVIDER_SITE_OTHER): Payer: Medicare HMO | Admitting: Gastroenterology

## 2018-09-01 ENCOUNTER — Encounter: Payer: Self-pay | Admitting: Gastroenterology

## 2018-09-01 VITALS — BP 158/78 | HR 82 | Ht 64.5 in | Wt 122.0 lb

## 2018-09-01 DIAGNOSIS — R1013 Epigastric pain: Secondary | ICD-10-CM

## 2018-09-01 DIAGNOSIS — K5900 Constipation, unspecified: Secondary | ICD-10-CM

## 2018-09-01 MED ORDER — SUCRALFATE 1 GM/10ML PO SUSP: 1.0000 g | Freq: Three times a day (TID) | ORAL | 5 refills | Status: DC

## 2018-09-01 NOTE — Patient Instructions (Signed)
Carafate 1gm liquid. Take 1 dose three times daily with food  Citrrucel daily  Please call in 5-6 weeks to let us know how you are doing

## 2018-09-01 NOTE — Progress Notes (Signed)
Review of pertinent gastrointestinal problems: 1.  Abdominal pain, nausea.  Found to have H. Pylori + gastritis: 12/30/2017 EGD with gastritis. Biopsies were positive for H. pylori. She was called in a 14-day course of Pylera.Later changed to triple therapy with Amoxicillin,Clindamycin andOmeprazole.  CT scan abdomen pelvis with IV contrast and oral contrast October 2019 showed moderate volume of stool, otherwise normal.  November 2019 H. pylori stool testing was negative. 2. FH of colon cancer (mother in her 55s): Colonoscopy 05/2017 No polyps, recall recommended at 5 years.    HPI: This is a pleasant 73 yo woman here to discuss her bowel habits, her dysepsia.  I last saw her here in the office about 6 weeks ago.  She was having some abdominal pains, nausea and chronic constipation.  Since H. pylori stool antigen testing was negative I want her to focus on her constipation and so she was going to start taking her prune juice on a daily basis instead of just as needed.  I also gave her prescription for omeprazole for some heartburn that have been acting up.  She briefly tried daily prune juice and then changed to daily miralax.  She gave that a few days but says it made things worse with loose stools.  Lately she has scibbolous sounding stools 1-2 times daily. No bleeding. She also very briefly tried omeprazole daily but says it didn't help at all. She is still very bothered by a 'raw sensation' in her abdomen.  She is quite scattered in her history and I found it difficult to really understand what she's been taking and why she's been changing variously for her bowels, for her 'raw sensation.'   ROS: complete GI ROS as described in HPI, all other review negative.  Constitutional:  No unintentional weight loss   Past Medical History:  Diagnosis Date  . Allergy    seasonal  . Anxiety   . Constipation   . H. pylori infection   . Hypertension   . Hypothyroid   . Scoliosis     Past  Surgical History:  Procedure Laterality Date  . APPENDECTOMY    . NASAL SINUS SURGERY    . NECK LESION BIOPSY     cysts benign  . TUBAL LIGATION    . UMBILICAL HERNIA REPAIR      Current Outpatient Medications  Medication Sig Dispense Refill  . ALPRAZolam (XANAX) 0.5 MG tablet Take 1 tablet (0.5 mg total) by mouth 2 (two) times daily. 60 tablet 0  . Alum Hydroxide-Mag Carbonate (GAVISCON PO) Take by mouth as needed.    Marland Kitchen amLODipine (NORVASC) 5 MG tablet Take 1 tablet (5 mg total) by mouth daily. (Patient taking differently: Take 5 mg by mouth daily as needed. ) 90 tablet 3  . levothyroxine (SYNTHROID, LEVOTHROID) 100 MCG tablet Take 1 tablet (100 mcg total) by mouth daily. 90 tablet 3  . Probiotic Product (PROBIOTIC-10 PO) Take by mouth daily.    Marland Kitchen omeprazole (PRILOSEC) 40 MG capsule Take 1 capsule (40 mg total) by mouth daily before breakfast. (Patient not taking: Reported on 07/23/2018) 30 capsule 11  . sucralfate (CARAFATE) 1 GM/10ML suspension Take 10 mLs (1 g total) by mouth 3 (three) times daily with meals. 900 mL 5   No current facility-administered medications for this visit.     Allergies as of 09/01/2018 - Review Complete 09/01/2018  Allergen Reaction Noted  . Guaifenesin & derivatives Nausea And Vomiting 09/23/2012  . Clindamycin Other (See Comments) 02/27/2015  .  Clindamycin/lincomycin cross reactors Other (See Comments) 10/08/2010  . Codeine Other (See Comments)   . Sulfa antibiotics Other (See Comments)     Family History  Problem Relation Age of Onset  . Cancer Mother 1170       colon ca  . Heart disease Mother   . Colon cancer Mother   . Thyroid disease Mother   . Cancer Father   . Thyroid disease Daughter   . Esophageal cancer Neg Hx   . Stomach cancer Neg Hx     Social History   Socioeconomic History  . Marital status: Divorced    Spouse name: Not on file  . Number of children: 3  . Years of education: Not on file  . Highest education level: Not on  file  Occupational History  . Occupation: Ambulance personCMA    Employer: [employer]  Social Needs  . Financial resource strain: Not on file  . Food insecurity:    Worry: Not on file    Inability: Not on file  . Transportation needs:    Medical: Not on file    Non-medical: Not on file  Tobacco Use  . Smoking status: Never Smoker  . Smokeless tobacco: Never Used  Substance and Sexual Activity  . Alcohol use: No    Alcohol/week: 0.0 standard drinks  . Drug use: No  . Sexual activity: Not on file  Lifestyle  . Physical activity:    Days per week: Not on file    Minutes per session: Not on file  . Stress: Not on file  Relationships  . Social connections:    Talks on phone: Not on file    Gets together: Not on file    Attends religious service: Not on file    Active member of club or organization: Not on file    Attends meetings of clubs or organizations: Not on file    Relationship status: Not on file  . Intimate partner violence:    Fear of current or ex partner: Not on file    Emotionally abused: Not on file    Physically abused: Not on file    Forced sexual activity: Not on file  Other Topics Concern  . Not on file  Social History Narrative  . Not on file     Physical Exam: BP (!) 158/78   Pulse 82   Ht 5' 4.5" (1.638 m)   Wt 122 lb (55.3 kg)   BMI 20.62 kg/m  Constitutional: generally well-appearing Psychiatric: alert and oriented x3 Abdomen: soft, nontender, nondistended, no obvious ascites, no peritoneal signs, normal bowel sounds No peripheral edema noted in lower extremities  Assessment and plan: 73 y.o. female with scibbolous stools, dyspeptic sensation  I think her symptoms are very likely functional.  It is hard to follow her history and she is a bit scattered with meds, symptoms.  She's had CT, EGD, colonoscopy all in the last year or so and I don't think any need to be repeated.  I'm going to focus on symptom management. SHe will try daily OTC fiber  supplement for her bowels and will try carafate TID with meals (new script called in today).  She will contact me in 4-5 weeks to report on her response.  Please see the "Patient Instructions" section for addition details about the plan.  Rob Buntinganiel Bailei Buist, MD [facility] 09/02/2018, 7:09 AM

## 2018-09-02 ENCOUNTER — Encounter: Payer: Self-pay | Admitting: Gastroenterology

## 2018-09-11 IMAGING — CR DG CHEST 2V
2 series · 2 of 2 positions shown · non-contrast
Comparison: 09/12/2012

CLINICAL DATA: Chest pain under the left breast

EXAM:
CHEST  2 VIEW

[w chest pa]
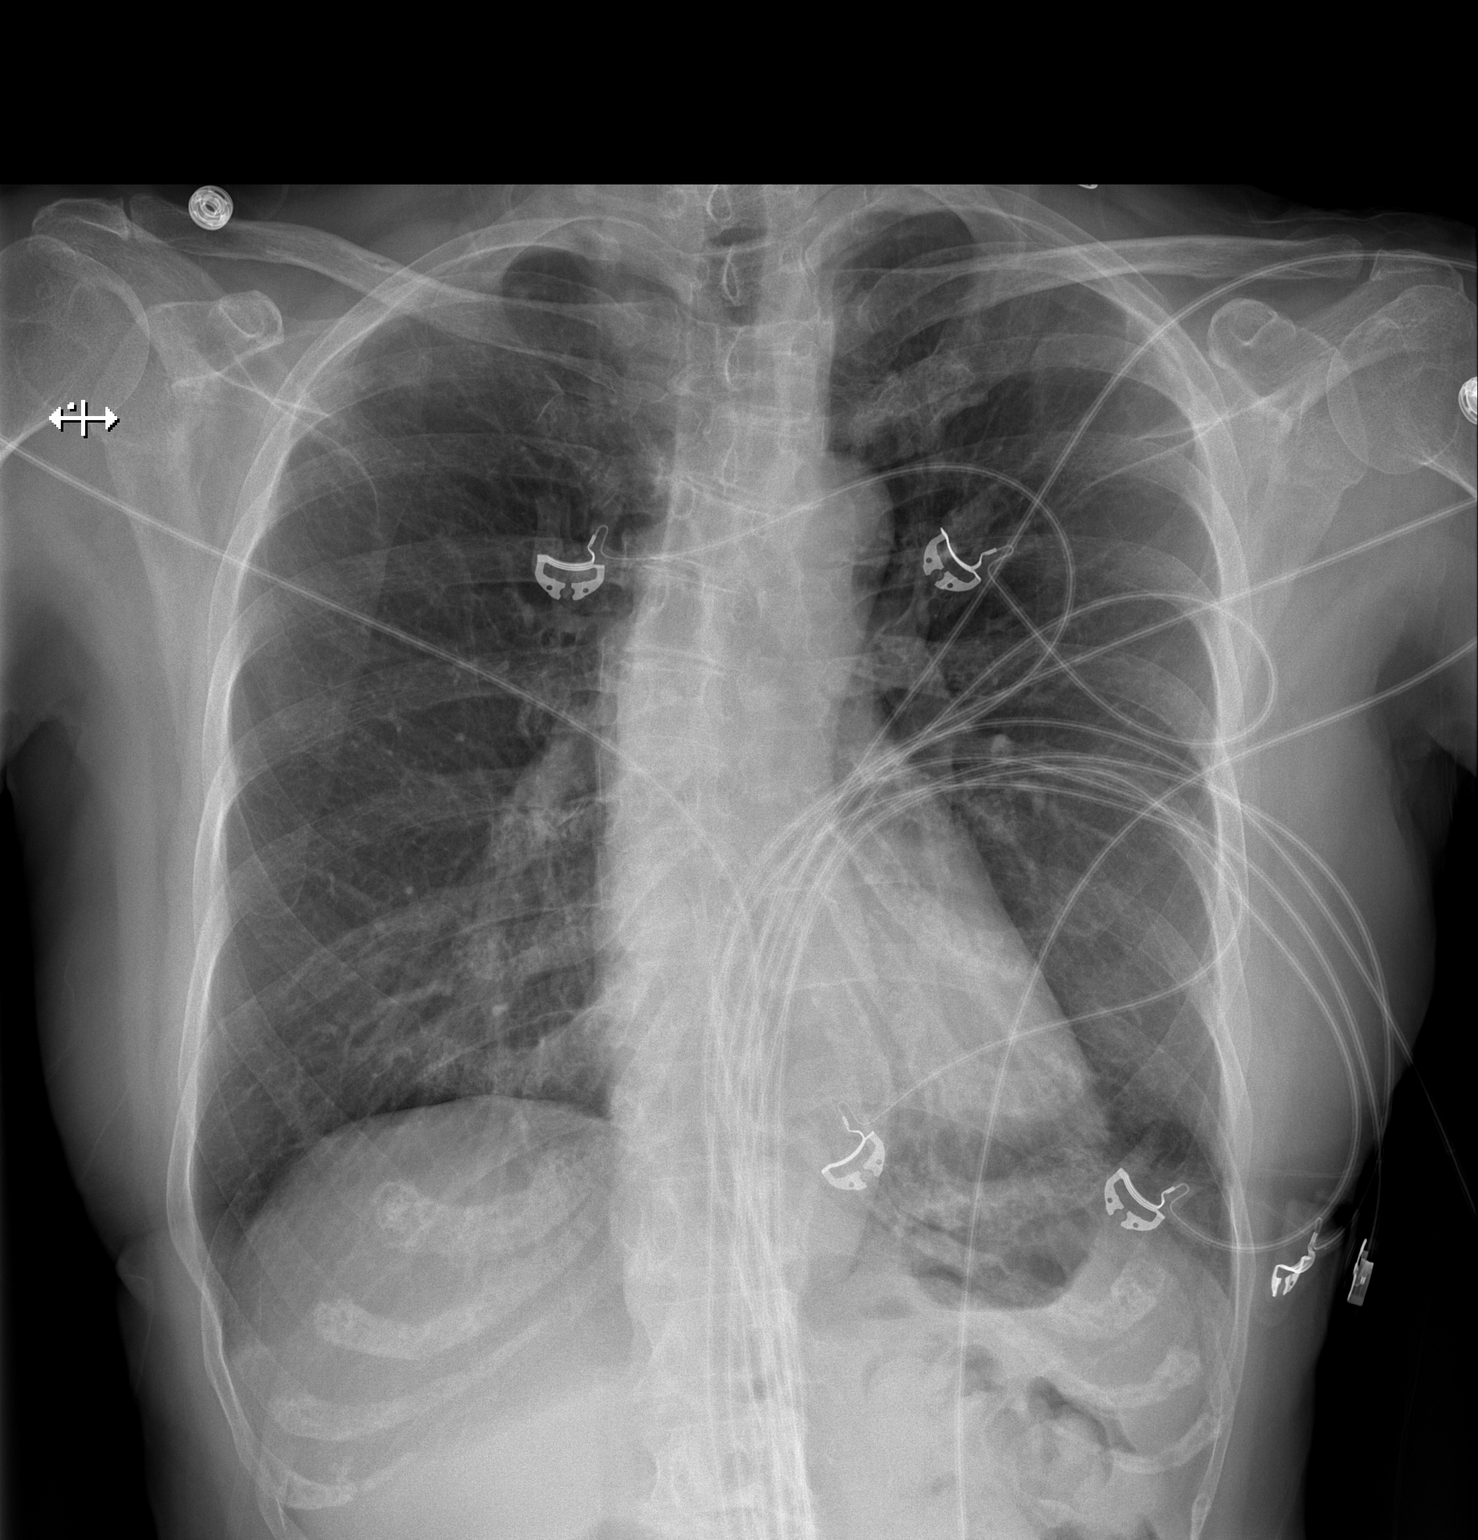

[w chest lat]
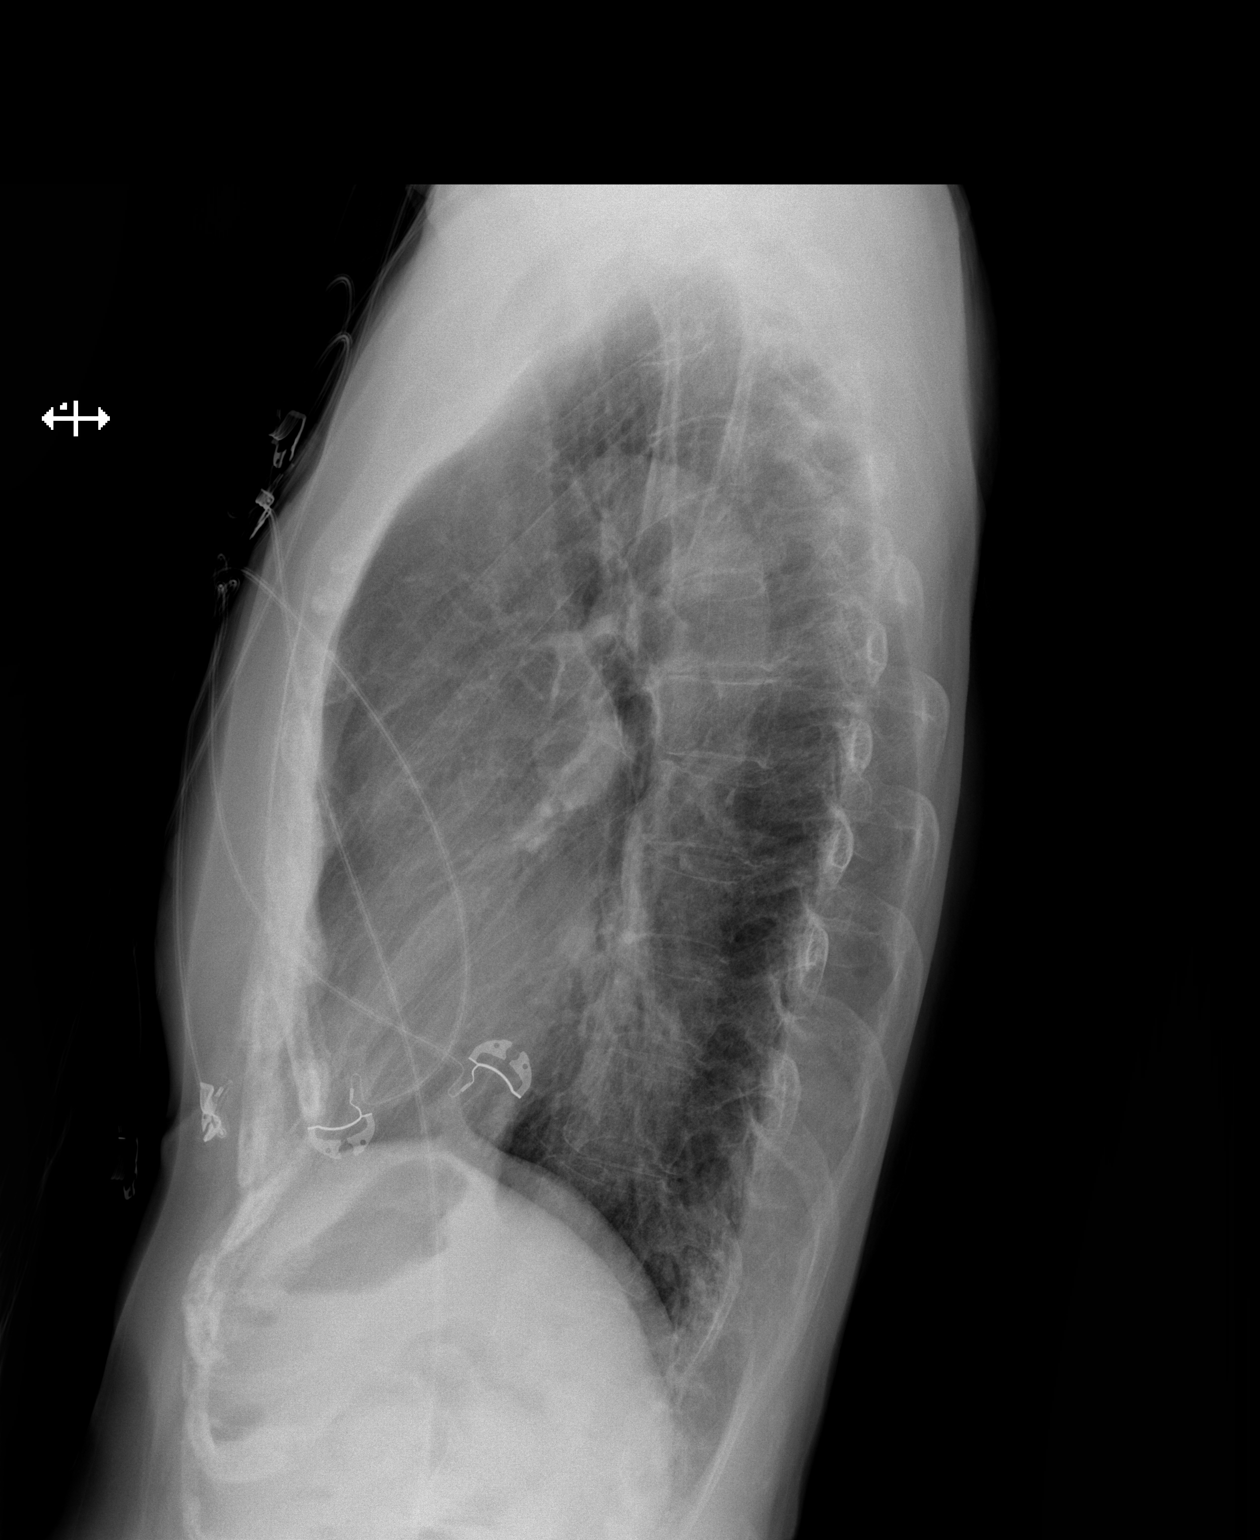

[2 of 2 positions shown; findings below may reference images not displayed]

FINDINGS: The lungs are hyperinflated. No acute infiltrate, consolidation or
effusion. Cardiomediastinal silhouette within normal limits. No
pneumothorax. Stable scoliosis of the spine.
IMPRESSION: Hyperinflation without acute infiltrate

## 2018-09-17 ENCOUNTER — Other Ambulatory Visit (INDEPENDENT_AMBULATORY_CARE_PROVIDER_SITE_OTHER): Payer: Medicare HMO

## 2018-09-17 ENCOUNTER — Other Ambulatory Visit: Payer: Self-pay

## 2018-09-17 DIAGNOSIS — E039 Hypothyroidism, unspecified: Secondary | ICD-10-CM

## 2018-09-17 LAB — TSH: TSH: 2.34 u[IU]/mL (ref 0.35–4.50)

## 2018-11-05 ENCOUNTER — Telehealth: Payer: Self-pay

## 2018-11-05 NOTE — Telephone Encounter (Signed)
Author phoned pt. to assess interest in scheduling virtual awv. Pt. Stated she was not interested in doing virtual or in-office wellness visit with either HC or PCP.

## 2019-01-15 ENCOUNTER — Ambulatory Visit (INDEPENDENT_AMBULATORY_CARE_PROVIDER_SITE_OTHER): Payer: Medicare HMO | Admitting: Internal Medicine

## 2019-01-15 ENCOUNTER — Other Ambulatory Visit: Payer: Self-pay

## 2019-01-15 ENCOUNTER — Ambulatory Visit: Payer: Self-pay

## 2019-01-15 ENCOUNTER — Telehealth: Payer: Self-pay | Admitting: *Deleted

## 2019-01-15 DIAGNOSIS — Z20828 Contact with and (suspected) exposure to other viral communicable diseases: Secondary | ICD-10-CM

## 2019-01-15 DIAGNOSIS — R51 Headache: Secondary | ICD-10-CM

## 2019-01-15 DIAGNOSIS — R509 Fever, unspecified: Secondary | ICD-10-CM | POA: Diagnosis not present

## 2019-01-15 DIAGNOSIS — Z20822 Contact with and (suspected) exposure to covid-19: Secondary | ICD-10-CM

## 2019-01-15 NOTE — Telephone Encounter (Signed)
Incoming call from Patient stating that she had an appointment  Today.  Dr.  Jerilee Hoh who recommended Patient be tested for Covid-19 .  Related to Patient that we need an order placed for the Covid-19 drive thru.  Wills send telephone encounter to remind Dr.  Jerilee Hoh to send order.     Reason for Disposition . COVID-19 Testing, questions about  Answer Assessment - Initial Assessment Questions 1. COVID-19 DIAGNOSIS: "Who made your Coronavirus (COVID-19) diagnosis?" "Was it confirmed by a positive lab test?" If not diagnosed by a HCP, ask "Are there lots of cases (community spread) where you live?" (See public health department website, if unsure)     *No Answer* 2. ONSET: "When did the COVID-19 symptoms start?"     Monday 3. WORST SYMPTOM: "What is your worst symptom?" (e.g., cough, fever, shortness of breath, muscle aches)    Headache sinus 4. COUGH: "Do you have a cough?" If so, ask: "How bad is the cough?"       denies 5. FEVER: "Do you have a fever?" If so, ask: "What is your temperature, how was it measured, and when did it start?"     98.7 6. RESPIRATORY STATUS: "Describe your breathing?" (e.g., shortness of breath, wheezing, unable to speak)     denies 7. BETTER-SAME-WORSE: "Are you getting better, staying the same or getting worse compared to yesterday?"  If getting worse, ask, "In what way?"     same 8. HIGH RISK DISEASE: "Do you have any chronic medical problems?" (e.g., asthma, heart or lung disease, weak immune system, etc.)     Low thyroid 9. PREGNANCY: "Is there any chance you are pregnant?" "When was your last menstrual period?"    na 10. OTHER SYMPTOMS: "Do you have any other symptoms?"  (e.g., chills, fatigue, headache, loss of smell or taste, muscle pain, sore throat)       Headache maybe alittle horse  Protocols used: CORONAVIRUS (COVID-19) DIAGNOSED OR SUSPECTED-A-AH

## 2019-01-15 NOTE — Telephone Encounter (Signed)
Covid testing order was placed

## 2019-01-15 NOTE — Telephone Encounter (Signed)
Called pt to schedule her COVID-19 test Dr. Jerilee Hoh requested.   I left a voicemail for her to return the call to [phone] and any nurse could assist her I scheduling this test.

## 2019-01-15 NOTE — Progress Notes (Signed)
Virtual Visit via Video Note  I connected with ADWOA AXE on 01/15/19 at 11:30 AM EDT by a video enabled telemedicine application and verified that I am speaking with the correct person using two identifiers.  Location patient: home Location provider: work office Persons participating in the virtual visit: patient, provider  I discussed the limitations of evaluation and management by telemedicine and the availability of in person appointments. The patient expressed understanding and agreed to proceed.   HPI: Patient has scheduled this visit due to some acute concerns.  For 48 hours she has what she believes is a sinus infection.  She has had pain above her eyes, chills, felt feverish but when she took her temperature it was 99.7.  She has not had cough or shortness of breath.  She has also had conjunctival injection, headache.  No loss of taste or smell.  She also developed loose stools but this is better after taking some Imodium.  She has taken Claritin-D and Flonase without much relief.  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  There are no positive contacts that she knows of.   ROS: Constitutional: Positive for fever, chills, diaphoresis, appetite change and fatigue.  HEENT: Denies photophobia, mouth sores, trouble swallowing, neck pain, neck stiffness and tinnitus.   Respiratory: Denies SOB, DOE, cough, chest tightness,  and wheezing.   Cardiovascular: Denies chest pain, palpitations and leg swelling.  Gastrointestinal: Denies nausea, vomiting, abdominal pain, diarrhea, constipation, blood in stool and abdominal distention.  Genitourinary: Denies dysuria, urgency, frequency, hematuria, flank pain and difficulty urinating.  Endocrine: Denies: hot or cold intolerance, sweats, changes in hair or nails, polyuria, polydipsia. Musculoskeletal: Denies myalgias, back pain, joint swelling, arthralgias and gait problem.  Skin: Denies pallor,  rash and wound.  Neurological: Denies dizziness, seizures, syncope, weakness, light-headedness, numbness and headaches.  Hematological: Denies adenopathy. Easy bruising, personal or family bleeding history  Psychiatric/Behavioral: Denies suicidal ideation, mood changes, confusion, nervousness, sleep disturbance and agitation   Past Medical History:  Diagnosis Date  . Allergy    seasonal  . Anxiety   . Constipation   . H. pylori infection   . Hypertension   . Hypothyroid   . Scoliosis     Past Surgical History:  Procedure Laterality Date  . APPENDECTOMY    . NASAL SINUS SURGERY    . NECK LESION BIOPSY     cysts benign  . TUBAL LIGATION    . UMBILICAL HERNIA REPAIR      Family History  Problem Relation Age of Onset  . Cancer Mother 38       colon ca  . Heart disease Mother   . Colon cancer Mother   . Thyroid disease Mother   . Cancer Father   . Thyroid disease Daughter   . Esophageal cancer Neg Hx   . Stomach cancer Neg Hx     SOCIAL HX:   reports that she has never smoked. She has never used smokeless tobacco. She reports that she does not drink alcohol or use drugs.   Current Outpatient Medications:  .  ALPRAZolam (XANAX) 0.5 MG tablet, Take 1 tablet (0.5 mg total) by mouth 2 (two) times daily., Disp: 60 tablet, Rfl: 0 .  Alum Hydroxide-Mag Carbonate (GAVISCON PO), Take by mouth as needed., Disp: , Rfl:  .  amLODipine (NORVASC) 5 MG tablet, Take 1 tablet (5 mg total) by mouth daily. (Patient taking differently: Take 5 mg by mouth daily  as needed. ), Disp: 90 tablet, Rfl: 3 .  levothyroxine (SYNTHROID, LEVOTHROID) 100 MCG tablet, Take 1 tablet (100 mcg total) by mouth daily., Disp: 90 tablet, Rfl: 3 .  omeprazole (PRILOSEC) 40 MG capsule, Take 1 capsule (40 mg total) by mouth daily before breakfast. (Patient not taking: Reported on 07/23/2018), Disp: 30 capsule, Rfl: 11 .  Probiotic Product (PROBIOTIC-10 PO), Take by mouth daily., Disp: , Rfl:  .  sucralfate  (CARAFATE) 1 GM/10ML suspension, Take 10 mLs (1 g total) by mouth 3 (three) times daily with meals., Disp: 900 mL, Rfl: 5  EXAM:   VITALS per patient if applicable: None reported other than a temp of 99 7  GENERAL: alert, oriented, appears well and in no acute distress  HEENT: atraumatic, conjunttiva is erythematous, no obvious abnormalities on inspection of external nose and ears  NECK: normal movements of the head and neck  LUNGS: on inspection no signs of respiratory distress, breathing rate appears normal, no obvious gross increased work of breathing, gasping or wheezing  CV: no obvious cyanosis  MS: moves all visible extremities without noticeable abnormality  PSYCH/NEURO: pleasant and cooperative, no obvious depression or anxiety, speech and thought processing grossly intact  ASSESSMENT AND PLAN:   Suspected Covid-19 Virus Infection -With her symptoms in the area of the COVID-19 pandemic, I am suspicious for this. -[facility]. -Have advised symptomatic management consisting of Tylenol, Claritin-D and Mucinex. -Will follow-up on her next week to see how she is doing.    I discussed the assessment and treatment plan with the patient. The patient was provided an opportunity to ask questions and all were answered. The patient agreed with the plan and demonstrated an understanding of the instructions.   The patient was advised to call back or seek an in-person evaluation if the symptoms worsen or if the condition fails to improve as anticipated.    Chaya JanEstela Hernandez Acosta, MD  [facility]

## 2019-01-18 ENCOUNTER — Other Ambulatory Visit: Payer: Medicare HMO

## 2019-01-18 ENCOUNTER — Ambulatory Visit: Payer: Self-pay | Admitting: Internal Medicine

## 2019-01-18 DIAGNOSIS — Z20822 Contact with and (suspected) exposure to covid-19: Secondary | ICD-10-CM

## 2019-01-18 DIAGNOSIS — R6889 Other general symptoms and signs: Secondary | ICD-10-CM | POA: Diagnosis not present

## 2019-01-18 NOTE — Telephone Encounter (Signed)
Pt called returning a call we placed on Friday to schedule the COVID-19 test. I scheduled her for today at 10:30 at the [facility] location.   I let her know to stay in the car and wear a mask.  Been having nasal congestion and fever on and off for a week.   Referred by Dr. Jerilee Hoh.  Insur:  [employer]  Order placed

## 2019-01-20 LAB — NOVEL CORONAVIRUS, NAA: SARS-CoV-2, NAA: NOT DETECTED

## 2019-01-22 ENCOUNTER — Telehealth: Payer: Self-pay | Admitting: *Deleted

## 2019-01-22 NOTE — Telephone Encounter (Signed)
Copied from [place] [id number]. Topic: General - Inquiry >> Jan 22, 2019 11:48 AM Mathis Bud wrote: Reason for CRM: patient called stating she is still congestion in her face, Patient did test negative for covid and would like medication further for her congestion.  Call back # [phone]

## 2019-01-26 NOTE — Telephone Encounter (Signed)
If she is still having symptoms, she may need to be seen at an UC. Unfortunately, we cannot see pts with URI symptoms at the moment.

## 2019-01-26 NOTE — Telephone Encounter (Signed)
Pt called back and states that she is still having facial congestion; she has used Mucinex and Claritin D; she has also used a sinus saline flush; the pt says that she does not have a fever, but she does have nausea from the drainage; per Tammy at [facility], Apolonio Schneiders would like for this information be routed to the office; the pt can be contacted at  [phone] and a message can be left on the voicemail.

## 2019-01-26 NOTE — Telephone Encounter (Signed)
Attempted to call patient but the patient hung up. Will try again at another time CRM

## 2019-01-26 NOTE — Telephone Encounter (Signed)
Left message on machine for patient to see how she is doing. CRM

## 2019-01-27 NOTE — Telephone Encounter (Signed)
Left message on machine for patient to return our call CRM 

## 2019-02-20 ENCOUNTER — Other Ambulatory Visit: Payer: Self-pay | Admitting: Internal Medicine

## 2019-03-02 NOTE — Telephone Encounter (Signed)
Copied from [place] [id number]. Topic: General - Other >> Feb 26, 2019  2:37 PM Leward Quan A wrote: Reason for CRM: Patient called to say that she requested a refill on her ALPRAZolam Duanne Moron) 0.5 MG tablet last filled in December of 2019 she states that she take it at night only when her BP is elevated per previous physician. Patient is asking for a call back or the Rx sent to the pharmacy please Ph# [phone]

## 2019-04-06 ENCOUNTER — Other Ambulatory Visit (INDEPENDENT_AMBULATORY_CARE_PROVIDER_SITE_OTHER): Payer: Medicare HMO

## 2019-04-06 ENCOUNTER — Ambulatory Visit: Payer: Medicare HMO | Admitting: Physician Assistant

## 2019-04-06 ENCOUNTER — Encounter: Payer: Self-pay | Admitting: Physician Assistant

## 2019-04-06 VITALS — BP 116/64 | HR 60 | Temp 98.0°F | Ht 63.5 in | Wt 125.2 lb

## 2019-04-06 DIAGNOSIS — R198 Other specified symptoms and signs involving the digestive system and abdomen: Secondary | ICD-10-CM

## 2019-04-06 DIAGNOSIS — K5900 Constipation, unspecified: Secondary | ICD-10-CM | POA: Diagnosis not present

## 2019-04-06 DIAGNOSIS — Z800 Family history of malignant neoplasm of digestive organs: Secondary | ICD-10-CM | POA: Diagnosis not present

## 2019-04-06 LAB — TSH: TSH: 13.35 u[IU]/mL — ABNORMAL HIGH (ref 0.35–4.50)

## 2019-04-06 NOTE — Progress Notes (Signed)
I agree with the above note, plan 

## 2019-04-06 NOTE — Patient Instructions (Signed)
If you are age 74 or older, your body mass index should be between 23-30. Your Body mass index is 21.84 kg/m. If this is out of the aforementioned range listed, please consider follow up with your Primary Care Provider.  If you are age 52 or younger, your body mass index should be between 19-25. Your Body mass index is 21.84 kg/m. If this is out of the aformentioned range listed, please consider follow up with your Primary Care Provider.   Your provider has requested that you go to the basement level for lab work before leaving today. Press "B" on the elevator. The lab is located at the first door on the left as you exit the elevator.  Add Benefiber daily in prune juice.  Increase water intake to 60 ounces daily.  You have been given samples of Linzess 145 mcg. Call back in 2-3 weeks with an update.  Ask for Amy's nurse.  Thank you for choosing me and [facility].   Amy Esterwood, PA-C

## 2019-04-06 NOTE — Progress Notes (Signed)
Subjective:    Patient ID: Julie Duffy, female    DOB: [birth year], 73 y.o.   MRN: [id number]  HPI Julie Duffy is a pleasant 73 year old white female, established with Dr. Ardis Duffy with history of H. pylori gastritis, and family history of colon cancer in her mother.  Also with hypertension and hypothyroidism. She comes in today with ongoing concerns about generalized abdominal fullness.  She says that Dr. Ardis Duffy told her to try to eat 3 meals per day but she is generally only eating 2 meals per day and says she does not tend to get hungry in between.  When she is working she may go as long as 12 hours without eating.  Her weight has been fairly stable though she has a difficult time gaining weight.  She says she continues to feel full all the time.  She feels that this is primarily from constipation.  She has been going for 4 to 5 days between bowel movements on a regular basis.  If she does not have a bowel movement during that time.  She will take prune juice and a Dulcolax and will have a bowel movement but does not feel that she ever evacuates her bowel well. No current complaints of heartburn or indigestion, no dysphasia.  No nausea or vomiting. She does have history of hypothyroidism and says that her dose of Synthroid was increased this spring.  She had used MiraLAX in the past with fairly good results but says it seemed to stop working. Last colonoscopy October 2018 no polyps she did have small internal hemorrhoids. EGD May 2019 moderate diffuse gastritis.  Biopsies positive for H. pylori.  She was treated with Pylera and had negative H. pylori stool antigen in November 2019.  Review of Systems Pertinent positive and negative review of systems were noted in the above HPI section.  All other review of systems was otherwise negative.  Outpatient Encounter Medications as of 04/06/2019  Medication Sig  . ALPRAZolam (XANAX) 0.5 MG tablet TAKE 1 TABLET (0.5 MG TOTAL) BY MOUTH 2 (TWO) TIMES DAILY.  Marland Kitchen  amLODipine (NORVASC) 5 MG tablet Take 1 tablet (5 mg total) by mouth daily. (Patient taking differently: Take 5 mg by mouth daily as needed. )  . Calcium Carbonate Antacid (ANTACID PO) Take by mouth as needed.  Marland Kitchen levothyroxine (SYNTHROID, LEVOTHROID) 100 MCG tablet Take 1 tablet (100 mcg total) by mouth daily.  . [DISCONTINUED] Alum Hydroxide-Mag Carbonate (GAVISCON PO) Take by mouth as needed.  . [DISCONTINUED] omeprazole (PRILOSEC) 40 MG capsule Take 1 capsule (40 mg total) by mouth daily before breakfast. (Patient not taking: Reported on 07/23/2018)  . [DISCONTINUED] Probiotic Product (PROBIOTIC-10 PO) Take by mouth daily.  . [DISCONTINUED] sucralfate (CARAFATE) 1 GM/10ML suspension Take 10 mLs (1 g total) by mouth 3 (three) times daily with meals.   No facility-administered encounter medications on file as of 04/06/2019.    Allergies  Allergen Reactions  . Guaifenesin & Derivatives Nausea And Vomiting  . Clindamycin Other (See Comments)    GI intolerance  . Clindamycin/Lincomycin Cross Reactors Other (See Comments)    GI intolerance  . Codeine Other (See Comments)    hallucinations  . Sulfa Antibiotics Other (See Comments)    unspecified   Patient Active Problem List   Diagnosis Date Noted  . Osteoarthritis of spine with radiculopathy, cervical region 08/16/2016  . Essential hypertension 09/28/2015  . Paresthesia and pain of extremity 09/02/2011  . VITILIGO 01/25/2008  . Hypothyroidism 07/16/2007   Social  History   Socioeconomic History  . Marital status: Divorced    Spouse name: Not on file  . Number of children: 3  . Years of education: Not on file  . Highest education level: Not on file  Occupational History  . Occupation: Ambulance personCMA    Employer: [employer]  Social Needs  . Financial resource strain: Not on file  . Food insecurity    Worry: Not on file    Inability: Not on file  . Transportation needs    Medical: Not on file    Non-medical: Not on file   Tobacco Use  . Smoking status: Never Smoker  . Smokeless tobacco: Never Used  Substance and Sexual Activity  . Alcohol use: No    Alcohol/week: 0.0 standard drinks  . Drug use: No  . Sexual activity: Not on file  Lifestyle  . Physical activity    Days per week: Not on file    Minutes per session: Not on file  . Stress: Not on file  Relationships  . Social Musicianconnections    Talks on phone: Not on file    Gets together: Not on file    Attends religious service: Not on file    Active member of club or organization: Not on file    Attends meetings of clubs or organizations: Not on file    Relationship status: Not on file  . Intimate partner violence    Fear of current or ex partner: Not on file    Emotionally abused: Not on file    Physically abused: Not on file    Forced sexual activity: Not on file  Other Topics Concern  . Not on file  Social History Narrative  . Not on file    Ms. Julie Duffy's family history includes Cancer in her father; Cancer (age of onset: 6770) in her mother; Colon cancer in her mother; Heart disease in her mother; Thyroid disease in her daughter and mother.      Objective:    Vitals:   04/06/19 0902  BP: 116/64  Pulse: 60  Temp: 98 F (36.7 C)    Physical Exam Well-developed well-nourished older female in no acute distress.  Height, Weight, 125 BMI 21.8  HEENT; nontraumatic normocephalic, EOMI, PER R LA, sclera anicteric. 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 #2.  History of H. pylori gastritis May 2019 treated with Pylera and eradicated by H. pylori stool antigen #3 family history of colon cancer in patient's mother  Plan; patient was advised to increase water intake to about 60 ounces per day. Add align 1 p.o. daily. Add Benefiber 1 dose daily in water or prune juice. Trial of Linzess 145 mcg p.o. daily.  Patient was given samples today and asked to try over the next 2 weeks.  If this is effective for her she is asked to call back and we will give her a prescription.  If intolerant or ineffective will try  another promotility agent.  Plan follow-up colonoscopy 2023 with Dr. Christella Hartigan.  Amy S Esterwood PA-C 04/06/2019   Cc: Philip Aspen, [place]*

## 2019-04-15 ENCOUNTER — Encounter: Payer: Self-pay | Admitting: Family Medicine

## 2019-04-15 ENCOUNTER — Telehealth (INDEPENDENT_AMBULATORY_CARE_PROVIDER_SITE_OTHER): Payer: Medicare HMO | Admitting: Family Medicine

## 2019-04-15 ENCOUNTER — Other Ambulatory Visit: Payer: Self-pay

## 2019-04-15 ENCOUNTER — Telehealth: Payer: Self-pay | Admitting: *Deleted

## 2019-04-15 DIAGNOSIS — K5900 Constipation, unspecified: Secondary | ICD-10-CM | POA: Diagnosis not present

## 2019-04-15 DIAGNOSIS — E039 Hypothyroidism, unspecified: Secondary | ICD-10-CM | POA: Diagnosis not present

## 2019-04-15 NOTE — Telephone Encounter (Signed)
Copied from [place] [id number]. Topic: General - Other >> Apr 14, 2019  3:00 PM Mathis Bud wrote: Reason for CRM: Patient called stating her thyroid levels were abnormal. Patient got labs done at [facility] and patient would like to speak with PCP or nurse regarding this. Call back  [phone]

## 2019-04-15 NOTE — Progress Notes (Signed)
Virtual Visit via Telephone Note  I connected with Julie Duffy on 04/15/19 at  3:00 PM EDT by telephone and verified that I am speaking with the correct person using two identifiers.   I discussed the limitations, risks, security and privacy concerns of performing an evaluation and management service by telephone and the availability of in person appointments. I also discussed with the patient that there may be a patient responsible charge related to this service. The patient expressed understanding and agreed to proceed.  Location patient: home Location provider: work or home office Participants present for the call: patient, provider Patient did not have a visit in the prior 7 days to address this/these issue(s).   History of Present Illness:  Acute visit for thyroid medication issue per patient: -reports she had a thyroid check with GI and level was 13.35 -she gets some constipation and GI told her needs to see PCP to up her dose -she chronically has issues with feeling tired -she has a history of palpitations, she feels like has has a little more palpitations then usual lately and wonders if that is related - denies CP, SOB, dizziness, exertional symptoms, walks dog and feels fine -BP is good doing the during the day, but sometimes SBP goes up to the 140s on the top in the evening and she wonders if this is all related -no sig increased stress or increased caffeine -she is anxious about increasing the thyroid dose which was advised by her gastroenterologist -she has not been taking the thyroid medication on an empty stomach    Observations/Objective: Patient sounds cheerful and well on the phone. I do not appreciate any SOB. Speech and thought processing are grossly intact. Patient reported vitals:  Assessment and Plan:  Elevated TSH Constipation Palpitations/Hypertension  -we discussed possible options for management of the elevated TSH in the setting of constipation.  Advised we typically would adjust the dose. She is anxious to do this. She admits to not taking the thyroid medication on an empty stomach and wants to try doing this everyday instead. Advise will need recheck in 6 weeks. Will send message to admin staff to schedule lab visit in 6 weeks. Will forward results to PCP for future management. She agrees to see her PCP about the palpitations and hypertension, she thought this was related to the thyroid level ..this may be possible, but advised in-person visit to assess as I am not able to evaluate this via a telephone visit. Advised she could seek evaluation at the [facility] today if she prefers. She prefers visit with PCP. Sent message to admin staff to assist in scheduling this as well. -of course, we advised Signa  to return or notify a doctor immediately if symptoms worsen or persist or new concerns arise.  Follow Up Instructions:  I did not refer this patient for an OV in the next 24 hours for this/these issue(s).  I discussed the assessment and treatment plan with the patient. The patient was provided an opportunity to ask questions and all were answered. The patient agreed with the plan and demonstrated an understanding of the instructions.   The patient was advised to call back or seek an in-person evaluation if the symptoms worsen or if the condition fails to improve as anticipated.  I provided 18 minutes of non-face-to-face time during this encounter.   Lucretia Kern, DO

## 2019-04-15 NOTE — Telephone Encounter (Signed)
Left detailed message on machine for patient to schedule a virtual or telephone visit to review lab results with Dr Jerilee Hoh. CRM

## 2019-04-19 ENCOUNTER — Other Ambulatory Visit: Payer: Self-pay

## 2019-04-19 ENCOUNTER — Ambulatory Visit (INDEPENDENT_AMBULATORY_CARE_PROVIDER_SITE_OTHER): Payer: Medicare HMO | Admitting: Internal Medicine

## 2019-04-19 ENCOUNTER — Encounter: Payer: Self-pay | Admitting: Internal Medicine

## 2019-04-19 ENCOUNTER — Ambulatory Visit: Payer: Self-pay | Admitting: *Deleted

## 2019-04-19 VITALS — BP 134/78 | HR 63 | Temp 97.9°F | Wt 122.8 lb

## 2019-04-19 DIAGNOSIS — Z79899 Other long term (current) drug therapy: Secondary | ICD-10-CM

## 2019-04-19 DIAGNOSIS — Z8619 Personal history of other infectious and parasitic diseases: Secondary | ICD-10-CM | POA: Diagnosis not present

## 2019-04-19 DIAGNOSIS — R002 Palpitations: Secondary | ICD-10-CM

## 2019-04-19 DIAGNOSIS — M419 Scoliosis, unspecified: Secondary | ICD-10-CM | POA: Diagnosis not present

## 2019-04-19 DIAGNOSIS — E039 Hypothyroidism, unspecified: Secondary | ICD-10-CM | POA: Diagnosis not present

## 2019-04-19 DIAGNOSIS — I10 Essential (primary) hypertension: Secondary | ICD-10-CM

## 2019-04-19 NOTE — Patient Instructions (Addendum)
ekg show no worrisome findings   Stay on  Thyroid med taking am without other meds.   Will hand off to dr Jerilee Hoh  But would consider ordering  Echocardiogram and  Fu labs   Plan to be contacted   About next step   Your exam today is reassuring .   Thyroid  Off can cause  Palpitations  And may need to be adjusted  At some point.         Palpitations Palpitations are feelings that your heartbeat is irregular or is faster than normal. It may feel like your heart is fluttering or skipping a beat. Palpitations are usually not a serious problem. They may be caused by many things, including smoking, caffeine, alcohol, stress, and certain medicines or drugs. Most causes of palpitations are not serious. However, some palpitations can be a sign of a serious problem. You may need further tests to rule out serious medical problems. Follow these instructions at home:     Pay attention to any changes in your condition. Take these actions to help manage your symptoms: Eating and drinking  Avoid foods and drinks that may cause palpitations. These may include: ? Caffeinated coffee, tea, soft drinks, diet pills, and energy drinks. ? Chocolate. ? Alcohol. Lifestyle  Take steps to reduce your stress and anxiety. Things that can help you relax include: ? Yoga. ? Mind-body activities, such as deep breathing, meditation, or using words and images to create positive thoughts (guided imagery). ? Physical activity, such as swimming, jogging, or walking. Tell your health care provider if your palpitations increase with activity. If you have chest pain or shortness of breath with activity, do not continue the activity until you are seen by your health care provider. ? Biofeedback. This is a method that helps you learn to use your mind to control things in your body, such as your heartbeat.  Do not use drugs, including cocaine or ecstasy. Do not use marijuana.  Get plenty of rest and sleep. Keep a  regular bed time. General instructions  Take over-the-counter and prescription medicines only as told by your health care provider.  Do not use any products that contain nicotine or tobacco, such as cigarettes and e-cigarettes. If you need help quitting, ask your health care provider.  Keep all follow-up visits as told by your health care provider. This is important. These may include visits for further testing if palpitations do not go away or get worse. Contact a health care provider if you:  Continue to have a fast or irregular heartbeat after 24 hours.  Notice that your palpitations occur more often. Get help right away if you:  Have chest pain or shortness of breath.  Have a severe headache.  Feel dizzy or you faint. Summary  Palpitations are feelings that your heartbeat is irregular or is faster than normal. It may feel like your heart is fluttering or skipping a beat.  Palpitations may be caused by many things, including smoking, caffeine, alcohol, stress, certain medicines, and drugs.  Although most causes of palpitations are not serious, some causes can be a sign of a serious medical problem.  Get help right away if you faint or have chest pain, shortness of breath, a severe headache, or dizziness. This information is not intended to replace advice given to you by your health care provider. Make sure you discuss any questions you have with your health care provider. Document Released: 07/19/2000 Document Revised: 09/03/2017 Document Reviewed: 09/03/2017 Elsevier Patient Education  2020 [employer]  

## 2019-04-19 NOTE — Telephone Encounter (Signed)
Reporting heart palpitations on/off over the last week. B/p today 130/80's and 135/95, p.63 bpm. The palpitations are happening more frequently. Sometimes takes a low dose xanax to stop the feeling. Has a pulling feeling between the breast that does not radiate and is constant over the last 4-5 days. 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 Levothyroxine dose increased to 100 mcg earlier this year and reports she has not felt the same since. No N/V, SOB/dizziness/sweating. No fever/no travels/no contacts. Transferred to pcp for appointment w/in 24 hours. Reviewed urgent symptoms to seek immediate evaluation for.   Reason for Disposition . [1] Chest pain lasts > 5 minutes AND [2] occurred > 3 days ago (72 hours) AND [3] NO chest pain or cardiac symptoms now  Answer Assessment - Initial Assessment Questions 1. LOCATION: "Where does it hurt?"       Feels like she has lifted something continuously 2. RADIATION: "Does the pain go anywhere else?" (e.g., into neck, jaw, arms, back)     none 3. ONSET: "When did the chest pain begin?" (Minutes, hours or days)      About one week ago 4. PATTERN "Does the pain come and go, or has it been constant since it started?"  "Does it get worse with exertion?"      constant 5. DURATION: "How long does it last" (e.g., seconds, minutes, hours)    minutes 6. SEVERITY: "How bad is the pain?"  (e.g., Scale 1-10; mild, moderate, or severe)    - MILD (1-3): doesn't interfere with normal activities     - MODERATE (4-7): interferes with normal activities or awakens from sleep    - SEVERE (8-10): excruciating pain, unable to do any normal activities       mild 7. CARDIAC RISK FACTORS: "Do you have any history of heart problems or risk factors for heart disease?" (e.g., angina, prior heart attack; diabetes, high blood pressure, high cholesterol, smoker, or strong family history of heart disease)     htn 8. PULMONARY RISK FACTORS:  "Do you have any history of lung disease?"  (e.g., blood clots in lung, asthma, emphysema, birth control pills)     no 9. CAUSE: "What do you think is causing the chest pain?"     Thyroid medication 10. OTHER SYMPTOMS: "Do you have any other symptoms?" (e.g., dizziness, nausea, vomiting, sweating, fever, difficulty breathing, cough)       none 11. PREGNANCY: "Is there any chance you are pregnant?" "When was your last menstrual period?"       na  Protocols used: CHEST PAIN-A-AH

## 2019-04-19 NOTE — Telephone Encounter (Signed)
Pt scheduled for OV today with provider.

## 2019-04-19 NOTE — Progress Notes (Signed)
Chief Complaint  Patient presents with  . Palpitations    pt states that it started a week ago and that it feels like her heart is racing and that theres fluid and a fluttering feeling     HPI: Julie Duffy 73 y.o. come in for sda  Sent in by nurse triage   PCP not available Palpitations and lower chest discomfort like fluid .  When takes a deep breath feels like fluid and flip flopping irreg HB feeling  At night worse  . Lasting  A  minute or so and no associated sx   Concern and worried over this  Bp: wrist monitor  Diastolic 95  /134  Pulse is 59 range .   Hx of h pylori rx . In fall 19  Also has    And constipation  And GI did tsh .  And was 13 range    So now taking med first in am  Without other food or meds  In past was taking whenever  But  Daily   Her med was in to 100 mcg in [place] after  tsh in 23 range  Exercise  No aggravation of sx  No sweating syncope a bit anxious    About this and hesitant to take alprazolam to not get dependent    ROS: See pertinent positives and negatives per HPI.  Past Medical History:  Diagnosis Date  . Allergy    seasonal  . Anxiety   . Constipation   . H. pylori infection   . Hypertension   . Hypothyroid   . Scoliosis     Family History  Problem Relation Age of Onset  . Cancer Mother 98       colon ca  . Heart disease Mother   . Colon cancer Mother   . Thyroid disease Mother   . Cancer Father   . Thyroid disease Daughter   . Esophageal cancer Neg Hx   . Stomach cancer Neg Hx     Social History   Socioeconomic History  . Marital status: Divorced    Spouse name: Not on file  . Number of children: 3  . Years of education: Not on file  . Highest education level: Not on file  Occupational History  . Occupation: Ambulance person: [employer]  Social Needs  . Financial resource strain: Not on file  . Food insecurity    Worry: Not on file    Inability: Not on file  . Transportation needs    Medical: Not on  file    Non-medical: Not on file  Tobacco Use  . Smoking status: Never Smoker  . Smokeless tobacco: Never Used  Substance and Sexual Activity  . Alcohol use: No    Alcohol/week: 0.0 standard drinks  . Drug use: No  . Sexual activity: Not on file  Lifestyle  . Physical activity    Days per week: Not on file    Minutes per session: Not on file  . Stress: Not on file  Relationships  . Social Musician on phone: Not on file    Gets together: Not on file    Attends religious service: Not on file    Active member of club or organization: Not on file    Attends meetings of clubs or organizations: Not on file    Relationship status: Not on file  Other Topics Concern  . Not on file  Social  History Narrative  . Not on file    Outpatient Medications Prior to Visit  Medication Sig Dispense Refill  . ALPRAZolam (XANAX) 0.5 MG tablet TAKE 1 TABLET (0.5 MG TOTAL) BY MOUTH 2 (TWO) TIMES DAILY. 30 tablet 0  . amLODipine (NORVASC) 5 MG tablet Take 1 tablet (5 mg total) by mouth daily. 90 tablet 3  . Calcium Carbonate Antacid (ANTACID PO) Take by mouth as needed.    Marland Kitchen. levothyroxine (SYNTHROID, LEVOTHROID) 100 MCG tablet Take 1 tablet (100 mcg total) by mouth daily. 90 tablet 3   No facility-administered medications prior to visit.      EXAM:  BP 134/78 (BP Location: Right Arm, Patient Position: Sitting, Cuff Size: Normal)   Pulse 63   Temp 97.9 F (36.6 C) (Temporal)   Wt 122 lb 12.8 oz (55.7 kg)   SpO2 99%   BMI 21.41 kg/m   Body mass index is 21.41 kg/m.  GENERAL: vitals reviewed and listed above, alert, oriented, appears well hydrated and in no acute distress HEENT: atraumatic, conjunctiva  clear, no obvious abnormalities on inspection of external nose and ears OP : masked  NECK: no obvious masses on inspection palpation  LUNGS: clear to auscultation bilaterally, no wheezes, rales or rhonchi, good air movement chest  Is scoliosis and  Pectus cavus  CV: HRRR, no  clubbing cyanosis or  peripheral edema nl cap refill  Abdomen:  Sof,t normal bowel sounds without hepatosplenomegaly, no guarding rebound or masses no CVA tenderness MS: moves all extremities without noticeable focal  abnormality PSYCH: pleasant and cooperative, no obvious depression or anxiety Lab Results  Component Value Date   WBC 7.3 05/07/2018   HGB 12.9 05/07/2018   HCT 39.2 05/07/2018   PLT 265 05/07/2018   GLUCOSE 100 (H) 05/07/2018   CHOL 239 (H) 10/02/2010   TRIG 91.0 10/02/2010   HDL 68.00 10/02/2010   LDLDIRECT 153.0 10/02/2010   ALT 20 05/07/2018   AST 23 05/07/2018   NA 141 05/07/2018   K 4.7 05/07/2018   CL 102 05/07/2018   CREATININE 1.01 (H) 05/07/2018   BUN 11 05/07/2018   CO2 30 05/07/2018   TSH 13.35 (H) 04/06/2019   BP Readings from Last 3 Encounters:  04/19/19 134/78  04/06/19 116/64  09/01/18 (!) 158/78   Wt Readings from Last 3 Encounters:  04/19/19 122 lb 12.8 oz (55.7 kg)  04/06/19 125 lb 4 oz (56.8 kg)  09/01/18 122 lb (55.3 kg)  ekg sinus rhytym r r1 no acute findings   ASSESSMENT AND PLAN:  Discussed the following assessment and plan:  Palpitations - Plan: EKG 12-Lead, ECHOCARDIOGRAM COMPLETE  Medication management - Plan: ECHOCARDIOGRAM COMPLETE  Hypothyroidism, unspecified type - Plan: ECHOCARDIOGRAM COMPLETE  Essential hypertension - Plan: ECHOCARDIOGRAM COMPLETE  History of Helicobacter pylori infection  Scoliosis, unspecified scoliosis type, unspecified spinal region  Disc eval  Chest discomfort may be gi  And disc if thyroid off then can cause many of her sx but at risk for other conditions.    fam hx of poss  afib mom  Disc  Reinforced  Thyroid med and absorption etc .  Will disc with dr Ardyth HarpsHernandez    If bp  Up consider metoprolol low dose   -Patient advised to return or notify health care team  if  new concerns arise.  Patient Instructions  ekg show no worrisome findings   Stay on  Thyroid med taking am without other  meds.   Will hand off to dr Ardyth HarpsHernandez  But would consider ordering  Echocardiogram and  Fu labs   Plan to be contacted   About next step   Your exam today is reassuring .   Thyroid  Off can cause  Palpitations  And may need to be adjusted  At some point.         Palpitations Palpitations are feelings that your heartbeat is irregular or is faster than normal. It may feel like your heart is fluttering or skipping a beat. Palpitations are usually not a serious problem. They may be caused by many things, including smoking, caffeine, alcohol, stress, and certain medicines or drugs. Most causes of palpitations are not serious. However, some palpitations can be a sign of a serious problem. You may need further tests to rule out serious medical problems. Follow these instructions at home:     Pay attention to any changes in your condition. Take these actions to help manage your symptoms: Eating and drinking  Avoid foods and drinks that may cause palpitations. These may include: ? Caffeinated coffee, tea, soft drinks, diet pills, and energy drinks. ? Chocolate. ? Alcohol. Lifestyle  Take steps to reduce your stress and anxiety. Things that can help you relax include: ? Yoga. ? Mind-body activities, such as deep breathing, meditation, or using words and images to create positive thoughts (guided imagery). ? Physical activity, such as swimming, jogging, or walking. Tell your health care provider if your palpitations increase with activity. If you have chest pain or shortness of breath with activity, do not continue the activity until you are seen by your health care provider. ? Biofeedback. This is a method that helps you learn to use your mind to control things in your body, such as your heartbeat.  Do not use drugs, including cocaine or ecstasy. Do not use marijuana.  Get plenty of rest and sleep. Keep a regular bed time. General instructions  Take over-the-counter and prescription  medicines only as told by your health care provider.  Do not use any products that contain nicotine or tobacco, such as cigarettes and e-cigarettes. If you need help quitting, ask your health care provider.  Keep all follow-up visits as told by your health care provider. This is important. These may include visits for further testing if palpitations do not go away or get worse. Contact a health care provider if you:  Continue to have a fast or irregular heartbeat after 24 hours.  Notice that your palpitations occur more often. Get help right away if you:  Have chest pain or shortness of breath.  Have a severe headache.  Feel dizzy or you faint. Summary  Palpitations are feelings that your heartbeat is irregular or is faster than normal. It may feel like your heart is fluttering or skipping a beat.  Palpitations may be caused by many things, including smoking, caffeine, alcohol, stress, certain medicines, and drugs.  Although most causes of palpitations are not serious, some causes can be a sign of a serious medical problem.  Get help right away if you faint or have chest pain, shortness of breath, a severe headache, or dizziness. This information is not intended to replace advice given to you by your health care provider. Make sure you discuss any questions you have with your health care provider. Document Released: 07/19/2000 Document Revised: 09/03/2017 Document Reviewed: 09/03/2017 Elsevier Patient Education  2020 [employer] Zafir Schauer M.D.

## 2019-04-21 NOTE — Telephone Encounter (Signed)
Appointment scheduled.

## 2019-04-27 ENCOUNTER — Other Ambulatory Visit: Payer: Self-pay

## 2019-04-27 ENCOUNTER — Ambulatory Visit (HOSPITAL_COMMUNITY): Payer: Medicare HMO | Attending: Cardiology

## 2019-04-27 DIAGNOSIS — Z79899 Other long term (current) drug therapy: Secondary | ICD-10-CM | POA: Diagnosis not present

## 2019-04-27 DIAGNOSIS — I10 Essential (primary) hypertension: Secondary | ICD-10-CM | POA: Insufficient documentation

## 2019-04-27 DIAGNOSIS — E039 Hypothyroidism, unspecified: Secondary | ICD-10-CM | POA: Insufficient documentation

## 2019-04-27 DIAGNOSIS — R002 Palpitations: Secondary | ICD-10-CM

## 2019-04-28 ENCOUNTER — Telehealth: Payer: Self-pay | Admitting: *Deleted

## 2019-04-28 NOTE — Telephone Encounter (Signed)
Left message on machine for patient.  Fasting not needed for office visit. CRM

## 2019-04-28 NOTE — Telephone Encounter (Signed)
Copied from [place] [id number]. Topic: General - Other >> Apr 26, 2019 12:46 PM Julie Duffy A wrote: Reason for CRM: Pt called and is requesting to know if she is needing to fast for her appt on 05/06/2019. Please advise.

## 2019-04-30 ENCOUNTER — Ambulatory Visit: Payer: Self-pay | Admitting: *Deleted

## 2019-04-30 NOTE — Telephone Encounter (Signed)
Pt called in for her echocardiogram result.   I read her the note from Dr. Regis Bill dated 04/28/2019 at 3:12 PM.    Echo looks good.  Dr. Regis Bill is sending the results to Dr. Jerilee Hoh for f/u as planned. Pt has an appt with Dr. Jerilee Hoh this Thursday per pt.    Pt had a question about her lab work that she is supposed to have done at the upcoming appt.    Does she need to fast?   She said someone in the office told her they thought she needed to fast.   I could not find anything in the notes indicating one way or the other.     She would like someone to please call her and clarify the lab work situation before her upcoming appt.   Thanks.  I sent these notes to Dr. Ledell Noss office.

## 2019-05-05 NOTE — Telephone Encounter (Signed)
Will discuss at office visit 05/06/2019.

## 2019-05-06 ENCOUNTER — Other Ambulatory Visit: Payer: Self-pay

## 2019-05-06 ENCOUNTER — Ambulatory Visit (INDEPENDENT_AMBULATORY_CARE_PROVIDER_SITE_OTHER): Payer: Medicare HMO | Admitting: Internal Medicine

## 2019-05-06 ENCOUNTER — Encounter: Payer: Self-pay | Admitting: Internal Medicine

## 2019-05-06 VITALS — BP 110/76 | HR 57 | Temp 97.7°F | Wt 128.0 lb

## 2019-05-06 DIAGNOSIS — E039 Hypothyroidism, unspecified: Secondary | ICD-10-CM | POA: Diagnosis not present

## 2019-05-06 DIAGNOSIS — I10 Essential (primary) hypertension: Secondary | ICD-10-CM | POA: Diagnosis not present

## 2019-05-06 DIAGNOSIS — R5383 Other fatigue: Secondary | ICD-10-CM

## 2019-05-06 NOTE — Progress Notes (Signed)
Established Patient Office Visit     CC/Reason for Visit: F/u thyroid issues  HPI: Julie Duffy is a 73 y.o. female who is coming in today for the above mentioned reasons. Past Medical History is significant for: Since I last saw her in office, she has had 2 visits with different providers for thyroid-related issues. She had a TSH at [facility] office that resulted at 13.35. Instead of increasing her medication, she opted for trying to take it more consistently at the same time every day as well as on an empty stomach. She has been doing this for 2 weeks. She then developed palpitations and saw another provider in the office who ordered a 2D ECHO that showed a normal EF but some diastolic dysfunction. She also has a multitude of another minor aches and pains. Has also been dealing with constipation, fatigue and stomach issues (GERD?) with GI. She is not on a PPI.   Past Medical/Surgical History: Past Medical History:  Diagnosis Date  . Allergy    seasonal  . Anxiety   . Constipation   . H. pylori infection   . Hypertension   . Hypothyroid   . Scoliosis     Past Surgical History:  Procedure Laterality Date  . APPENDECTOMY    . NASAL SINUS SURGERY    . NECK LESION BIOPSY     cysts benign  . TUBAL LIGATION    . UMBILICAL HERNIA REPAIR      Social History:  reports that she has never smoked. She has never used smokeless tobacco. She reports that she does not drink alcohol or use drugs.  Allergies: Allergies  Allergen Reactions  . Guaifenesin & Derivatives Nausea And Vomiting  . Clindamycin Other (See Comments)    GI intolerance  . Clindamycin/Lincomycin Cross Reactors Other (See Comments)    GI intolerance  . Codeine Other (See Comments)    hallucinations  . Sulfa Antibiotics Other (See Comments)    unspecified    Family History:  Family History  Problem Relation Age of Onset  . Cancer Mother 65       colon ca  . Heart disease Mother   . Colon cancer Mother    . Thyroid disease Mother   . Cancer Father   . Thyroid disease Daughter   . Esophageal cancer Neg Hx   . Stomach cancer Neg Hx      Current Outpatient Medications:  .  ALPRAZolam (XANAX) 0.5 MG tablet, TAKE 1 TABLET (0.5 MG TOTAL) BY MOUTH 2 (TWO) TIMES DAILY., Disp: 30 tablet, Rfl: 0 .  amLODipine (NORVASC) 5 MG tablet, Take 1 tablet (5 mg total) by mouth daily., Disp: 90 tablet, Rfl: 3 .  Calcium Carbonate Antacid (ANTACID PO), Take by mouth as needed., Disp: , Rfl:  .  levothyroxine (SYNTHROID, LEVOTHROID) 100 MCG tablet, Take 1 tablet (100 mcg total) by mouth daily., Disp: 90 tablet, Rfl: 3  Review of Systems:  Constitutional: Denies fever, chills, diaphoresis, appetite change. HEENT: Denies photophobia, eye pain, redness, hearing loss, ear pain, congestion, sore throat, rhinorrhea, sneezing, mouth sores, trouble swallowing, neck pain, neck stiffness and tinnitus.   Respiratory: Denies SOB, DOE, cough, chest tightness,  and wheezing.   Cardiovascular: Denies chest pain, palpitations and leg swelling.  Gastrointestinal: Denies vomiting,  diarrhea, constipation, blood in stool and abdominal distention.  Genitourinary: Denies dysuria, urgency, frequency, hematuria, flank pain and difficulty urinating.  Endocrine: Denies: hot or cold intolerance, sweats, changes in hair or nails, polyuria,  polydipsia. Musculoskeletal: Denies myalgias, back pain, joint swelling, arthralgias and gait problem.  Skin: Denies pallor, rash and wound.  Neurological: Denies dizziness, seizures, syncope, weakness, light-headedness, numbness and headaches.  Hematological: Denies adenopathy. Easy bruising, personal or family bleeding history  Psychiatric/Behavioral: Denies suicidal ideation, mood changes, confusion, nervousness, sleep disturbance and agitation    Physical Exam: Vitals:   05/06/19 1537  BP: 110/76  Pulse: (!) 57  Temp: 97.7 F (36.5 C)  TempSrc: Temporal  SpO2: 97%  Weight: 128 lb (58.1  kg)    Body mass index is 22.32 kg/m.   Constitutional: NAD, calm, comfortable Eyes: PERRL, lids and conjunctivae normal ENMT: Mucous membranes are moist.  Respiratory: clear to auscultation bilaterally, no wheezing, no crackles. Normal respiratory effort. No accessory muscle use.  Cardiovascular: Regular rate and rhythm, no murmurs / rubs / gallops. No extremity edema. 2+ pedal pulses. No carotid bruits.  Abdomen: no tenderness to palpation, no masses palpated. No hepatosplenomegaly. Bowel sounds positive.  Musculoskeletal: no clubbing / cyanosis. No joint deformity upper and lower extremities. Good ROM, no contractures. Normal muscle tone.  Skin: no rashes, lesions, ulcers. No induration Neurologic: grossly intact and non-focal Psychiatric: Normal judgment and insight. Alert and oriented x 3. Normal mood.    Impression and Plan:  Hypothyroidism, unspecified type  -She is hesitant to increase dose as she thinks her dose is too high (despite an elevated TSH). -Have explained negative feedback loop. -She would like to try a full 4 weeks of taking levothyroxine correctly and have labs rechecked (2 weeks from now). -She understands dose will need to be increased is TSH remains high.  Essential hypertension -Well controlled on current regimen.  Fatigue, unspecified type -Could be hypothyroidism. -With other multiple complaints, will also check Vit B12 and vit D.      Estela Philip Aspen, MD [facility] at [facility]

## 2019-05-24 ENCOUNTER — Other Ambulatory Visit (INDEPENDENT_AMBULATORY_CARE_PROVIDER_SITE_OTHER): Payer: Medicare HMO

## 2019-05-24 ENCOUNTER — Other Ambulatory Visit: Payer: Self-pay

## 2019-05-24 DIAGNOSIS — E039 Hypothyroidism, unspecified: Secondary | ICD-10-CM | POA: Diagnosis not present

## 2019-05-24 DIAGNOSIS — R5383 Other fatigue: Secondary | ICD-10-CM

## 2019-05-24 LAB — TSH: TSH: 1.63 u[IU]/mL (ref 0.35–4.50)

## 2019-05-24 LAB — VITAMIN B12: Vitamin B-12: 268 pg/mL (ref 211–911)

## 2019-05-24 LAB — VITAMIN D 25 HYDROXY (VIT D DEFICIENCY, FRACTURES): VITD: 40.04 ng/mL (ref 30.00–100.00)

## 2019-05-26 ENCOUNTER — Other Ambulatory Visit: Payer: Self-pay | Admitting: *Deleted

## 2019-05-26 MED ORDER — AMLODIPINE BESYLATE 5 MG PO TABS: 5.0000 mg | ORAL_TABLET | Freq: Every day | ORAL | 1 refills | Status: DC

## 2019-05-27 ENCOUNTER — Telehealth: Payer: Self-pay

## 2019-05-27 NOTE — Telephone Encounter (Signed)
Pt calling again to get results.  States she needs these before she can order her next prescription refill.

## 2019-05-27 NOTE — Telephone Encounter (Signed)
Copied from [place] [id number]. Topic: General - Other >> May 27, 2019 11:48 AM Keene Breath wrote: Reason for CRM: Patient called to find out the results of her labs.  Please call at [phone]

## 2019-05-28 NOTE — Telephone Encounter (Signed)
Patient is aware of lab results. See lab result note. 

## 2019-06-01 ENCOUNTER — Telehealth: Payer: Self-pay | Admitting: Internal Medicine

## 2019-06-01 ENCOUNTER — Other Ambulatory Visit: Payer: Self-pay | Admitting: Internal Medicine

## 2019-06-01 DIAGNOSIS — Z111 Encounter for screening for respiratory tuberculosis: Secondary | ICD-10-CM

## 2019-06-01 NOTE — Telephone Encounter (Signed)
Patient inquired if she can have an order to get chest x-ray as part of TB screening required for her employer.

## 2019-06-01 NOTE — Telephone Encounter (Signed)
First line of screening is PPD or a quantiferon gold. Has she had that first? CXR only if one of those is positive.

## 2019-06-02 NOTE — Telephone Encounter (Signed)
Spoke with patient and lab ordered. Patient will call back to schedule a lab appointment.

## 2019-06-15 ENCOUNTER — Ambulatory Visit: Payer: Self-pay | Admitting: *Deleted

## 2019-06-15 NOTE — Telephone Encounter (Signed)
In office appointment scheduled.

## 2019-06-15 NOTE — Telephone Encounter (Signed)
Patient is reporting symptoms for 2 months. Patient is concerned this is related to BP/medication. Patient had stopped medication- she restarted and is now having symptoms. Patient states she feels the palpitations at night - but they are happening more frequently since she restarted BP medication. Patient has also had adjustment in thyroid medication and states that did not help symptom. Per protocol- patient needs to be seen in 4 hours- call to office- there are no appointment times open with PCP- request note for review and possible work in with PCP. Patient advised if she starts having symptoms in the day- go to ED- if she does not get call back- call office for follow up- patient states she is off work at 3 pm today.  Reason for Disposition . Age > 60 years (Exception: brief heart beat symptoms that went away and now feels well)  Answer Assessment - Initial Assessment Questions 1. DESCRIPTION: "Please describe your heart rate or heart beat that you are having" (e.g., fast/slow, regular/irregular, skipped or extra beats, "palpitations")     Palpations 2. ONSET: "When did it start?" (Minutes, hours or days)      2 months- patient thinks it is related to medication 3. DURATION: "How long does it last" (e.g., seconds, minutes, hours)     Mostly in the evening- patient notices- hours 4. PATTERN "Does it come and go, or has it been constant since it started?"  "Does it get worse with exertion?"   "Are you feeling it now?"     Constant, not worse with exertion, not feeling them strongly now 5. TAP: "Using your hand, can you tap out what you are feeling on a chair or table in front of you, so that I can hear?" (Note: not all patients can do this)       Irregular heart beats and then feels in the throat 6. HEART RATE: "Can you tell me your heart rate?" "How many beats in 15 seconds?"  (Note: not all patients can do this)       Patient is at work- can not check 7. RECURRENT SYMPTOM: "Have you ever had  this before?" If so, ask: "When was the last time?" and "What happened that time?"      Not this bad- mostly occurring in the middle of the night- no not in the past 8. CAUSE: "What do you think is causing the palpitations?"     Medication for BP 9. CARDIAC HISTORY: "Do you have any history of heart disease?" (e.g., heart attack, angina, bypass surgery, angioplasty, arrhythmia)      no 10. OTHER SYMPTOMS: "Do you have any other symptoms?" (e.g., dizziness, chest pain, sweating, difficulty breathing)       No- other than feeling like can't catch breath when occurring 11. PREGNANCY: "Is there any chance you are pregnant?" "When was your last menstrual period?"       n/a  Protocols used: HEART RATE AND HEARTBEAT QUESTIONS-A-AH

## 2019-06-16 ENCOUNTER — Encounter: Payer: Self-pay | Admitting: Family Medicine

## 2019-06-16 ENCOUNTER — Other Ambulatory Visit: Payer: Self-pay

## 2019-06-16 ENCOUNTER — Ambulatory Visit (INDEPENDENT_AMBULATORY_CARE_PROVIDER_SITE_OTHER): Payer: Medicare HMO | Admitting: Family Medicine

## 2019-06-16 VITALS — BP 110/70 | HR 54 | Temp 97.6°F | Ht 63.5 in | Wt 126.9 lb

## 2019-06-16 DIAGNOSIS — E039 Hypothyroidism, unspecified: Secondary | ICD-10-CM | POA: Diagnosis not present

## 2019-06-16 DIAGNOSIS — I10 Essential (primary) hypertension: Secondary | ICD-10-CM | POA: Diagnosis not present

## 2019-06-16 DIAGNOSIS — R002 Palpitations: Secondary | ICD-10-CM | POA: Diagnosis not present

## 2019-06-16 DIAGNOSIS — Z111 Encounter for screening for respiratory tuberculosis: Secondary | ICD-10-CM

## 2019-06-16 MED ORDER — LOSARTAN POTASSIUM 50 MG PO TABS: 50.0000 mg | ORAL_TABLET | Freq: Every day | ORAL | 3 refills | Status: DC

## 2019-06-16 NOTE — Progress Notes (Signed)
Subjective:     Patient ID: Julie Duffy, female   DOB: 1945/09/09, 73 y.o.   MRN: [id number]  HPI Patient is here with concerns for some recent elevated blood pressures.  She takes amlodipine 5 mg daily.  She has a wrist monitor and had several home readings 150s/90s.  She has never had her cuff compared with any other standards.  She is not sure how accurate.  She states that she has had some recent increased palpitations.  These tend occur more at night.  No exercise intolerance.  In fact, she has not had any symptoms of palpitation with exercise.  No syncope.  No chest pain.  She is on thyroid replacement and had normal TSH 3 weeks ago.  She had EKG back in September that was unremarkable.  Also had echocardiogram then which was unremarkable.  Good ejection fraction.  She has questions regarding whether her amlodipine may be worsening her palpitations.  No caffeine use.  No alcohol use.  Patient states her work is requiring chest x-ray every 2 years for TB screening.  She states she had some type of reaction with PPD in the past and cannot take that.  We discussed QuantiFERON gold screening but her lab is currently closed and she prefers to return for x-ray  Past Medical History:  Diagnosis Date  . Allergy    seasonal  . Anxiety   . Constipation   . H. pylori infection   . Hypertension   . Hypothyroid   . Scoliosis    Past Surgical History:  Procedure Laterality Date  . APPENDECTOMY    . NASAL SINUS SURGERY    . NECK LESION BIOPSY     cysts benign  . TUBAL LIGATION    . UMBILICAL HERNIA REPAIR      reports that she has never smoked. She has never used smokeless tobacco. She reports that she does not drink alcohol or use drugs. family history includes Cancer in her father; Cancer (age of onset: 9) in her mother; Colon cancer in her mother; Heart disease in her mother; Thyroid disease in her daughter and mother. Allergies  Allergen Reactions  . Guaifenesin & Derivatives  Nausea And Vomiting  . Clindamycin Other (See Comments)    GI intolerance  . Clindamycin/Lincomycin Cross Reactors Other (See Comments)    GI intolerance  . Codeine Other (See Comments)    hallucinations  . Sulfa Antibiotics Other (See Comments)    unspecified     Review of Systems  Constitutional: Negative for appetite change, fatigue and unexpected weight change.  Eyes: Negative for visual disturbance.  Respiratory: Negative for cough, chest tightness, shortness of breath and wheezing.   Cardiovascular: Positive for palpitations. Negative for chest pain and leg swelling.  Neurological: Negative for dizziness, seizures, syncope, weakness, light-headedness and headaches.       Objective:   Physical Exam Constitutional:      Appearance: She is well-developed.  Eyes:     Pupils: Pupils are equal, round, and reactive to light.  Neck:     Musculoskeletal: Neck supple.     Thyroid: No thyromegaly.     Vascular: No JVD.  Cardiovascular:     Rate and Rhythm: Normal rate and regular rhythm.     Heart sounds: No murmur. No gallop.   Pulmonary:     Effort: Pulmonary effort is normal. No respiratory distress.     Breath sounds: Normal breath sounds. No wheezing or rales.  Neurological:  Mental Status: She is alert.        Assessment:     #1 hypertension well controlled by readings today but poorly controlled by home readings.  Question accuracy of wrist cuff at home  #2 frequent palpitations.  Probably likely PVCs or PACs.  We elected not to get EKG repeat today since she had one recently and has very normal exam at this time    Plan:     -Discussed change from amlodipine to losartan 50 mg daily and recommend follow-up with her primary in a few weeks to reassess --We discussed possible event monitor if symptoms persist with change in medication.  We also discussed possible beta-blocker use but she already has pulse fairly low at 54 -Continue to avoid caffeine. -We placed  order for her to return for chest x-ray as required by her work for TB screening purposes  Eulas Post MD [facility] at [facility]

## 2019-06-16 NOTE — Patient Instructions (Signed)
Stop the Amlodipine  Start Losartan 50 mg once daily  Set up follow up with Dr Jerilee Hoh in about 4 weeks.

## 2019-06-18 ENCOUNTER — Other Ambulatory Visit: Payer: Self-pay

## 2019-06-18 ENCOUNTER — Other Ambulatory Visit (INDEPENDENT_AMBULATORY_CARE_PROVIDER_SITE_OTHER): Payer: Medicare HMO

## 2019-06-18 DIAGNOSIS — Z111 Encounter for screening for respiratory tuberculosis: Secondary | ICD-10-CM | POA: Diagnosis not present

## 2019-06-20 LAB — QUANTIFERON-TB GOLD PLUS
Mitogen-NIL: 9.59 IU/mL
NIL: 0.03 IU/mL
QuantiFERON-TB Gold Plus: NEGATIVE
TB1-NIL: 0.03 IU/mL
TB2-NIL: 0.04 IU/mL

## 2019-06-23 ENCOUNTER — Telehealth: Payer: Self-pay

## 2019-06-23 NOTE — Telephone Encounter (Signed)
Copy ready to pick up and patient is aware

## 2019-06-23 NOTE — Telephone Encounter (Signed)
Copied from [place] [phone]. Topic: Quick Communication - Lab Results (Clinic Use ONLY) >> Jun 23, 2019 11:01 AM Lamarr Lulas, CMA wrote: Called patient to inform them of  lab results. When patient returns call, triage nurse may disclose results. Okay for triage nurse to speak with patient and give recommendations  ' >> Jun 23, 2019  3:20 PM Reyne Dumas L wrote: Pt calling because she needs a printed copy of her TB results to take to her employer.

## 2019-08-11 ENCOUNTER — Other Ambulatory Visit: Payer: Self-pay | Admitting: Internal Medicine

## 2019-09-04 ENCOUNTER — Other Ambulatory Visit: Payer: Self-pay | Admitting: Internal Medicine

## 2019-09-04 DIAGNOSIS — E039 Hypothyroidism, unspecified: Secondary | ICD-10-CM

## 2019-09-06 DIAGNOSIS — U071 COVID-19: Secondary | ICD-10-CM

## 2019-09-06 HISTORY — DX: COVID-19: U07.1

## 2019-09-07 ENCOUNTER — Telehealth: Payer: Self-pay | Admitting: Internal Medicine

## 2019-09-07 DIAGNOSIS — E039 Hypothyroidism, unspecified: Secondary | ICD-10-CM

## 2019-09-07 MED ORDER — LEVOTHYROXINE SODIUM 100 MCG PO TABS: 100.0000 ug | ORAL_TABLET | Freq: Every day | ORAL | 0 refills | Status: DC

## 2019-09-07 NOTE — Telephone Encounter (Signed)
Refill sent.

## 2019-09-07 NOTE — Telephone Encounter (Signed)
Mediation refill: Levothyroxine  Pharmacy: [facility] [address] Pt can be reached at [phone]

## 2019-09-12 DIAGNOSIS — Z20828 Contact with and (suspected) exposure to other viral communicable diseases: Secondary | ICD-10-CM | POA: Diagnosis not present

## 2019-09-14 ENCOUNTER — Telehealth: Payer: Self-pay | Admitting: Internal Medicine

## 2019-09-14 NOTE — Telephone Encounter (Signed)
Pt was tested on Sunday for COVID and was told today she is positive for COVID. Pt has a lot of congestion and would like to know if any medication can be called in to the pharmacy or if she can take any over the counter medication. Thanks  [facility] on [address])

## 2019-09-15 ENCOUNTER — Other Ambulatory Visit: Payer: Self-pay | Admitting: Family Medicine

## 2019-09-15 ENCOUNTER — Telehealth: Payer: Self-pay | Admitting: Internal Medicine

## 2019-09-15 NOTE — Telephone Encounter (Signed)
Pt wants to know what kind on medicine she can take she has covid and congestion.   Patient Phone: [phone]

## 2019-09-15 NOTE — Telephone Encounter (Signed)
Left message on machine for patient to return our call 

## 2019-09-15 NOTE — Telephone Encounter (Signed)
She can try mucinex and tylenol sinus. Offer virtual visit.

## 2019-09-15 NOTE — Telephone Encounter (Signed)
Patient is aware 

## 2019-09-15 NOTE — Telephone Encounter (Signed)
I believe I answered this already on another note. We can also offer her a virtual appointment.

## 2019-09-20 ENCOUNTER — Telehealth: Payer: Self-pay | Admitting: Internal Medicine

## 2019-09-20 NOTE — Telephone Encounter (Signed)
Pt wanted to update Julie Duffy on her symptoms. Pt thought she was doing better since being positive with COVID. She feels weaker and like she is going to pass out. BP is fine. Ears ring. Sinus pressure still going on. She is not taking any medicine through the day and sometimes at night she will take a tylenol.  Pt can be reached at [phone]   Informed pt that Julie Duffy is out of office for the rest of this month and most of March

## 2019-09-20 NOTE — Telephone Encounter (Signed)
I called the pt and advised she go to the ER due to feeling faint.  Patient was tearful, stated she was feeling better and stated now she feels so bad.  Patient asked if there was a hospital for COVID patients and  I advised the former [facility] is used for these patients but in her case she should go to the nearest ER close to her home and she agreed.

## 2019-09-21 NOTE — Telephone Encounter (Signed)
Spoke with patient and she did not go to the ER.  She spoke with a pharmacist and is trying OTC. She states she "is feeling a little better and stronger".  She will call back as needed.

## 2019-09-28 ENCOUNTER — Other Ambulatory Visit: Payer: Self-pay

## 2019-09-28 ENCOUNTER — Telehealth (INDEPENDENT_AMBULATORY_CARE_PROVIDER_SITE_OTHER): Payer: Medicare HMO | Admitting: Family

## 2019-09-28 ENCOUNTER — Other Ambulatory Visit: Payer: Self-pay | Admitting: Internal Medicine

## 2019-09-28 DIAGNOSIS — E039 Hypothyroidism, unspecified: Secondary | ICD-10-CM

## 2019-09-28 DIAGNOSIS — I10 Essential (primary) hypertension: Secondary | ICD-10-CM | POA: Diagnosis not present

## 2019-09-28 DIAGNOSIS — K219 Gastro-esophageal reflux disease without esophagitis: Secondary | ICD-10-CM | POA: Diagnosis not present

## 2019-09-28 LAB — BASIC METABOLIC PANEL
BUN: 10 mg/dL (ref 6–23)
CO2: 31 mEq/L (ref 19–32)
Calcium: 10.1 mg/dL (ref 8.4–10.5)
Chloride: 102 mEq/L (ref 96–112)
Creatinine, Ser: 0.86 mg/dL (ref 0.40–1.20)
GFR: 64.4 mL/min (ref >60.00)
Glucose, Bld: 93 mg/dL (ref 70–99)
Potassium: 4.6 mEq/L (ref 3.5–5.1)
Sodium: 138 mEq/L (ref 135–145)

## 2019-09-28 LAB — TSH: TSH: 1.8 u[IU]/mL (ref 0.35–4.50)

## 2019-09-28 MED ORDER — OMEPRAZOLE 20 MG PO CPDR: 20.0000 mg | DELAYED_RELEASE_CAPSULE | Freq: Every day | ORAL | 3 refills | Status: DC

## 2019-09-28 MED ORDER — ALPRAZOLAM 0.5 MG PO TABS: 0.5000 mg | ORAL_TABLET | Freq: Two times a day (BID) | ORAL | 0 refills | Status: DC

## 2019-09-28 NOTE — Patient Instructions (Signed)

## 2019-09-28 NOTE — Progress Notes (Signed)
I connected with  Julie Duffy on 09/28/19 by a video enabled telemedicine application and verified that I am speaking with the correct person using two identifiers.  S: 74 year old female with a history of Hypertension and  Hypothyroidism. She reports recovering from COVID-19 2 weeks ago. She continues to have fatigued. She reports having burning in her chest and reflux symptoms. She has a history of GERD in the past but has not had issues until 4 days ago.She took Mylanta that helped but caused diarrhea. Therefore, she discontinued the medication. She tolerates medications well. Was increased to 100 mcg of Synthroid at her last OV and has tolerated it well.   ROS: Denies chest pain, SOB, nausea or vomiting. Has mild fatigue that she related to recovering from COVID. Better overall.    Past Medical History:  Diagnosis Date   Allergy    seasonal   Anxiety    Constipation    H. pylori infection    Hypertension    Hypothyroid    Scoliosis     Social History   Socioeconomic History   Marital status: Divorced    Spouse name: Not on file   Number of children: 3   Years of education: Not on file   Highest education level: Not on file  Occupational History   Occupation: CMA    Employer: [employer]  Tobacco Use   Smoking status: Never Smoker   Smokeless tobacco: Never Used  Substance and Sexual Activity   Alcohol use: No    Alcohol/week: 0.0 standard drinks   Drug use: No   Sexual activity: Not on file  Other Topics Concern   Not on file  Social History Narrative   Not on file   Social Determinants of Health   Financial Resource Strain:    Difficulty of Paying Living Expenses: Not on file  Food Insecurity:    Worried About Programme researcher, broadcasting/film/video in the Last Year: Not on file   [employer] of Food in the Last Year: Not on file  Transportation Needs:    Lack of Transportation (Medical): Not on file   Lack of Transportation (Non-Medical): Not on  file  Physical Activity:    Days of Exercise per Week: Not on file   Minutes of Exercise per Session: Not on file  Stress:    Feeling of Stress : Not on file  Social Connections:    Frequency of Communication with Friends and Family: Not on file   Frequency of Social Gatherings with Friends and Family: Not on file   Attends Religious Services: Not on file   Active Member of Clubs or Organizations: Not on file   Attends Banker Meetings: Not on file   Marital Status: Not on file  Intimate Partner Violence:    Fear of Current or Ex-Partner: Not on file   Emotionally Abused: Not on file   Physically Abused: Not on file   Sexually Abused: Not on file    Past Surgical History:  Procedure Laterality Date   APPENDECTOMY     NASAL SINUS SURGERY     NECK LESION BIOPSY     cysts benign   TUBAL LIGATION     UMBILICAL HERNIA REPAIR      Family History  Problem Relation Age of Onset   Cancer Mother 80       colon ca   Heart disease Mother    Colon cancer Mother    Thyroid disease  Mother    Cancer Father    Thyroid disease Daughter    Esophageal cancer Neg Hx    Stomach cancer Neg Hx     Allergies  Allergen Reactions   Guaifenesin & Derivatives Nausea And Vomiting   Clindamycin Other (See Comments)    GI intolerance   Clindamycin/Lincomycin Cross Reactors Other (See Comments)    GI intolerance   Codeine Other (See Comments)    hallucinations   Sulfa Antibiotics Other (See Comments)    unspecified    Current Outpatient Medications on File Prior to Visit  Medication Sig Dispense Refill   ALPRAZolam (XANAX) 0.5 MG tablet TAKE 1 TABLET BY MOUTH 2 TIMES DAILY. 30 tablet 0   Calcium Carbonate Antacid (ANTACID PO) Take by mouth as needed.     levothyroxine (SYNTHROID) 100 MCG tablet Take 1 tablet (100 mcg total) by mouth daily. Please schedule an office visit for more refills. Will need thyroid levels checked.  thanks 90 tablet 0    losartan (COZAAR) 50 MG tablet TAKE 1 TABLET BY MOUTH EVERY DAY 90 tablet 1   No current facility-administered medications on file prior to visit.    There were no vitals taken for this visit.chart  O: A&O, NAD  A: Diagnoses and all orders for this visit:  Hypertension, unspecified type -     Basic Metabolic Panel  Hypothyroidism, unspecified type -     TSH -     Basic Metabolic Panel  Gastroesophageal reflux disease without esophagitis -     Basic Metabolic Panel  Other orders -     omeprazole (PRILOSEC) 20 MG capsule; Take 1 capsule (20 mg total) by mouth daily. -     ALPRAZolam (XANAX) 0.5 MG tablet; Take 1 tablet (0.5 mg total) by mouth 2 (two) times daily.   Plan: Avoids foods high in acid such as tomatoes citrus juices, and spicy foods.  Avoid eating within two hours of lying down or before exercising.  Do not overheat.  Try smaller more frequent meals.  If symptoms persist, elevate the head of her bed 12 inches while sleeping.Call if symptoms worsen or persist  Will Follow-up pending labs. Continue current medication.     I discussed the limitations of evaluation and management by telemedicine. The patient expressed understanding and agreed to proceed.

## 2019-09-30 ENCOUNTER — Telehealth: Payer: Self-pay | Admitting: Internal Medicine

## 2019-09-30 NOTE — Telephone Encounter (Signed)
Left message on machine returning her call. More information needed.

## 2019-09-30 NOTE — Telephone Encounter (Signed)
Pt is requesting to speak to a nurse regarding her blood pressure. Per pt, she has been having issues with her blood pressure. Pt stated, she has taken her blood pressure medication and feels a little better. I offered a few appointments and she declined and asked, if I can send a message to you. Thanks

## 2019-10-04 ENCOUNTER — Telehealth: Payer: Self-pay | Admitting: Internal Medicine

## 2019-10-04 NOTE — Telephone Encounter (Signed)
Pt would like a call back to go over results from lab work she had done on 09/28/19

## 2019-10-05 ENCOUNTER — Telehealth: Payer: Self-pay | Admitting: Internal Medicine

## 2019-10-05 NOTE — Telephone Encounter (Signed)
Reviewed lab results with patient.

## 2019-10-05 NOTE — Telephone Encounter (Signed)
Pt is returning your call about her labs result and want a call back. 

## 2019-10-06 NOTE — Telephone Encounter (Signed)
Spoke with patient and an appointment scheduled 

## 2019-10-08 ENCOUNTER — Ambulatory Visit (INDEPENDENT_AMBULATORY_CARE_PROVIDER_SITE_OTHER): Payer: Medicare HMO | Admitting: Family Medicine

## 2019-10-08 ENCOUNTER — Encounter: Payer: Self-pay | Admitting: Family Medicine

## 2019-10-08 ENCOUNTER — Other Ambulatory Visit: Payer: Self-pay

## 2019-10-08 VITALS — BP 128/78 | Temp 97.1°F | Ht 63.5 in | Wt 125.1 lb

## 2019-10-08 DIAGNOSIS — K219 Gastro-esophageal reflux disease without esophagitis: Secondary | ICD-10-CM | POA: Diagnosis not present

## 2019-10-08 DIAGNOSIS — I10 Essential (primary) hypertension: Secondary | ICD-10-CM | POA: Diagnosis not present

## 2019-10-08 NOTE — Patient Instructions (Signed)
Food Choices for Gastroesophageal Reflux Disease, Adult When you have gastroesophageal reflux disease (GERD), the foods you eat and your eating habits are very important. Choosing the right foods can help ease the discomfort of GERD. Consider working with a diet and nutrition specialist (dietitian) to help you make healthy food choices. What general guidelines should I follow?  Eating plan  Choose healthy foods low in fat, such as fruits, vegetables, whole grains, low-fat dairy products, and lean meat, fish, and poultry.  Eat frequent, small meals instead of three large meals each day. Eat your meals slowly, in a relaxed setting. Avoid bending over or lying down until 2-3 hours after eating.  Limit high-fat foods such as fatty meats or fried foods.  Limit your intake of oils, butter, and shortening to less than 8 teaspoons each day.  Avoid the following: ? Foods that cause symptoms. These may be different for different people. Keep a food diary to keep track of foods that cause symptoms. ? Alcohol. ? Drinking large amounts of liquid with meals. ? Eating meals during the 2-3 hours before bed.  Cook foods using methods other than frying. This may include baking, grilling, or broiling. Lifestyle  Maintain a healthy weight. Ask your health care provider what weight is healthy for you. If you need to lose weight, work with your health care provider to do so safely.  Exercise for at least 30 minutes on 5 or more days each week, or as told by your health care provider.  Avoid wearing clothes that fit tightly around your waist and chest.  Do not use any products that contain nicotine or tobacco, such as cigarettes and e-cigarettes. If you need help quitting, ask your health care provider.  Sleep with the head of your bed raised. Use a wedge under the mattress or blocks under the bed frame to raise the head of the bed. What foods are not recommended? The items listed may not be a complete  list. Talk with your dietitian about what dietary choices are best for you. Grains Pastries or quick breads with added fat. French toast. Vegetables Deep fried vegetables. French fries. Any vegetables prepared with added fat. Any vegetables that cause symptoms. For some people this may include tomatoes and tomato products, chili peppers, onions and garlic, and horseradish. Fruits Any fruits prepared with added fat. Any fruits that cause symptoms. For some people this may include citrus fruits, such as oranges, grapefruit, pineapple, and lemons. Meats and other protein foods High-fat meats, such as fatty beef or pork, hot dogs, ribs, ham, sausage, salami and bacon. Fried meat or protein, including fried fish and fried chicken. Nuts and nut butters. Dairy Whole milk and chocolate milk. Sour cream. Cream. Ice cream. Cream cheese. Milk shakes. Beverages Coffee and tea, with or without caffeine. Carbonated beverages. Sodas. Energy drinks. Fruit juice made with acidic fruits (such as orange or grapefruit). Tomato juice. Alcoholic drinks. Fats and oils Butter. Margarine. Shortening. Ghee. Sweets and desserts Chocolate and cocoa. Donuts. Seasoning and other foods Pepper. Peppermint and spearmint. Any condiments, herbs, or seasonings that cause symptoms. For some people, this may include curry, hot sauce, or vinegar-based salad dressings. Summary  When you have gastroesophageal reflux disease (GERD), food and lifestyle choices are very important to help ease the discomfort of GERD.  Eat frequent, small meals instead of three large meals each day. Eat your meals slowly, in a relaxed setting. Avoid bending over or lying down until 2-3 hours after eating.  Limit high-fat   foods such as fatty meat or fried foods. This information is not intended to replace advice given to you by your health care provider. Make sure you discuss any questions you have with your health care provider. Document Revised:  11/12/2018 Document Reviewed: 07/23/2016 Elsevier Patient Education  2020 [employer].  Consider getting back on Pepcid 20 mg twice daily as needed for reflux symptoms.

## 2019-10-08 NOTE — Progress Notes (Signed)
Subjective:     Patient ID: Julie Duffy, female   DOB: Dec 28, 1945, 74 y.o.   MRN: [id number]  HPI   Ms. Domke came in to discuss blood pressure.  She had been switched from amlodipine to losartan last fall but she states the losartan made her "sick ".  She is back on amlodipine but sometimes only taking half dose.  She states her blood pressure has been well controlled in the morning but up later in the day.  She states she had Covid infection back in early February.  She had some increase in GERD symptoms since that time.  She has been taking Gaviscon intermittently.  She does not use any alcohol.  No recent dietary changes.  No regular nonsteroidal use.  She does have some intermittent palpitations.  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  No chest pains.  Past Medical History:  Diagnosis Date  . Allergy    seasonal  . Anxiety   . Constipation   . H. pylori infection   . Hypertension   . Hypothyroid   . Scoliosis    Past Surgical History:  Procedure Laterality Date  . APPENDECTOMY    . NASAL SINUS SURGERY    . NECK LESION BIOPSY     cysts benign  . TUBAL LIGATION    . UMBILICAL HERNIA REPAIR      reports that she has never smoked. She has never used smokeless tobacco. She reports that she does not drink alcohol or use drugs. family history includes Cancer in her father; Cancer (age of onset: 80) in her mother; Colon cancer in her mother; Heart disease in her mother; Thyroid disease in her daughter and mother. Allergies  Allergen Reactions  . Guaifenesin & Derivatives Nausea And Vomiting  . Clindamycin Other (See Comments)    GI intolerance  . Clindamycin/Lincomycin Cross Reactors Other (See Comments)    GI intolerance  . Codeine Other (See Comments)    hallucinations  . Sulfa Antibiotics Other (See Comments)    unspecified     Review of Systems  Constitutional: Negative for fatigue and unexpected weight change.  Eyes: Negative for visual disturbance.  Respiratory:  Negative for cough, chest tightness, shortness of breath and wheezing.   Cardiovascular: Positive for palpitations. Negative for chest pain and leg swelling.  Neurological: Negative for dizziness, seizures, syncope, weakness, light-headedness and headaches.       Objective:   Physical Exam Constitutional:      Appearance: She is well-developed.  Eyes:     Pupils: Pupils are equal, round, and reactive to light.  Neck:     Thyroid: No thyromegaly.     Vascular: No JVD.  Cardiovascular:     Rate and Rhythm: Normal rate and regular rhythm.     Heart sounds: No gallop.   Pulmonary:     Effort: Pulmonary effort is normal. No respiratory distress.     Breath sounds: Normal breath sounds. No wheezing or rales.  Musculoskeletal:     Cervical back: Neck supple.  Neurological:     Mental Status: She is alert.        Assessment:     #1 hypertension well controlled by reading today.  She has had some sporadic elevations at home with wrist monitor  #2 intermittent GERD symptoms    Plan:     -Handout on GERD given -Recommend over-the-counter Pepcid 20 mg twice daily as needed and if not controlled with that she may try omeprazole 20 mg daily -  We recommended that she bring her cuff in to compare with ours at some point  Kristian Covey MD [facility]

## 2019-10-15 ENCOUNTER — Other Ambulatory Visit: Payer: Self-pay

## 2019-10-15 ENCOUNTER — Ambulatory Visit: Payer: Medicare HMO | Admitting: Gastroenterology

## 2019-10-15 ENCOUNTER — Other Ambulatory Visit: Payer: Medicare HMO

## 2019-10-15 ENCOUNTER — Encounter: Payer: Self-pay | Admitting: Gastroenterology

## 2019-10-15 DIAGNOSIS — R1013 Epigastric pain: Secondary | ICD-10-CM | POA: Diagnosis not present

## 2019-10-15 DIAGNOSIS — K219 Gastro-esophageal reflux disease without esophagitis: Secondary | ICD-10-CM

## 2019-10-15 DIAGNOSIS — R110 Nausea: Secondary | ICD-10-CM | POA: Diagnosis not present

## 2019-10-15 MED ORDER — OMEPRAZOLE 40 MG PO CPDR: DELAYED_RELEASE_CAPSULE | ORAL | 6 refills | Status: DC

## 2019-10-15 MED ORDER — FAMOTIDINE 20 MG PO TABS: 20.0000 mg | ORAL_TABLET | Freq: Every day | ORAL | Status: DC

## 2019-10-15 NOTE — Progress Notes (Signed)
Review of pertinent gastrointestinal problems: 1.Abdominal pain, nausea. Found to have H. Pylori + gastritis:12/30/2017 EGD with gastritis. Biopsies were positive for H. pylori. She was called in a 14-day course of Pylera.Later changed to triple therapy with Amoxicillin,Clindamycin andOmeprazole.CT scan abdomen pelvis with IV contrast and oral contrast October 2019 showed moderate volume of stool, otherwise normal. November 2019 H. pylori stool testing was negative. 2. FH of colon cancer(mother in her 89s): Colonoscopy 05/2017 No polyps, recall recommended at 5 years.  HPI: This is a pleasant 74 year old woman  She was last here in our office about 6 months ago and she saw Amy.  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  She was recommended to start align probiotic once daily, to add Benefiber to her regimen every day and started a trial of Linzess 145 mcg samples were given.  She was asked to call back with her response but we have not heard from her since.  Blood work February 2021 shows normal basic metabolic profile, normal TSH  She had Covid about 6 weeks ago.  She had upper respiratory symptoms but not severe.  Shortly after her Covid she began to have a lot of nausea, heartburn, abdominal pains.  These have been bothering her ever since.  She takes a Pepcid 20 mg around lunchtime every day.  She does not take proton pump inhibitors.  She takes Gaviscon periodically.  She has had no dysphagia, no overt GI bleeding.  Her weight has been overall stable.  She has had no vomiting.  ROS: complete GI ROS as described in HPI, all other review negative.  Constitutional:  No unintentional weight loss   Past Medical History:  Diagnosis Date  . Allergy    seasonal  . Anxiety   . Constipation   . COVID-19 virus infection   . H. pylori infection   . Hypertension   . Hypothyroid   . Scoliosis     Past Surgical History:  Procedure Laterality Date   . APPENDECTOMY    . NASAL SINUS SURGERY    . NECK LESION BIOPSY     cysts benign  . TUBAL LIGATION    . UMBILICAL HERNIA REPAIR      Current Outpatient Medications  Medication Sig Dispense Refill  . ALPRAZolam (XANAX) 0.5 MG tablet TAKE 1 TABLET BY MOUTH TWICE A DAY 30 tablet 1  . amLODipine (NORVASC) 5 MG tablet Take 5 mg by mouth daily.    . Calcium Carbonate Antacid (ANTACID PO) Take by mouth as needed.    . Famotidine (PEPCID AC PO) Take 1 tablet by mouth daily.    Marland Kitchen levothyroxine (SYNTHROID) 100 MCG tablet Take 1 tablet (100 mcg total) by mouth daily. Please schedule an office visit for more refills. Will need thyroid levels checked.  thanks 90 tablet 0   No current facility-administered medications for this visit.    Allergies as of 10/15/2019 - Review Complete 10/15/2019  Allergen Reaction Noted  . Guaifenesin & derivatives Nausea And Vomiting 09/23/2012  . Clindamycin Other (See Comments) 02/27/2015  . Clindamycin/lincomycin cross reactors Other (See Comments) 10/08/2010  . Codeine Other (See Comments)   . Sulfa antibiotics Other (See Comments)     Family History  Problem Relation Age of Onset  . Cancer Mother 50       colon ca  . Heart disease Mother   . Colon cancer Mother   . Thyroid disease Mother   . Cancer Father   . Thyroid  disease Daughter   . Esophageal cancer Neg Hx   . Stomach cancer Neg Hx     Social History   Socioeconomic History  . Marital status: Divorced    Spouse name: Not on file  . Number of children: 3  . Years of education: Not on file  . Highest education level: Not on file  Occupational History  . Occupation: CMA    Employer: [employer]  Tobacco Use  . Smoking status: Never Smoker  . Smokeless tobacco: Never Used  Substance and Sexual Activity  . Alcohol use: No    Alcohol/week: 0.0 standard drinks  . Drug use: No  . Sexual activity: Not on file  Other Topics Concern  . Not on file  Social History Narrative  .  Not on file   Social Determinants of Health   Financial Resource Strain:   . Difficulty of Paying Living Expenses:   Food Insecurity:   . Worried About Programme researcher, broadcasting/film/video in the Last Year:   . Barista in the Last Year:   Transportation Needs:   . Freight forwarder (Medical):   Marland Kitchen Lack of Transportation (Non-Medical):   Physical Activity:   . Days of Exercise per Week:   . Minutes of Exercise per Session:   Stress:   . Feeling of Stress :   Social Connections:   . Frequency of Communication with Friends and Family:   . Frequency of Social Gatherings with Friends and Family:   . Attends Religious Services:   . Active Member of Clubs or Organizations:   . Attends Banker Meetings:   Marland Kitchen Marital Status:   Intimate Partner Violence:   . Fear of Current or Ex-Partner:   . Emotionally Abused:   Marland Kitchen Physically Abused:   . Sexually Abused:      Physical Exam: BP 122/70 (BP Location: Left Arm, Patient Position: Sitting, Cuff Size: Normal)   Pulse 68   Temp 98.1 F (36.7 C)   Ht 5' 3.5" (1.613 m)   Wt 126 lb 6 oz (57.3 kg)   BMI 22.04 kg/m  Constitutional: generally well-appearing Psychiatric: alert and oriented x3 Abdomen: soft, nontender, nondistended, no obvious ascites, no peritoneal signs, normal bowel sounds No peripheral edema noted in lower extremities  Assessment and plan: 74 y.o. female with nausea, heartburn, abdominal discomfort since Covid illness 5 or 6 weeks ago  It certainly seems like she is having issues with acid reflux, acid related dyspeptic problems.  She is taking an H2 blocker around noon time.  She does not take any other antiacid medicines.  I recommended that she change her Pepcid dosing so that she takes it at bedtime nightly.  I am calling her in a prescription for omeprazole 40 mg 1 pill that she should take 20 to 30 minutes prior to her first meal of the day.  I am also going to have her submit stool testing for H. pylori  stool antigen since this is similar to her presentation for H. pylori gastritis 2 years ago.  Please see the "Patient Instructions" section for addition details about the plan.  Rob Bunting, MD [facility] 10/15/2019, 3:32 PM   Total time on date of encounter was30 minutes (this included time spent preparing to see the patient reviewing records; obtaining and/or reviewing separately obtained history; performing a medically appropriate exam and/or evaluation; counseling and educating the patient and family if present; ordering medications, tests or procedures if applicable;  and documenting clinical information in the health record).

## 2019-10-15 NOTE — Patient Instructions (Addendum)
If you are age 75 or older, your body mass index should be between 23-30. Your Body mass index is 22.04 kg/m. If this is out of the aforementioned range listed, please consider follow up with your Primary Care Provider.  If you are age 43 or younger, your body mass index should be between 19-25. Your Body mass index is 22.04 kg/m. If this is out of the aformentioned range listed, please consider follow up with your Primary Care Provider.   Your provider has requested that you go to the basement level for lab work before leaving today. Press "B" on the elevator. The lab is located at the first door on the left as you exit the elevator.  We have sent the following medications to your pharmacy for you to pick up at your convenience:  Omeprazole 40mg  take one capsule shortly before breakfast.  Please do not start Omeprazole until after you hear from our office with your lab results.  Please take Pepcid (famotidine) at bedtime.  Thank you for entrusting me with your care and choosing [facility].  Dr [facility]

## 2019-10-19 ENCOUNTER — Other Ambulatory Visit: Payer: Medicare HMO

## 2019-10-19 DIAGNOSIS — K219 Gastro-esophageal reflux disease without esophagitis: Secondary | ICD-10-CM

## 2019-10-19 DIAGNOSIS — R1013 Epigastric pain: Secondary | ICD-10-CM

## 2019-10-19 DIAGNOSIS — R110 Nausea: Secondary | ICD-10-CM | POA: Diagnosis not present

## 2019-10-20 LAB — HELICOBACTER PYLORI  SPECIAL ANTIGEN
MICRO NUMBER:: 10256636
SPECIMEN QUALITY: ADEQUATE

## 2019-11-08 ENCOUNTER — Telehealth: Payer: Self-pay | Admitting: *Deleted

## 2019-11-09 NOTE — Telephone Encounter (Signed)
Called pt and advised that Rx is ready for pickup.

## 2019-11-09 NOTE — Telephone Encounter (Signed)
Can you start one of their [place] Rx forms with her information for me to finish

## 2019-11-26 ENCOUNTER — Other Ambulatory Visit: Payer: Self-pay | Admitting: Internal Medicine

## 2019-11-30 ENCOUNTER — Telehealth (INDEPENDENT_AMBULATORY_CARE_PROVIDER_SITE_OTHER): Payer: Medicare HMO | Admitting: Internal Medicine

## 2019-11-30 VITALS — Wt 125.0 lb

## 2019-11-30 DIAGNOSIS — R5383 Other fatigue: Secondary | ICD-10-CM | POA: Diagnosis not present

## 2019-11-30 DIAGNOSIS — E039 Hypothyroidism, unspecified: Secondary | ICD-10-CM | POA: Diagnosis not present

## 2019-11-30 DIAGNOSIS — R1011 Right upper quadrant pain: Secondary | ICD-10-CM

## 2019-11-30 MED ORDER — LEVOTHYROXINE SODIUM 100 MCG PO TABS: 100.0000 ug | ORAL_TABLET | Freq: Every day | ORAL | 0 refills | Status: DC

## 2019-11-30 MED ORDER — ALPRAZOLAM 0.5 MG PO TABS: 0.5000 mg | ORAL_TABLET | Freq: Two times a day (BID) | ORAL | 1 refills | Status: DC

## 2019-11-30 NOTE — Progress Notes (Signed)
Virtual Visit via Telephone Note  I connected with Julie Duffy on 11/30/19 at  3:45 PM EDT by telephone and verified that I am speaking with the correct person using two identifiers.   I discussed the limitations, risks, security and privacy concerns of performing an evaluation and management service by telephone and the availability of in person appointments. I also discussed with the patient that there may be a patient responsible charge related to this service. The patient expressed understanding and agreed to proceed.  Location patient: home Location provider: work office Participants present for the call: patient, provider Patient did not have a visit in the prior 7 days to address this/these issue(s).   History of Present Illness:  She has scheduled this visit to discuss some acute abdominal issues.  She has had them for a few months but they have become progressive in nature over the past couple weeks.  Now every time she eats she gets bloated, she has belly pain in the middle and right side of her abdomen, sometimes pain will radiate to her right shoulder and shoulder blade.  She does not describe any fevers, she has not vomited but has been significantly nauseous.  She has also had an increase in stool frequently.  She continues to take her PPI for her GERD.  Feels like this pain is different.   Observations/Objective: Patient sounds cheerful and well on the phone. I do not appreciate any increased work of breathing. Speech and thought processing are grossly intact. Patient reported vitals: None reported   Current Outpatient Medications:  .  ALPRAZolam (XANAX) 0.5 MG tablet, Take 1 tablet (0.5 mg total) by mouth 2 (two) times daily., Disp: 30 tablet, Rfl: 1 .  amLODipine (NORVASC) 5 MG tablet, TAKE 1 TABLET BY MOUTH EVERY DAY, Disp: 90 tablet, Rfl: 1 .  Calcium Carbonate Antacid (ANTACID PO), Take by mouth as needed (Gaviscon). , Disp: , Rfl:  .  famotidine (PEPCID) 20  MG tablet, Take 1 tablet (20 mg total) by mouth at bedtime., Disp: , Rfl:  .  levothyroxine (SYNTHROID) 100 MCG tablet, Take 1 tablet (100 mcg total) by mouth daily., Disp: 90 tablet, Rfl: 0 .  omeprazole (PRILOSEC) 40 MG capsule, Take 1 capsule shortly before breakfast meal, Disp: 30 capsule, Rfl: 6  Review of Systems:  Constitutional: Denies fever, chills, diaphoresis, appetite change and fatigue.  HEENT: Denies photophobia, eye pain, redness, hearing loss, ear pain, congestion, sore throat, rhinorrhea, sneezing, mouth sores, trouble swallowing, neck pain, neck stiffness and tinnitus.   Respiratory: Denies SOB, DOE, cough, chest tightness,  and wheezing.   Cardiovascular: Denies chest pain, palpitations and leg swelling.  Gastrointestinal: Denies vomiting,  constipation, blood in stool. Genitourinary: Denies dysuria, urgency, frequency, hematuria, flank pain and difficulty urinating.  Endocrine: Denies: hot or cold intolerance, sweats, changes in hair or nails, polyuria, polydipsia. Musculoskeletal: Denies myalgias, back pain, joint swelling, arthralgias and gait problem.  Skin: Denies pallor, rash and wound.  Neurological: Denies dizziness, seizures, syncope, weakness, light-headedness, numbness and headaches.  Hematological: Denies adenopathy. Easy bruising, personal or family bleeding history  Psychiatric/Behavioral: Denies suicidal ideation, mood changes, confusion, nervousness, sleep disturbance and agitation   Assessment and Plan:  RUQ abdominal pain  -What she is describing could possibly be biliary colic. -Check CBC, CMP, right upper quadrant ultrasound. -Further plan pending results.   Hypothyroidism, unspecified type  - Plan: levothyroxine (SYNTHROID) 100 MCG tablet      I discussed the assessment and treatment plan  with the patient. The patient was provided an opportunity to ask questions and all were answered. The patient agreed with the plan and demonstrated an  understanding of the instructions.   The patient was advised to call back or seek an in-person evaluation if the symptoms worsen or if the condition fails to improve as anticipated.  I provided 21 minutes of non-face-to-face time during this encounter.   Chaya Jan, MD [facility]

## 2019-12-01 ENCOUNTER — Other Ambulatory Visit: Payer: Medicare HMO

## 2019-12-01 ENCOUNTER — Other Ambulatory Visit: Payer: Self-pay

## 2019-12-01 LAB — COMPREHENSIVE METABOLIC PANEL
ALT: 14 U/L (ref 0–35)
AST: 19 U/L (ref 0–37)
Albumin: 4.3 g/dL (ref 3.5–5.2)
Alkaline Phosphatase: 78 U/L (ref 39–117)
BUN: 17 mg/dL (ref 6–23)
CO2: 31 mEq/L (ref 19–32)
Calcium: 9.7 mg/dL (ref 8.4–10.5)
Chloride: 101 mEq/L (ref 96–112)
Creatinine, Ser: 1.01 mg/dL (ref 0.40–1.20)
GFR: 53.47 mL/min — ABNORMAL LOW (ref >60.00)
Glucose, Bld: 100 mg/dL — ABNORMAL HIGH (ref 70–99)
Potassium: 4.1 mEq/L (ref 3.5–5.1)
Sodium: 138 mEq/L (ref 135–145)
Total Bilirubin: 1.1 mg/dL (ref 0.2–1.2)
Total Protein: 6.7 g/dL (ref 6.0–8.3)

## 2019-12-01 NOTE — Addendum Note (Signed)
Addended by: Bonnye Fava on: 12/01/2019 10:34 AM   Modules accepted: Orders

## 2019-12-02 ENCOUNTER — Other Ambulatory Visit: Payer: Self-pay | Admitting: Internal Medicine

## 2019-12-02 DIAGNOSIS — E039 Hypothyroidism, unspecified: Secondary | ICD-10-CM

## 2019-12-02 LAB — CBC WITH DIFFERENTIAL/PLATELET
Basophils Absolute: 0.1 10*3/uL (ref 0.0–0.1)
Basophils Relative: 0.9 % (ref 0.0–3.0)
Eosinophils Absolute: 0.3 10*3/uL (ref 0.0–0.7)
Eosinophils Relative: 4.8 % (ref 0.0–5.0)
HCT: 38.8 % (ref 36.0–46.0)
Hemoglobin: 13 g/dL (ref 12.0–15.0)
Lymphocytes Relative: 26.9 % (ref 12.0–46.0)
Lymphs Abs: 1.8 10*3/uL (ref 0.7–4.0)
MCHC: 33.5 g/dL (ref 30.0–36.0)
MCV: 89.5 fl (ref 78.0–100.0)
Monocytes Absolute: 0.4 10*3/uL (ref 0.1–1.0)
Monocytes Relative: 6.6 % (ref 3.0–12.0)
Neutro Abs: 4.1 10*3/uL (ref 1.4–7.7)
Neutrophils Relative %: 60.8 % (ref 43.0–77.0)
Platelets: 279 10*3/uL (ref 150.0–400.0)
RBC: 4.34 Mil/uL (ref 3.87–5.11)
RDW: 13.8 % (ref 11.5–15.5)
WBC: 6.8 10*3/uL (ref 4.0–10.5)

## 2019-12-03 ENCOUNTER — Telehealth: Payer: Self-pay | Admitting: Internal Medicine

## 2019-12-03 NOTE — Telephone Encounter (Signed)
Patient is aware of lab results.

## 2019-12-03 NOTE — Telephone Encounter (Signed)
Patient called again to get lab results.  Please advise

## 2019-12-03 NOTE — Telephone Encounter (Signed)
Pt call and want a call back about her labs results . ?

## 2019-12-20 ENCOUNTER — Other Ambulatory Visit: Payer: Self-pay | Admitting: Family

## 2019-12-23 ENCOUNTER — Ambulatory Visit
Admission: RE | Admit: 2019-12-23 | Discharge: 2019-12-23 | Disposition: A | Discharge status: Home / Self Care | Payer: Medicare HMO | Source: Ambulatory Visit | Attending: Internal Medicine | Admitting: Internal Medicine

## 2019-12-23 DIAGNOSIS — R1011 Right upper quadrant pain: Secondary | ICD-10-CM

## 2019-12-30 ENCOUNTER — Telehealth: Payer: Self-pay | Admitting: Gastroenterology

## 2019-12-30 NOTE — Telephone Encounter (Signed)
Left message on machine to call back  

## 2019-12-31 NOTE — Telephone Encounter (Signed)
Placed a call to the pt and left a message to call back if she still has questions or concerns

## 2020-01-20 ENCOUNTER — Other Ambulatory Visit (HOSPITAL_COMMUNITY)
Admission: RE | Admit: 2020-01-20 | Discharge: 2020-01-20 | Disposition: A | Discharge status: Home / Self Care | Payer: Medicare HMO | Source: Ambulatory Visit | Attending: Gastroenterology | Admitting: Gastroenterology

## 2020-01-20 ENCOUNTER — Encounter: Payer: Self-pay | Admitting: Physician Assistant

## 2020-01-20 ENCOUNTER — Ambulatory Visit: Payer: Medicare HMO | Admitting: Physician Assistant

## 2020-01-20 VITALS — BP 122/74 | HR 72 | Ht 64.5 in | Wt 122.4 lb

## 2020-01-20 DIAGNOSIS — R110 Nausea: Secondary | ICD-10-CM | POA: Diagnosis not present

## 2020-01-20 DIAGNOSIS — R634 Abnormal weight loss: Secondary | ICD-10-CM

## 2020-01-20 DIAGNOSIS — K589 Irritable bowel syndrome without diarrhea: Secondary | ICD-10-CM | POA: Insufficient documentation

## 2020-01-20 DIAGNOSIS — K582 Mixed irritable bowel syndrome: Secondary | ICD-10-CM

## 2020-01-20 DIAGNOSIS — R1084 Generalized abdominal pain: Secondary | ICD-10-CM

## 2020-01-20 LAB — BASIC METABOLIC PANEL
Anion gap: 10 (ref 5–15)
BUN: 13 mg/dL (ref 8–23)
CO2: 28 mmol/L (ref 22–32)
Calcium: 9.7 mg/dL (ref 8.9–10.3)
Chloride: 100 mmol/L (ref 98–111)
Creatinine, Ser: 0.89 mg/dL (ref 0.44–1.00)
GFR calc Af Amer: >60 mL/min (ref >60)
GFR calc non Af Amer: >60 mL/min (ref >60)
Glucose, Bld: 90 mg/dL (ref 70–99)
Potassium: 4.2 mmol/L (ref 3.5–5.1)
Sodium: 138 mmol/L (ref 135–145)

## 2020-01-20 LAB — SEDIMENTATION RATE: Sed Rate: 8 mm/hr (ref 0–22)

## 2020-01-20 MED ORDER — ONDANSETRON HCL 4 MG PO TABS
4.0000 mg | ORAL_TABLET | Freq: Two times a day (BID) | ORAL | 3 refills | Status: DC
Start: 2020-01-20 — End: 2020-09-20

## 2020-01-20 MED ORDER — GLYCOPYRROLATE 2 MG PO TABS
2.0000 mg | ORAL_TABLET | Freq: Two times a day (BID) | ORAL | 6 refills | Status: DC
Start: 2020-01-20 — End: 2020-09-20

## 2020-01-20 NOTE — Progress Notes (Signed)
Subjective:    Patient ID: Julie Duffy, female    DOB: 07/21/1946, 74 y.o.   MRN: [id number]  HPI Zenora is a pleasant 74 year old white female, established with Dr. Ardis Hughs, who comes in today with complaints of abdominal pain, gas bloating and persistent nausea. She was last seen in March 2021 with dyspeptic complaints.  At that time H. pylori stool antigen was done which was negative.  For IBS symptoms she was continued on Linzess 145 mcg daily, probiotic and Benefiber. She has family history of colon cancer in her mother.  She last had colonoscopy in October 2018 with no polyps and was indicated for 5-year interval follow-up. She says at this point she really is not having any problems with constipation which she has had difficulty with in the past.  Primarily she is complaining of a persistent nausea which has been bothering her constantly.  She says sometimes this may let up a bit later in the day and sometimes may be a little bit better after eating for short period She is also having gas and bloating on a daily basis and a constant sensation of urge for bowel movement though she frequently does not go, just has an urgency sensation.  Generally she is having 1 normal bowel movement per day. She is taking a half of Xanax at nighttime which helps her sleep and she says this also seems to calm her abdominal symptoms which are otherwise making her miserable.  She is having some issues with heartburn and burning in her chest.  She is sensitive to multiple medications and generally does not take prescription medications well. She is worried about her current symptoms as she is also lost about 15 pounds over the past year to year and a half.  She says she generally cannot eat 3 meals a day and frequently feels full and nauseated.  She says she is most days she has to force herself to eat. At present she has stopped the Linzess and is no longer taking Benefiber or Pepcid.  Review of Systems Pertinent  positive and negative review of systems were noted in the above HPI section.  All other review of systems was otherwise negative.  Outpatient Encounter Medications as of 01/20/2020  Medication Sig  . ALPRAZolam (XANAX) 0.5 MG tablet Take 1 tablet (0.5 mg total) by mouth 2 (two) times daily.  Marland Kitchen amLODipine (NORVASC) 5 MG tablet TAKE 1 TABLET BY MOUTH EVERY DAY  . levothyroxine (SYNTHROID) 100 MCG tablet Take one tablet every morning 30 minutes before breakfast  . glycopyrrolate (ROBINUL) 2 MG tablet Take 1 tablet (2 mg total) by mouth 2 (two) times daily.  . ondansetron (ZOFRAN) 4 MG tablet Take 1 tablet (4 mg total) by mouth 2 (two) times daily.  . [DISCONTINUED] Calcium Carbonate Antacid (ANTACID PO) Take by mouth as needed (Gaviscon).   . [DISCONTINUED] famotidine (PEPCID) 20 MG tablet Take 1 tablet (20 mg total) by mouth at bedtime.  . [DISCONTINUED] omeprazole (PRILOSEC) 20 MG capsule TAKE 1 CAPSULE BY MOUTH EVERY DAY  . [DISCONTINUED] omeprazole (PRILOSEC) 40 MG capsule Take 1 capsule shortly before breakfast meal   No facility-administered encounter medications on file as of 01/20/2020.   Allergies  Allergen Reactions  . Guaifenesin & Derivatives Nausea And Vomiting  . Clindamycin Other (See Comments)    GI intolerance  . Clindamycin/Lincomycin Cross Reactors Other (See Comments)    GI intolerance  . Codeine Other (See Comments)    hallucinations  . Sulfa  Antibiotics Other (See Comments)    unspecified   Patient Active Problem List   Diagnosis Date Noted  . IBS (irritable colon syndrome) 01/20/2020  . Osteoarthritis of spine with radiculopathy, cervical region 08/16/2016  . Essential hypertension 09/28/2015  . Paresthesia and pain of extremity 09/02/2011  . VITILIGO 01/25/2008  . Hypothyroidism 07/16/2007   Social History   Socioeconomic History  . Marital status: Divorced    Spouse name: Not on file  . Number of children: 3  . Years of education: Not on file  . Highest  education level: Not on file  Occupational History  . Occupation: CMA    Employer: [employer]  Tobacco Use  . Smoking status: Never Smoker  . Smokeless tobacco: Never Used  Vaping Use  . Vaping Use: Never used  Substance and Sexual Activity  . Alcohol use: No    Alcohol/week: 0.0 standard drinks  . Drug use: No  . Sexual activity: Not on file  Other Topics Concern  . Not on file  Social History Narrative  . Not on file   Social Determinants of Health   Financial Resource Strain:   . Difficulty of Paying Living Expenses:   Food Insecurity:   . Worried About Charity fundraiser in the Last Year:   . Arboriculturist in the Last Year:   Transportation Needs:   . Film/video editor (Medical):   Marland Kitchen Lack of Transportation (Non-Medical):   Physical Activity:   . Days of Exercise per Week:   . Minutes of Exercise per Session:   Stress:   . Feeling of Stress :   Social Connections:   . Frequency of Communication with Friends and Family:   . Frequency of Social Gatherings with Friends and Family:   . Attends Religious Services:   . Active Member of Clubs or Organizations:   . Attends Archivist Meetings:   Marland Kitchen Marital Status:   Intimate Partner Violence:   . Fear of Current or Ex-Partner:   . Emotionally Abused:   Marland Kitchen Physically Abused:   . Sexually Abused:     Ms. Hoheisel family history includes Cancer in her father; Cancer (age of onset: 24) in her mother; Colon cancer in her mother; Heart disease in her mother; Thyroid disease in her daughter and mother.      Objective:    Vitals:   01/20/20 1102  BP: 122/74  Pulse: 72    Physical Exam Well-developed well-nourished older white female in no acute distress.  Height, Weight, 122 BMI 20.6  HEENT; nontraumatic normocephalic, EOMI, PE RR R LA, sclera anicteric. Oropharynx; not examined Neck; supple, no JVD Cardiovascular; regular rate and rhythm with S1-S2, no murmur rub or gallop Pulmonary;  Clear bilaterally Abdomen; soft, mild rather generalized tenderness nondistended, no palpable mass or hepatosplenomegaly, bowel sounds are active Rectal; not done today Skin; benign exam, no jaundice rash or appreciable lesions Extremities; no clubbing cyanosis or edema skin warm and dry Neuro/Psych; alert and oriented x4, grossly nonfocal mood and affect appropriate      Assessment & Plan:   #53 74 year old white female with history of IBS and constipation as well as dyspepsia and prior history of H. pylori gastropathy.  She comes in today with complaints of persistent nausea over the past several months poor appetite, weight loss of about 15 pounds over the past year or so.  He has also had associated abdominal bloating gas and ongoing sense of urgency for  bowel movements though having 1 normal bowel movement per day. I think her symptoms are for the most part related to IBS, though we will not necessarily explain persistent nausea. Rule out chronic gastritis, rule out functional dyspepsia, rule out component of gastroparesis, rule out underlying malignancy though feel less likely.  #2 family history of colon cancer-up-to-date with colon screening last done October 2018/no polyps due for follow-up 2023  #3  History of H. pylori gastritis 2019 recent H. pylori stool antigen negative #4 anxiety  Plan; CBC with differential, c-Met, sed rate, TTG and IgA Schedule for CT of the abdomen and pelvis with contrast. Start Zofran 4 mg p.o. twice daily scheduled. Start glycopyrrolate 2 mg p.o. every morning scheduled, may take second dose in the p.m. as needed. We will plan office follow-up with Dr. Ardis Hughs or myself in about 1 month. Further recommendations pending findings of labs and CT and response to above regimen.  Consider EGD  Plan   Doylene Splinter S Nero Sawatzky PA-C 01/20/2020   Cc: Isaac Bliss, [place]*

## 2020-01-20 NOTE — Patient Instructions (Addendum)
If you are age 75 or older, your body mass index should be between 23-30. Your Body mass index is 20.68 kg/m. If this is out of the aforementioned range listed, please consider follow up with your Primary Care Provider.  If you are age 47 or younger, your body mass index should be between 19-25. Your Body mass index is 20.68 kg/m. If this is out of the aformentioned range listed, please consider follow up with your Primary Care Provider.   Your provider has requested that you go to the basement level for lab work before leaving today. Press "B" on the elevator. The lab is located at the first door on the left as you exit the elevator.  Due to recent changes in healthcare laws, you may see the results of your imaging and laboratory studies on MyChart before your provider has had a chance to review them.  We understand that in some cases there may be results that are confusing or concerning to you. Not all laboratory results come back in the same time frame and the provider may be waiting for multiple results in order to interpret others.  Please give [place] 48 hours in order for your provider to thoroughly review all the results before contacting the office for clarification of your results.   You have been scheduled for a CT scan of the abdomen and pelvis at [facility], 1st floor Radiology. Be sure not to have anything to eat or drink 4 hour prior to imaging except your contrast. You will need to take your 1st bottle at 12:00 pm and you 2nd at 1:00 pm  You are scheduled on 01/26/20 at 2:00 pm. You should arrive 15 minutes prior to your appointment time for registration.   Please go to [facility] Radiology at least 3 days prior to your procedure to pick up instructions and contrast for the exam.  WARNING: IF YOU ARE ALLERGIC TO IODINE/X-RAY DYE, PLEASE NOTIFY RADIOLOGY IMMEDIATELY AT [phone]! YOU WILL BE GIVEN A 13 HOUR PREMEDICATION PREP.   If you have any questions regarding your exam  or if you need to reschedule, you may call [phone] between the hours of 8:00 am and 5:00 pm, Monday-Friday.  ______________________________________________________________________  START Ondansetron 4 mg twice a day for nausea. START Glycopyrrolate 2 mg po twice a day for abdominal pain/cramping/bloating. This has been sent to your pharmacy   You have been scheduled to follow up with Dr. Christella Hartigan on March 21, 2020 at 10:30 am

## 2020-01-21 LAB — IGA: IgA: 208 mg/dL (ref 64–422)

## 2020-01-21 LAB — TISSUE TRANSGLUTAMINASE, IGA: Tissue Transglutaminase Ab, IgA: <2 U/mL (ref 0–3)

## 2020-01-21 NOTE — Progress Notes (Signed)
I agree with the above note, plan 

## 2020-01-26 ENCOUNTER — Ambulatory Visit (HOSPITAL_COMMUNITY)
Admission: RE | Admit: 2020-01-26 | Discharge: 2020-01-26 | Disposition: A | Discharge status: Home / Self Care | Payer: Medicare HMO | Source: Ambulatory Visit | Attending: Physician Assistant | Admitting: Physician Assistant

## 2020-01-26 ENCOUNTER — Other Ambulatory Visit: Payer: Self-pay

## 2020-01-26 DIAGNOSIS — R1084 Generalized abdominal pain: Secondary | ICD-10-CM | POA: Insufficient documentation

## 2020-01-26 DIAGNOSIS — R110 Nausea: Secondary | ICD-10-CM | POA: Insufficient documentation

## 2020-01-26 DIAGNOSIS — R634 Abnormal weight loss: Secondary | ICD-10-CM | POA: Diagnosis not present

## 2020-01-26 DIAGNOSIS — R109 Unspecified abdominal pain: Secondary | ICD-10-CM | POA: Diagnosis not present

## 2020-01-26 MED ORDER — SODIUM CHLORIDE (PF) 0.9 % IJ SOLN
INTRAMUSCULAR | Status: AC
  Filled 2020-01-26: qty 50

## 2020-01-26 MED ORDER — IOHEXOL 300 MG/ML  SOLN
100.0000 mL | Freq: Once | INTRAMUSCULAR | Status: AC | PRN
  Administered 2020-01-26: 100 mL via INTRAVENOUS

## 2020-03-17 DIAGNOSIS — N760 Acute vaginitis: Secondary | ICD-10-CM | POA: Diagnosis not present

## 2020-03-21 ENCOUNTER — Ambulatory Visit: Payer: Medicare HMO | Admitting: Gastroenterology

## 2020-04-18 ENCOUNTER — Other Ambulatory Visit: Payer: Self-pay | Admitting: Internal Medicine

## 2020-04-18 DIAGNOSIS — R5383 Other fatigue: Secondary | ICD-10-CM

## 2020-08-15 ENCOUNTER — Other Ambulatory Visit: Payer: Self-pay | Admitting: Internal Medicine

## 2020-08-15 DIAGNOSIS — E039 Hypothyroidism, unspecified: Secondary | ICD-10-CM

## 2020-09-20 ENCOUNTER — Ambulatory Visit (INDEPENDENT_AMBULATORY_CARE_PROVIDER_SITE_OTHER): Payer: Medicare HMO | Admitting: Family Medicine

## 2020-09-20 ENCOUNTER — Encounter: Payer: Self-pay | Admitting: Family Medicine

## 2020-09-20 ENCOUNTER — Other Ambulatory Visit: Payer: Self-pay

## 2020-09-20 VITALS — BP 150/80 | HR 52 | Ht 64.5 in | Wt 130.0 lb

## 2020-09-20 DIAGNOSIS — M7742 Metatarsalgia, left foot: Secondary | ICD-10-CM

## 2020-09-20 DIAGNOSIS — M7741 Metatarsalgia, right foot: Secondary | ICD-10-CM

## 2020-09-20 NOTE — Progress Notes (Signed)
Established Patient Office Visit  Subjective:  Patient ID: Julie Duffy, female    DOB: July 17, 1946  Age: 75 y.o. MRN: [id number]  CC:  Chief Complaint  Patient presents with  . Foot Pain    bilateral    HPI Julie Duffy presents for bilateral foot pain about 3 days duration.  She is generally very active.  Denies any change of shoes.  She does walk some for exercise but no recent change in distance.  She describes pain across the ball of both feet diffusely.  She does have some calluses but states this is different from her callus pain.  Denies any visible swelling.  Pain is only with pressure and weightbearing.  Her pain is very diffuse and not localized.  Past Medical History:  Diagnosis Date  . Allergy    seasonal  . Anxiety   . Constipation   . COVID-19 virus infection 09/2019  . H. pylori infection   . Hypertension   . Hypothyroid   . Scoliosis     Past Surgical History:  Procedure Laterality Date  . APPENDECTOMY    . NASAL SINUS SURGERY    . NECK LESION BIOPSY     cysts benign  . TUBAL LIGATION    . UMBILICAL HERNIA REPAIR      Family History  Problem Relation Age of Onset  . Cancer Mother 4       colon ca  . Heart disease Mother   . Colon cancer Mother   . Thyroid disease Mother   . Cancer Father   . Thyroid disease Daughter   . Esophageal cancer Neg Hx   . Stomach cancer Neg Hx     Social History   Socioeconomic History  . Marital status: Divorced    Spouse name: Not on file  . Number of children: 3  . Years of education: Not on file  . Highest education level: Not on file  Occupational History  . Occupation: CMA    Employer: [employer]  Tobacco Use  . Smoking status: Never Smoker  . Smokeless tobacco: Never Used  Vaping Use  . Vaping Use: Never used  Substance and Sexual Activity  . Alcohol use: No    Alcohol/week: 0.0 standard drinks  . Drug use: No  . Sexual activity: Not on file  Other Topics Concern  . Not on  file  Social History Narrative  . Not on file   Social Determinants of Health   Financial Resource Strain: Not on file  Food Insecurity: Not on file  Transportation Needs: Not on file  Physical Activity: Not on file  Stress: Not on file  Social Connections: Not on file  Intimate Partner Violence: Not on file    Outpatient Medications Prior to Visit  Medication Sig Dispense Refill  . ALPRAZolam (XANAX) 0.5 MG tablet TAKE 1 TABLET BY MOUTH 2 TIMES DAILY. 30 tablet 1  . levothyroxine (SYNTHROID) 100 MCG tablet TAKE ONE TABLET EVERY MORNING 30 MINUTES BEFORE BREAKFAST 90 tablet 0  . amLODipine (NORVASC) 5 MG tablet TAKE 1 TABLET BY MOUTH EVERY DAY 90 tablet 1  . glycopyrrolate (ROBINUL) 2 MG tablet Take 1 tablet (2 mg total) by mouth 2 (two) times daily. 60 tablet 6  . ondansetron (ZOFRAN) 4 MG tablet Take 1 tablet (4 mg total) by mouth 2 (two) times daily. 60 tablet 3   No facility-administered medications prior to visit.    Allergies  Allergen Reactions  . Guaifenesin &  Derivatives Nausea And Vomiting  . Clindamycin Other (See Comments)    GI intolerance  . Clindamycin/Lincomycin Cross Reactors Other (See Comments)    GI intolerance  . Codeine Other (See Comments)    hallucinations  . Sulfa Antibiotics Other (See Comments)    unspecified    ROS Review of Systems  Neurological: Negative for weakness and numbness.      Objective:    Physical Exam Vitals reviewed.  Constitutional:      Appearance: Normal appearance.  Cardiovascular:     Rate and Rhythm: Normal rate and regular rhythm.  Pulmonary:     Effort: Pulmonary effort is normal.     Breath sounds: Normal breath sounds.  Musculoskeletal:     Comments: She does have bunions involving both feet.  She has some calluses on the balls of both feet but these are nontender.  She does not have any reproducible foot tenderness.  No distal metatarsal tenderness.  Feet are warm to touch with good distal pulses and good  capillary refill  Neurological:     Mental Status: She is alert.     BP (!) 150/80 (BP Location: Left Arm, Cuff Size: Normal)   Pulse (!) 52   Ht 5' 4.5" (1.638 m)   Wt 130 lb (59 kg)   SpO2 93%   BMI 21.97 kg/m  Wt Readings from Last 3 Encounters:  09/20/20 130 lb (59 kg)  01/20/20 122 lb 6 oz (55.5 kg)  11/30/19 125 lb (56.7 kg)     Health Maintenance Due  Topic Date Due  . Hepatitis C Screening  Never done  . COVID-19 Vaccine (1) Never done  . INFLUENZA VACCINE  03/05/2020    There are no preventive care reminders to display for this patient.  Lab Results  Component Value Date   TSH 1.80 09/28/2019   Lab Results  Component Value Date   WBC 6.8 12/01/2019   HGB 13.0 12/01/2019   HCT 38.8 12/01/2019   MCV 89.5 12/01/2019   PLT 279.0 12/01/2019   Lab Results  Component Value Date   NA 138 01/20/2020   K 4.2 01/20/2020   CO2 28 01/20/2020   GLUCOSE 90 01/20/2020   BUN 13 01/20/2020   CREATININE 0.89 01/20/2020   BILITOT 1.1 12/01/2019   ALKPHOS 78 12/01/2019   AST 19 12/01/2019   ALT 14 12/01/2019   PROT 6.7 12/01/2019   ALBUMIN 4.3 12/01/2019   CALCIUM 9.7 01/20/2020   ANIONGAP 10 01/20/2020   GFR 53.47 (L) 12/01/2019   Lab Results  Component Value Date   CHOL 239 (H) 10/02/2010   Lab Results  Component Value Date   HDL 68.00 10/02/2010   No results found for: [facility] Lab Results  Component Value Date   TRIG 91.0 10/02/2010   Lab Results  Component Value Date   CHOLHDL 4 10/02/2010   No results found for: HGBA1C    Assessment & Plan:   Bilateral foot pain.  Suspect metatarsalgia pain.  -Recommend she try metatarsal pads.  If this not relieving her discomfort recommend podiatry referral  No orders of the defined types were placed in this encounter.   Follow-up: Return in about 1 month (around 10/18/2020).    Evelena Peat, MD

## 2020-09-20 NOTE — Patient Instructions (Addendum)
Suspect metatarsalgia  Try to get OTC metatarsal pad   We could set up podiatry referral if pain persists.   Monitor blood pressure and set up follow up with Dr Ardyth Harps to reassess in 3-4 weeks.

## 2020-09-28 DIAGNOSIS — M79671 Pain in right foot: Secondary | ICD-10-CM | POA: Diagnosis not present

## 2020-09-28 DIAGNOSIS — M21612 Bunion of left foot: Secondary | ICD-10-CM | POA: Diagnosis not present

## 2020-09-28 DIAGNOSIS — M21611 Bunion of right foot: Secondary | ICD-10-CM | POA: Diagnosis not present

## 2020-09-28 DIAGNOSIS — G5761 Lesion of plantar nerve, right lower limb: Secondary | ICD-10-CM | POA: Diagnosis not present

## 2020-09-28 DIAGNOSIS — M7752 Other enthesopathy of left foot: Secondary | ICD-10-CM | POA: Diagnosis not present

## 2020-09-28 DIAGNOSIS — M79672 Pain in left foot: Secondary | ICD-10-CM | POA: Diagnosis not present

## 2020-09-28 DIAGNOSIS — M7751 Other enthesopathy of right foot: Secondary | ICD-10-CM | POA: Diagnosis not present

## 2020-10-16 ENCOUNTER — Other Ambulatory Visit: Payer: Self-pay | Admitting: Internal Medicine

## 2020-10-16 DIAGNOSIS — R5383 Other fatigue: Secondary | ICD-10-CM

## 2020-10-16 DIAGNOSIS — E039 Hypothyroidism, unspecified: Secondary | ICD-10-CM

## 2020-10-18 DIAGNOSIS — H35372 Puckering of macula, left eye: Secondary | ICD-10-CM | POA: Diagnosis not present

## 2020-10-18 DIAGNOSIS — H25813 Combined forms of age-related cataract, bilateral: Secondary | ICD-10-CM | POA: Diagnosis not present

## 2020-10-18 DIAGNOSIS — H524 Presbyopia: Secondary | ICD-10-CM | POA: Diagnosis not present

## 2021-01-03 DIAGNOSIS — L648 Other androgenic alopecia: Secondary | ICD-10-CM | POA: Diagnosis not present

## 2021-01-15 ENCOUNTER — Other Ambulatory Visit: Payer: Self-pay | Admitting: Internal Medicine

## 2021-01-15 DIAGNOSIS — E039 Hypothyroidism, unspecified: Secondary | ICD-10-CM

## 2021-01-17 ENCOUNTER — Other Ambulatory Visit: Payer: Self-pay

## 2021-01-18 ENCOUNTER — Encounter: Payer: Self-pay | Admitting: Internal Medicine

## 2021-01-18 ENCOUNTER — Ambulatory Visit (INDEPENDENT_AMBULATORY_CARE_PROVIDER_SITE_OTHER): Payer: Medicare HMO | Admitting: Internal Medicine

## 2021-01-18 VITALS — BP 124/62 | HR 61 | Temp 98.2°F | Wt 129.6 lb

## 2021-01-18 DIAGNOSIS — E039 Hypothyroidism, unspecified: Secondary | ICD-10-CM

## 2021-01-18 DIAGNOSIS — L659 Nonscarring hair loss, unspecified: Secondary | ICD-10-CM | POA: Diagnosis not present

## 2021-01-18 LAB — CBC WITH DIFFERENTIAL/PLATELET
Basophils Absolute: 0.1 10*3/uL (ref 0.0–0.1)
Basophils Relative: 1 % (ref 0.0–3.0)
Eosinophils Absolute: 0.3 10*3/uL (ref 0.0–0.7)
Eosinophils Relative: 4.5 % (ref 0.0–5.0)
HCT: 37.6 % (ref 36.0–46.0)
Hemoglobin: 12.9 g/dL (ref 12.0–15.0)
Lymphocytes Relative: 26.2 % (ref 12.0–46.0)
Lymphs Abs: 1.9 10*3/uL (ref 0.7–4.0)
MCHC: 34.2 g/dL (ref 30.0–36.0)
MCV: 88.7 fl (ref 78.0–100.0)
Monocytes Absolute: 0.4 10*3/uL (ref 0.1–1.0)
Monocytes Relative: 6.2 % (ref 3.0–12.0)
Neutro Abs: 4.4 10*3/uL (ref 1.4–7.7)
Neutrophils Relative %: 62.1 % (ref 43.0–77.0)
Platelets: 281 10*3/uL (ref 150.0–400.0)
RBC: 4.24 Mil/uL (ref 3.87–5.11)
RDW: 13.3 % (ref 11.5–15.5)
WBC: 7.1 10*3/uL (ref 4.0–10.5)

## 2021-01-18 LAB — VITAMIN D 25 HYDROXY (VIT D DEFICIENCY, FRACTURES): VITD: 30.91 ng/mL (ref 30.00–100.00)

## 2021-01-18 LAB — TSH: TSH: 0.61 u[IU]/mL (ref 0.35–4.50)

## 2021-01-18 LAB — VITAMIN B12: Vitamin B-12: 250 pg/mL (ref 211–911)

## 2021-01-18 NOTE — Progress Notes (Signed)
Established Patient Office Visit     This visit occurred during the SARS-CoV-2 public health emergency.  Safety protocols were in place, including screening questions prior to the visit, additional usage of staff PPE, and extensive cleaning of exam room while observing appropriate contact time as indicated for disinfecting solutions.    CC/Reason for Visit: Discuss acute condition  HPI: Julie Duffy is a 75 y.o. female who is coming in today for the above mentioned reasons. Past Medical History is significant for: Hypertension not on medication and hypothyroidism.  She has noticed significant hair loss.  Her hairdresser actually brought it up to her.  She has not been in here in some time or had labs drawn.  She is requesting thyroid levels and iron levels.  Other than this she has no acute issues.   Past Medical/Surgical History: Past Medical History:  Diagnosis Date   Allergy    seasonal   Anxiety    Constipation    COVID-19 virus infection 09/2019   H. pylori infection    Hypertension    Hypothyroid    Scoliosis     Past Surgical History:  Procedure Laterality Date   APPENDECTOMY     NASAL SINUS SURGERY     NECK LESION BIOPSY     cysts benign   TUBAL LIGATION     UMBILICAL HERNIA REPAIR      Social History:  reports that she has never smoked. She has never used smokeless tobacco. She reports that she does not drink alcohol and does not use drugs.  Allergies: Allergies  Allergen Reactions   Guaifenesin & Derivatives Nausea And Vomiting   Clindamycin Other (See Comments)    GI intolerance   Clindamycin/Lincomycin Cross Reactors Other (See Comments)    GI intolerance   Codeine Other (See Comments)    hallucinations   Sulfa Antibiotics Other (See Comments)    unspecified    Family History:  Family History  Problem Relation Age of Onset   Cancer Mother 60       colon ca   Heart disease Mother    Colon cancer Mother    Thyroid disease Mother     Cancer Father    Thyroid disease Daughter    Esophageal cancer Neg Hx    Stomach cancer Neg Hx      Current Outpatient Medications:    ALPRAZolam (XANAX) 0.5 MG tablet, TAKE 1 TABLET BY MOUTH TWICE A DAY, Disp: 30 tablet, Rfl: 0   levothyroxine (SYNTHROID) 100 MCG tablet, TAKE 1 TABLET BY MOUTH EVERY DAY, Disp: 90 tablet, Rfl: 0  Review of Systems:  Constitutional: Denies fever, chills, diaphoresis, appetite change and fatigue.  HEENT: Denies photophobia, eye pain, redness, hearing loss, ear pain, congestion, sore throat, rhinorrhea, sneezing, mouth sores, trouble swallowing, neck pain, neck stiffness and tinnitus.   Respiratory: Denies SOB, DOE, cough, chest tightness,  and wheezing.   Cardiovascular: Denies chest pain, palpitations and leg swelling.  Gastrointestinal: Denies nausea, vomiting, abdominal pain, diarrhea, constipation, blood in stool and abdominal distention.  Genitourinary: Denies dysuria, urgency, frequency, hematuria, flank pain and difficulty urinating.  Endocrine: Denies: hot or cold intolerance, sweats, polyuria, polydipsia. Musculoskeletal: Denies myalgias, back pain, joint swelling, arthralgias and gait problem.  Skin: Denies pallor, rash and wound.  Neurological: Denies dizziness, seizures, syncope, weakness, light-headedness, numbness and headaches.  Hematological: Denies adenopathy. Easy bruising, personal or family bleeding history  Psychiatric/Behavioral: Denies suicidal ideation, mood changes, confusion, nervousness, sleep disturbance and agitation  Physical Exam: Vitals:   01/18/21 1131  BP: 124/62  Pulse: 61  Temp: 98.2 F (36.8 C)  TempSrc: Oral  SpO2: 95%  Weight: 129 lb 9.6 oz (58.8 kg)    Body mass index is 21.9 kg/m.   Constitutional: NAD, calm, comfortable Eyes: PERRL, lids and conjunctivae normal ENMT: Mucous membranes are moist.  Respiratory: clear to auscultation bilaterally, no wheezing, no crackles. Normal respiratory effort.  No accessory muscle use.  Cardiovascular: Regular rate and rhythm, no murmurs / rubs / gallops. No extremity edema.  Neurologic: Grossly intact and nonfocal Psychiatric: Normal judgment and insight. Alert and oriented x 3. Normal mood.    Impression and Plan:  Hair loss  - Plan: CBC with Differential/Platelet, Vitamin B12, VITAMIN D 25 Hydroxy (Vit-D Deficiency, Fractures), Iron, TIBC and Ferritin Panel -Check above labs, further work-up to follow. -She will arrange follow-up for physical.  Hypothyroidism, unspecified type  - Plan: TSH -She is currently on levothyroxine 100 mcg daily    Patient Instructions  -Nice seeing you today!!  -Lab work today; will notify you once results are available.  -Schedule follow up in 3 months for your physical. Please come in fasting that day.    Chaya Jan, MD  Primary Care at [facility]

## 2021-01-18 NOTE — Patient Instructions (Signed)
-  Nice seeing you today!!  -Lab work today; will notify you once results are available.  -Schedule follow up in 3 months for your physical. Please come in fasting that day. 

## 2021-01-18 NOTE — Addendum Note (Signed)
Addended by: Kandra Nicolas on: 01/18/2021 11:53 AM   Modules accepted: Orders

## 2021-01-19 ENCOUNTER — Encounter: Payer: Self-pay | Admitting: Internal Medicine

## 2021-01-19 ENCOUNTER — Other Ambulatory Visit: Payer: Self-pay | Admitting: Internal Medicine

## 2021-01-19 DIAGNOSIS — E559 Vitamin D deficiency, unspecified: Secondary | ICD-10-CM | POA: Insufficient documentation

## 2021-01-19 LAB — IRON,TIBC AND FERRITIN PANEL
%SAT: 31 % (calc) (ref 16–45)
Ferritin: 68 ng/mL (ref 16–288)
Iron: 93 ug/dL (ref 45–160)
TIBC: 297 mcg/dL (calc) (ref 250–450)

## 2021-01-19 MED ORDER — VITAMIN D (ERGOCALCIFEROL) 1.25 MG (50000 UNIT) PO CAPS: 50000.0000 [IU] | ORAL_CAPSULE | ORAL | 0 refills | Status: AC

## 2021-01-22 ENCOUNTER — Telehealth: Payer: Self-pay | Admitting: Internal Medicine

## 2021-01-22 NOTE — Telephone Encounter (Signed)
Pt is calling the office to get her lab results

## 2021-01-24 ENCOUNTER — Other Ambulatory Visit: Payer: Self-pay | Admitting: Internal Medicine

## 2021-01-24 DIAGNOSIS — E559 Vitamin D deficiency, unspecified: Secondary | ICD-10-CM

## 2021-01-24 NOTE — Telephone Encounter (Signed)
Patient returned Rachel's call. °

## 2021-01-24 NOTE — Telephone Encounter (Signed)
Left message on machine for patient to return our call.  See lab result note. 

## 2021-01-25 ENCOUNTER — Other Ambulatory Visit: Payer: Self-pay | Admitting: Internal Medicine

## 2021-01-25 DIAGNOSIS — R5383 Other fatigue: Secondary | ICD-10-CM

## 2021-01-25 NOTE — Telephone Encounter (Signed)
Rachel spoke with patient

## 2021-02-16 ENCOUNTER — Other Ambulatory Visit: Payer: Self-pay | Admitting: Occupational Medicine

## 2021-02-16 ENCOUNTER — Ambulatory Visit: Payer: Self-pay

## 2021-02-16 ENCOUNTER — Other Ambulatory Visit: Payer: Self-pay

## 2021-02-16 DIAGNOSIS — M545 Low back pain, unspecified: Secondary | ICD-10-CM

## 2021-03-31 DIAGNOSIS — R8289 Other abnormal findings on cytological and histological examination of urine: Secondary | ICD-10-CM | POA: Diagnosis not present

## 2021-03-31 DIAGNOSIS — R309 Painful micturition, unspecified: Secondary | ICD-10-CM | POA: Diagnosis not present

## 2021-04-22 ENCOUNTER — Other Ambulatory Visit: Payer: Self-pay | Admitting: Internal Medicine

## 2021-04-22 DIAGNOSIS — E039 Hypothyroidism, unspecified: Secondary | ICD-10-CM

## 2021-04-24 ENCOUNTER — Encounter: Payer: Self-pay | Admitting: Internal Medicine

## 2021-04-24 ENCOUNTER — Ambulatory Visit (INDEPENDENT_AMBULATORY_CARE_PROVIDER_SITE_OTHER): Payer: Medicare HMO | Admitting: Internal Medicine

## 2021-04-24 ENCOUNTER — Other Ambulatory Visit: Payer: Self-pay | Admitting: Internal Medicine

## 2021-04-24 ENCOUNTER — Other Ambulatory Visit: Payer: Self-pay

## 2021-04-24 VITALS — BP 120/84 | HR 70 | Temp 97.7°F | Ht 65.0 in | Wt 127.8 lb

## 2021-04-24 DIAGNOSIS — Z0000 Encounter for general adult medical examination without abnormal findings: Secondary | ICD-10-CM

## 2021-04-24 DIAGNOSIS — E039 Hypothyroidism, unspecified: Secondary | ICD-10-CM

## 2021-04-24 DIAGNOSIS — E538 Deficiency of other specified B group vitamins: Secondary | ICD-10-CM

## 2021-04-24 DIAGNOSIS — Z23 Encounter for immunization: Secondary | ICD-10-CM

## 2021-04-24 DIAGNOSIS — E785 Hyperlipidemia, unspecified: Secondary | ICD-10-CM | POA: Insufficient documentation

## 2021-04-24 DIAGNOSIS — Z1231 Encounter for screening mammogram for malignant neoplasm of breast: Secondary | ICD-10-CM

## 2021-04-24 DIAGNOSIS — Z0001 Encounter for general adult medical examination with abnormal findings: Secondary | ICD-10-CM

## 2021-04-24 DIAGNOSIS — I10 Essential (primary) hypertension: Secondary | ICD-10-CM

## 2021-04-24 DIAGNOSIS — E559 Vitamin D deficiency, unspecified: Secondary | ICD-10-CM | POA: Diagnosis not present

## 2021-04-24 DIAGNOSIS — R5383 Other fatigue: Secondary | ICD-10-CM | POA: Diagnosis not present

## 2021-04-24 DIAGNOSIS — E782 Mixed hyperlipidemia: Secondary | ICD-10-CM

## 2021-04-24 DIAGNOSIS — Z13820 Encounter for screening for osteoporosis: Secondary | ICD-10-CM

## 2021-04-24 DIAGNOSIS — H6121 Impacted cerumen, right ear: Secondary | ICD-10-CM | POA: Diagnosis not present

## 2021-04-24 LAB — COMPREHENSIVE METABOLIC PANEL
ALT: 10 U/L (ref 0–35)
AST: 15 U/L (ref 0–37)
Albumin: 4 g/dL (ref 3.5–5.2)
Alkaline Phosphatase: 77 U/L (ref 39–117)
BUN: 21 mg/dL (ref 6–23)
CO2: 30 mEq/L (ref 19–32)
Calcium: 9.7 mg/dL (ref 8.4–10.5)
Chloride: 102 mEq/L (ref 96–112)
Creatinine, Ser: 0.9 mg/dL (ref 0.40–1.20)
GFR: 62.25 mL/min (ref >60.00)
Glucose, Bld: 127 mg/dL — ABNORMAL HIGH (ref 70–99)
Potassium: 4.1 mEq/L (ref 3.5–5.1)
Sodium: 140 mEq/L (ref 135–145)
Total Bilirubin: 0.9 mg/dL (ref 0.2–1.2)
Total Protein: 7.3 g/dL (ref 6.0–8.3)

## 2021-04-24 LAB — LIPID PANEL
Cholesterol: 251 mg/dL — ABNORMAL HIGH (ref 0–200)
HDL: 66.2 mg/dL (ref >39.00)
LDL Cholesterol: 166 mg/dL — ABNORMAL HIGH (ref 0–99)
NonHDL: 184.55
Total CHOL/HDL Ratio: 4
Triglycerides: 94 mg/dL (ref 0.0–149.0)
VLDL: 18.8 mg/dL (ref 0.0–40.0)

## 2021-04-24 LAB — VITAMIN B12: Vitamin B-12: 247 pg/mL (ref 211–911)

## 2021-04-24 LAB — VITAMIN D 25 HYDROXY (VIT D DEFICIENCY, FRACTURES): VITD: 59.21 ng/mL (ref 30.00–100.00)

## 2021-04-24 LAB — TSH: TSH: 0.13 u[IU]/mL — ABNORMAL LOW (ref 0.35–5.50)

## 2021-04-24 MED ORDER — ATORVASTATIN CALCIUM 20 MG PO TABS: 20.0000 mg | ORAL_TABLET | Freq: Every day | ORAL | 1 refills | Status: DC

## 2021-04-24 MED ORDER — ALPRAZOLAM 0.5 MG PO TABS: 0.5000 mg | ORAL_TABLET | Freq: Two times a day (BID) | ORAL | 1 refills | Status: DC

## 2021-04-24 MED ORDER — LEVOTHYROXINE SODIUM 88 MCG PO TABS: 88.0000 ug | ORAL_TABLET | Freq: Every day | ORAL | 1 refills | Status: DC

## 2021-04-24 NOTE — Progress Notes (Signed)
Established Patient Office Visit     This visit occurred during the SARS-CoV-2 public health emergency.  Safety protocols were in place, including screening questions prior to the visit, additional usage of staff PPE, and extensive cleaning of exam room while observing appropriate contact time as indicated for disinfecting solutions.    CC/Reason for Visit: Annual preventive exam and subsequent Medicare wellness visit, discuss an acute concern  HPI: Julie Duffy is a 75 y.o. female who is coming in today for the above mentioned reasons. Past Medical History is significant for: Hypertension that has been well controlled, hypothyroidism on levothyroxine replacement, recently diagnosed vitamin D and B12 deficiency.  She has been complaining of some decreased hearing loss out of her right ear.  She has routine eye care but no dental care, she does not exercise routinely.  She is overdue for mammogram.  She had a colonoscopy in 2018 and was advised that was her last one.  She is overdue for pneumonia, flu, Tdap, shingles, COVID booster.   Past Medical/Surgical History: Past Medical History:  Diagnosis Date   Allergy    seasonal   Anxiety    Constipation    COVID-19 virus infection 09/2019   H. pylori infection    Hypertension    Hypothyroid    Scoliosis     Past Surgical History:  Procedure Laterality Date   APPENDECTOMY     NASAL SINUS SURGERY     NECK LESION BIOPSY     cysts benign   TUBAL LIGATION     UMBILICAL HERNIA REPAIR      Social History:  reports that she has never smoked. She has never used smokeless tobacco. She reports that she does not drink alcohol and does not use drugs.  Allergies: Allergies  Allergen Reactions   Guaifenesin & Derivatives Nausea And Vomiting   Clindamycin Other (See Comments)    GI intolerance   Clindamycin/Lincomycin Cross Reactors Other (See Comments)    GI intolerance   Codeine Other (See Comments)    hallucinations   Sulfa  Antibiotics Other (See Comments)    unspecified    Family History:  Family History  Problem Relation Age of Onset   Cancer Mother 72       colon ca   Heart disease Mother    Colon cancer Mother    Thyroid disease Mother    Cancer Father    Thyroid disease Daughter    Esophageal cancer Neg Hx    Stomach cancer Neg Hx      Current Outpatient Medications:    levothyroxine (SYNTHROID) 100 MCG tablet, TAKE 1 TABLET BY MOUTH EVERY DAY, Disp: 90 tablet, Rfl: 0   ALPRAZolam (XANAX) 0.5 MG tablet, Take 1 tablet (0.5 mg total) by mouth 2 (two) times daily., Disp: 90 tablet, Rfl: 1  Review of Systems:  Constitutional: Denies fever, chills, diaphoresis, appetite change and fatigue.  HEENT: Denies photophobia, eye pain, redness,  ear pain, congestion, sore throat, rhinorrhea, sneezing, mouth sores, trouble swallowing, neck pain, neck stiffness and tinnitus.   Respiratory: Denies SOB, DOE, cough, chest tightness,  and wheezing.   Cardiovascular: Denies chest pain, palpitations and leg swelling.  Gastrointestinal: Denies nausea, vomiting, abdominal pain, diarrhea, constipation, blood in stool and abdominal distention.  Genitourinary: Denies dysuria, urgency, frequency, hematuria, flank pain and difficulty urinating.  Endocrine: Denies: hot or cold intolerance, sweats, changes in hair or nails, polyuria, polydipsia. Musculoskeletal: Denies myalgias, back pain, joint swelling, arthralgias and gait problem.  Skin: Denies pallor, rash and wound.  Neurological: Denies dizziness, seizures, syncope, weakness, light-headedness, numbness and headaches.  Hematological: Denies adenopathy. Easy bruising, personal or family bleeding history  Psychiatric/Behavioral: Denies suicidal ideation, mood changes, confusion, nervousness, sleep disturbance and agitation    Physical Exam: Vitals:   04/24/21 0912  BP: 120/84  Pulse: 70  Temp: 97.7 F (36.5 C)  TempSrc: Oral  SpO2: 97%  Weight: 127 lb 12.8 oz  (58 kg)  Height: 5\' 5"  (1.651 m)    Body mass index is 21.27 kg/m.   Constitutional: NAD, calm, comfortable Eyes: PERRL, lids and conjunctivae normal ENMT: Mucous membranes are moist. Posterior pharynx clear of any exudate or lesions. Normal dentition. Tympanic membrane is pearly white, no erythema or bulging on the left, right is obstructed by cerumen Neck: normal, supple, no masses, no thyromegaly Respiratory: clear to auscultation bilaterally, no wheezing, no crackles. Normal respiratory effort. No accessory muscle use.  Cardiovascular: Regular rate and rhythm, no murmurs / rubs / gallops. No extremity edema. 2+ pedal pulses. No carotid bruits.  Abdomen: no tenderness, no masses palpated. No hepatosplenomegaly. Bowel sounds positive.  Musculoskeletal: no clubbing / cyanosis. No joint deformity upper and lower extremities. Good ROM, no contractures. Normal muscle tone.  Skin: no rashes, lesions, ulcers. No induration Neurologic: CN 2-12 grossly intact. Sensation intact, DTR normal. Strength 5/5 in all 4.  Psychiatric: Normal judgment and insight. Alert and oriented x 3. Normal mood.    Subsequent Medicare wellness visit   1. Risk factors, based on past  M,S,F -cardiovascular disease risk factors include age and hypertension   2.  Physical activities: She is still working, however she does not do formal exercise   3.  Depression/mood: Stable, not depressed   4.  Hearing: Some decreased hearing out of her right ear   5.  ADL's: Independent in all ADLs   6.  Fall risk: Low fall risk   7.  Home safety: No problems identified   8.  Height weight, and visual acuity: height and weight as above, vision:  Vision Screening   Right eye Left eye Both eyes  Without correction 20/32 20/32 20/25   With correction        9.  Counseling: Advise she update her vaccination status   10. Lab orders based on risk factors: Laboratory update will be reviewed   11. Referral : None today    12. Care plan: Follow-up with me in 6 months   13. Cognitive assessment: No cognitive impairment   14. Screening: Patient provided with a written and personalized 5-10 year screening schedule in the AVS. yes   15. Provider List Update: PCP only  16. Advance Directives: Full code   17. Opioids: Patient is not on any opioid prescriptions and has no risk factors for a substance use disorder.   Flowsheet Row Telemedicine from 11/30/2019 in [facility] [place]  PHQ-9 Total Score 2       Fall Risk  04/24/2021 11/30/2019 07/23/2018 12/08/2014  Falls in the past year? 0 0 0 No  Number falls in past yr: 0 0 0 -  Injury with Fall? 0 0 0 -     Impression and Plan:  Encounter for preventive health examination  Fatigue, unspecified type - Plan: ALPRAZolam (XANAX) 0.5 MG tablet  Vitamin D deficiency - Plan: DG Bone Density, VITAMIN D 25 Hydroxy (Vit-D Deficiency, Fractures)  Screening for osteoporosis - Plan: DG Bone Density  Right ear impacted cerumen  Essential hypertension -  Plan: Comprehensive metabolic panel, Lipid panel  Acquired hypothyroidism - Plan: TSH  Vitamin B12 deficiency - Plan: Vitamin B12  Need for influenza vaccination  Need for vaccination against Streptococcus pneumoniae  -Advised routine eye and dental care. -Flu and PCV 20 administered today.  She will get Tdap, shingles, COVID booster at pharmacy. -Mammogram and DEXA scan will be requested. -She had a colonoscopy in 2018 and was advised that was her last one. -Labs will be updated today. -Healthy lifestyle discussed in detail. -Cerumen Desimpaction  After patient consent was obtained, warm water was applied and gentle ear lavage performed on right ears. There were no complications and following the desimpaction the tympanic membranes were visible. Tympanic membranes are intact following the procedure. Auditory canals are normal. The patient reported relief of symptoms after removal of  cerumen.     Patient Instructions  -Nice seeing you today!!  -Lab work today; will notify you once results are available.  -Flu and Pneumonia vaccines today.  -Remember COVID booster, shingles and tetanus vaccines at the pharmacy.  -Schedule follow up in 6 months.   Chaya Jan, MD [facility] at [facility]

## 2021-04-24 NOTE — Patient Instructions (Signed)
-  Nice seeing you today!!  -Lab work today; will notify you once results are available.  -Flu and Pneumonia vaccines today.  -Remember COVID booster, shingles and tetanus vaccines at the pharmacy.  -Schedule follow up in 6 months.

## 2021-04-24 NOTE — Addendum Note (Signed)
Addended by: Kern Reap B on: 04/24/2021 04:48 PM   Modules accepted: Orders

## 2021-04-27 ENCOUNTER — Other Ambulatory Visit: Payer: Self-pay | Admitting: *Deleted

## 2021-04-27 DIAGNOSIS — E785 Hyperlipidemia, unspecified: Secondary | ICD-10-CM

## 2021-04-27 DIAGNOSIS — E039 Hypothyroidism, unspecified: Secondary | ICD-10-CM

## 2021-04-27 MED ORDER — LEVOTHYROXINE SODIUM 88 MCG PO TABS: 88.0000 ug | ORAL_TABLET | Freq: Every day | ORAL | 1 refills | Status: DC

## 2021-05-07 ENCOUNTER — Telehealth: Payer: Self-pay | Admitting: Internal Medicine

## 2021-05-07 NOTE — Telephone Encounter (Signed)
Pt call and stated she want a call back to talk about her Cholesterol.

## 2021-05-09 NOTE — Telephone Encounter (Signed)
Left message on machine for patient returning her call 

## 2021-05-11 ENCOUNTER — Telehealth: Payer: Self-pay

## 2021-05-11 NOTE — Telephone Encounter (Signed)
Patient called and was informed of results verbalized understanding pt did not want to schedule lab appt but will call back to schedule.  Pt stated that she had some heart  palpation and would like to know if dose change of levothyroxine (SYNTHROID) 88 MCG tablet could have caused it pt would like a call back.

## 2021-05-11 NOTE — Telephone Encounter (Signed)
Patient is aware of her lab results 

## 2021-06-15 ENCOUNTER — Ambulatory Visit (INDEPENDENT_AMBULATORY_CARE_PROVIDER_SITE_OTHER): Payer: Medicare HMO | Admitting: Internal Medicine

## 2021-06-15 ENCOUNTER — Encounter: Payer: Self-pay | Admitting: Internal Medicine

## 2021-06-15 VITALS — BP 130/80 | HR 63 | Temp 98.0°F | Wt 125.9 lb

## 2021-06-15 DIAGNOSIS — R319 Hematuria, unspecified: Secondary | ICD-10-CM

## 2021-06-15 DIAGNOSIS — N10 Acute tubulo-interstitial nephritis: Secondary | ICD-10-CM

## 2021-06-15 LAB — URINALYSIS
Bilirubin Urine: NEGATIVE
Ketones, ur: NEGATIVE
Leukocytes,Ua: NEGATIVE
Nitrite: NEGATIVE
Specific Gravity, Urine: 1.015 (ref 1.000–1.030)
Total Protein, Urine: NEGATIVE
Urine Glucose: NEGATIVE
Urobilinogen, UA: 0.2 (ref 0.0–1.0)
pH: 6 (ref 5.0–8.0)

## 2021-06-15 LAB — POCT URINALYSIS DIPSTICK
Bilirubin, UA: NEGATIVE
Blood, UA: POSITIVE
Glucose, UA: NEGATIVE
Ketones, UA: NEGATIVE
Leukocytes, UA: NEGATIVE
Nitrite, UA: NEGATIVE
Protein, UA: NEGATIVE
Spec Grav, UA: 1.02 (ref 1.010–1.025)
Urobilinogen, UA: 0.2 E.U./dL
pH, UA: 6 (ref 5.0–8.0)

## 2021-06-15 MED ORDER — CIPROFLOXACIN HCL 500 MG PO TABS: 500.0000 mg | ORAL_TABLET | Freq: Two times a day (BID) | ORAL | 0 refills | Status: AC

## 2021-06-15 NOTE — Progress Notes (Signed)
Acute office Visit     This visit occurred during the SARS-CoV-2 public health emergency.  Safety protocols were in place, including screening questions prior to the visit, additional usage of staff PPE, and extensive cleaning of exam room while observing appropriate contact time as indicated for disinfecting solutions.    CC/Reason for Visit: Urinary symptoms  HPI: Julie Duffy is a 75 y.o. female who is coming in today for the above mentioned reasons.  For a few days she has been having urinary frequency, hesitancy which has increased now to include left flank pain, nausea without emesis, abdominal cramping and decreased appetite.  She does not have a fever.  This is reminiscent of prior urinary tract infections  Past Medical/Surgical History: Past Medical History:  Diagnosis Date   Allergy    seasonal   Anxiety    Constipation    COVID-19 virus infection 09/2019   H. pylori infection    Hypertension    Hypothyroid    Scoliosis     Past Surgical History:  Procedure Laterality Date   APPENDECTOMY     NASAL SINUS SURGERY     NECK LESION BIOPSY     cysts benign   TUBAL LIGATION     UMBILICAL HERNIA REPAIR      Social History:  reports that she has never smoked. She has never used smokeless tobacco. She reports that she does not drink alcohol and does not use drugs.  Allergies: Allergies  Allergen Reactions   Guaifenesin & Derivatives Nausea And Vomiting   Clindamycin Other (See Comments)    GI intolerance   Clindamycin/Lincomycin Cross Reactors Other (See Comments)    GI intolerance   Codeine Other (See Comments)    hallucinations   Sulfa Antibiotics Other (See Comments)    unspecified    Family History:  Family History  Problem Relation Age of Onset   Cancer Mother 41       colon ca   Heart disease Mother    Colon cancer Mother    Thyroid disease Mother    Cancer Father    Thyroid disease Daughter    Esophageal cancer Neg Hx    Stomach cancer  Neg Hx      Current Outpatient Medications:    ALPRAZolam (XANAX) 0.5 MG tablet, Take 1 tablet (0.5 mg total) by mouth 2 (two) times daily., Disp: 90 tablet, Rfl: 1   atorvastatin (LIPITOR) 20 MG tablet, Take 1 tablet (20 mg total) by mouth daily., Disp: 90 tablet, Rfl: 1   ciprofloxacin (CIPRO) 500 MG tablet, Take 1 tablet (500 mg total) by mouth 2 (two) times daily for 7 days., Disp: 14 tablet, Rfl: 0   levothyroxine (SYNTHROID) 88 MCG tablet, Take 1 tablet (88 mcg total) by mouth daily., Disp: 90 tablet, Rfl: 1  Review of Systems:  Constitutional: Denies fever, chills, diaphoresis. HEENT: Denies photophobia, eye pain, redness, hearing loss, ear pain, congestion, sore throat, rhinorrhea, sneezing, mouth sores, trouble swallowing, neck pain, neck stiffness and tinnitus.   Respiratory: Denies SOB, DOE, cough, chest tightness,  and wheezing.   Cardiovascular: Denies chest pain, palpitations and leg swelling.  Gastrointestinal: Denies  vomiting,  diarrhea, constipation, blood in stool and abdominal distention.  Genitourinary: Positive for dysuria, urgency, frequency, hematuria, flank pain and difficulty urinating.  Endocrine: Denies: hot or cold intolerance, sweats, changes in hair or nails, polyuria, polydipsia. Musculoskeletal: Denies myalgias, back pain, joint swelling, arthralgias and gait problem.  Skin: Denies pallor, rash and wound.  Neurological: Denies dizziness, seizures, syncope, weakness, light-headedness, numbness and headaches.  Hematological: Denies adenopathy. Easy bruising, personal or family bleeding history  Psychiatric/Behavioral: Denies suicidal ideation, mood changes, confusion, nervousness, sleep disturbance and agitation    Physical Exam: Vitals:   06/15/21 1012  BP: 130/80  Pulse: 63  Temp: 98 F (36.7 C)  TempSrc: Oral  SpO2: 96%  Weight: 125 lb 14.4 oz (57.1 kg)    Body mass index is 20.95 kg/m.   Constitutional: NAD, calm, comfortable Eyes: PERRL,  lids and conjunctivae normal ENMT: Mucous membranes are moist.   Psychiatric: Normal judgment and insight. Alert and oriented x 3. Normal mood.    Impression and Plan:  Acute pyelonephritis  Hematuria, unspecified type  -In office dipstick is positive for blood, no nitrates. -I will treat with a 7-day course of Cipro given her Bactrim allergy. -If symptoms persist will consider imaging.  Sent for urine analysis and culture.     Chaya Jan, MD [place] [facility] at [facility]

## 2021-06-15 NOTE — Addendum Note (Signed)
Addended by: Marian Sorrow D on: 06/15/2021 11:21 AM   Modules accepted: Orders

## 2021-06-17 LAB — URINE CULTURE
MICRO NUMBER:: 12626451
Result:: NO GROWTH
SPECIMEN QUALITY:: ADEQUATE

## 2021-07-05 ENCOUNTER — Ambulatory Visit: Payer: Medicare HMO | Admitting: Family Medicine

## 2021-07-12 ENCOUNTER — Ambulatory Visit (INDEPENDENT_AMBULATORY_CARE_PROVIDER_SITE_OTHER): Payer: Medicare HMO | Admitting: Internal Medicine

## 2021-07-12 ENCOUNTER — Encounter: Payer: Self-pay | Admitting: Internal Medicine

## 2021-07-12 VITALS — BP 130/90 | Temp 97.6°F | Wt 125.8 lb

## 2021-07-12 DIAGNOSIS — N029 Recurrent and persistent hematuria with unspecified morphologic changes: Secondary | ICD-10-CM

## 2021-07-12 DIAGNOSIS — R300 Dysuria: Secondary | ICD-10-CM | POA: Diagnosis not present

## 2021-07-12 DIAGNOSIS — E782 Mixed hyperlipidemia: Secondary | ICD-10-CM | POA: Diagnosis not present

## 2021-07-12 DIAGNOSIS — R319 Hematuria, unspecified: Secondary | ICD-10-CM | POA: Diagnosis not present

## 2021-07-12 LAB — URINALYSIS, ROUTINE W REFLEX MICROSCOPIC
Bilirubin Urine: NEGATIVE
Ketones, ur: NEGATIVE
Leukocytes,Ua: NEGATIVE
Nitrite: NEGATIVE
Specific Gravity, Urine: 1.015 (ref 1.000–1.030)
Total Protein, Urine: NEGATIVE
Urine Glucose: NEGATIVE
Urobilinogen, UA: 0.2 (ref 0.0–1.0)
pH: 6 (ref 5.0–8.0)

## 2021-07-12 LAB — POCT URINALYSIS DIPSTICK
Bilirubin, UA: NEGATIVE
Blood, UA: POSITIVE
Glucose, UA: NEGATIVE
Ketones, UA: NEGATIVE
Leukocytes, UA: NEGATIVE
Nitrite, UA: NEGATIVE
Protein, UA: NEGATIVE
Spec Grav, UA: 1.02 (ref 1.010–1.025)
Urobilinogen, UA: 0.2 E.U./dL
pH, UA: 5 (ref 5.0–8.0)

## 2021-07-12 MED ORDER — ATORVASTATIN CALCIUM 20 MG PO TABS
20.0000 mg | ORAL_TABLET | Freq: Every day | ORAL | 1 refills | Status: DC
Start: 2021-07-12 — End: 2022-02-01

## 2021-07-12 NOTE — Patient Instructions (Signed)
-  Nice seeing you today!!  -CT scan and referral to urology has been placed.  -Please start atorvastatin 20 mg daily for your cholesterol.

## 2021-07-12 NOTE — Progress Notes (Signed)
Established Patient Office Visit     This visit occurred during the SARS-CoV-2 public health emergency.  Safety protocols were in place, including screening questions prior to the visit, additional usage of staff PPE, and extensive cleaning of exam room while observing appropriate contact time as indicated for disinfecting solutions.    CC/Reason for Visit: Frequency, dark urine  HPI: Julie Duffy is a 75 y.o. female who is coming in today for the above mentioned reasons.  She was found to have hematuria in November and treated as a possible UTI with antibiotics, however culture showed no growth.  She returns today with dark color in her urine and increased frequency at nighttime.  She denies dysuria or flank pain.  She was prescribed atorvastatin 20 mg following a cholesterol panel drawn in September which she has not started yet  Past Medical/Surgical History: Past Medical History:  Diagnosis Date   Allergy    seasonal   Anxiety    Constipation    COVID-19 virus infection 09/2019   H. pylori infection    Hypertension    Hypothyroid    Scoliosis     Past Surgical History:  Procedure Laterality Date   APPENDECTOMY     NASAL SINUS SURGERY     NECK LESION BIOPSY     cysts benign   TUBAL LIGATION     UMBILICAL HERNIA REPAIR      Social History:  reports that she has never smoked. She has never used smokeless tobacco. She reports that she does not drink alcohol and does not use drugs.  Allergies: Allergies  Allergen Reactions   Guaifenesin & Derivatives Nausea And Vomiting   Clindamycin Other (See Comments)    GI intolerance   Clindamycin/Lincomycin Cross Reactors Other (See Comments)    GI intolerance   Codeine Other (See Comments)    hallucinations   Sulfa Antibiotics Other (See Comments)    unspecified    Family History:  Family History  Problem Relation Age of Onset   Cancer Mother 12       colon ca   Heart disease Mother    Colon cancer Mother     Thyroid disease Mother    Cancer Father    Thyroid disease Daughter    Esophageal cancer Neg Hx    Stomach cancer Neg Hx      Current Outpatient Medications:    ALPRAZolam (XANAX) 0.5 MG tablet, Take 1 tablet (0.5 mg total) by mouth 2 (two) times daily., Disp: 90 tablet, Rfl: 1   levothyroxine (SYNTHROID) 88 MCG tablet, Take 1 tablet (88 mcg total) by mouth daily., Disp: 90 tablet, Rfl: 1   atorvastatin (LIPITOR) 20 MG tablet, Take 1 tablet (20 mg total) by mouth daily., Disp: 90 tablet, Rfl: 1  Review of Systems:  Constitutional: Denies fever, chills, diaphoresis, appetite change and fatigue.  HEENT: Denies photophobia, eye pain, redness, hearing loss, ear pain, congestion, sore throat, rhinorrhea, sneezing, mouth sores, trouble swallowing, neck pain, neck stiffness and tinnitus.   Respiratory: Denies SOB, DOE, cough, chest tightness,  and wheezing.   Cardiovascular: Denies chest pain, palpitations and leg swelling.  Gastrointestinal: Denies nausea, vomiting, abdominal pain, diarrhea, constipation, blood in stool and abdominal distention.  Genitourinary: Denies dysuria,  flank pain and difficulty urinating.  Endocrine: Denies: hot or cold intolerance, sweats, changes in hair or nails, polyuria, polydipsia. Musculoskeletal: Denies myalgias, back pain, joint swelling, arthralgias and gait problem.  Skin: Denies pallor, rash and wound.  Neurological: Denies  dizziness, seizures, syncope, weakness, light-headedness, numbness and headaches.  Hematological: Denies adenopathy. Easy bruising, personal or family bleeding history  Psychiatric/Behavioral: Denies suicidal ideation, mood changes, confusion, nervousness, sleep disturbance and agitation    Physical Exam: Vitals:   07/12/21 0710  BP: 130/90  Temp: 97.6 F (36.4 C)  TempSrc: Oral  Weight: 125 lb 12.8 oz (57.1 kg)    Body mass index is 20.93 kg/m.   Constitutional: NAD, calm, comfortable Eyes: PERRL, lids and  conjunctivae normal ENMT: Mucous membranes are moist.  Neurologic: Grossly intact and nonfocal Psychiatric: Normal judgment and insight. Alert and oriented x 3. Normal mood.    Impression and Plan:  Dysuria - Plan: POCT urinalysis dipstick, Culture, Urine, Urinalysis  Persistent hematuria  - Plan: Culture, Urine, Urinalysis, CT RENAL STONE STUDY, Ambulatory referral to Urology -In office dipstick today again shows hematuria. -Sent for CT scan, refer to urology, may need cystoscopy. -Will withhold antibiotics pending culture.  Mixed hyperlipidemia  - Plan: atorvastatin (LIPITOR) 20 MG tablet -Recheck lipids in 3 months.  Time spent: 31 minutes reviewing chart, interviewing and examining patient and formulating plan of care.   Patient Instructions  -Nice seeing you today!!  -CT scan and referral to urology has been placed.  -Please start atorvastatin 20 mg daily for your cholesterol.    Lelon Frohlich, MD [facility] at [facility]

## 2021-07-13 LAB — URINE CULTURE
MICRO NUMBER:: 12731849
Result:: NO GROWTH
SPECIMEN QUALITY:: ADEQUATE

## 2021-08-02 DIAGNOSIS — R3121 Asymptomatic microscopic hematuria: Secondary | ICD-10-CM | POA: Diagnosis not present

## 2021-09-05 ENCOUNTER — Telehealth: Payer: Self-pay | Admitting: Internal Medicine

## 2021-09-05 ENCOUNTER — Encounter: Payer: Self-pay | Admitting: Family Medicine

## 2021-09-05 ENCOUNTER — Telehealth (INDEPENDENT_AMBULATORY_CARE_PROVIDER_SITE_OTHER): Payer: Medicare HMO | Admitting: Family Medicine

## 2021-09-05 VITALS — Ht 65.0 in

## 2021-09-05 DIAGNOSIS — J029 Acute pharyngitis, unspecified: Secondary | ICD-10-CM

## 2021-09-05 DIAGNOSIS — J069 Acute upper respiratory infection, unspecified: Secondary | ICD-10-CM

## 2021-09-05 LAB — POCT RAPID STREP A (OFFICE): Rapid Strep A Screen: NEGATIVE

## 2021-09-05 LAB — POC INFLUENZA A&B (BINAX/QUICKVUE)
Influenza A, POC: NEGATIVE
Influenza B, POC: NEGATIVE

## 2021-09-05 MED ORDER — BENZONATATE 100 MG PO CAPS: 100.0000 mg | ORAL_CAPSULE | Freq: Two times a day (BID) | ORAL | 0 refills | Status: AC | PRN

## 2021-09-05 NOTE — Progress Notes (Signed)
Virtual Visit via Telephone Note I connected with Julie Duffy on 09/05/21 at  2:30 PM EST by telephone and verified that I am speaking with the correct person using two identifiers.   I discussed the limitations, risks, security and privacy concerns of performing an evaluation and management service by telephone and the availability of in person appointments. I also discussed with the patient that there may be a patient responsible charge related to this service. The patient expressed understanding and agreed to proceed.  Location patient: home Location provider: work office Participants present for the call: patient, boyfriend,provider Patient did not have a visit in the prior 7 days to address this/these issue(s).  Chief Complaint  Patient presents with   flu like symptoms    Runny nose, sore throat, sneezing. Had chills & fever 2 days ago. Has lost voice.     History of Present Illness: Julie Duffy is a 76 y.o.female with hx of vertigo, vitamin D deficiency, IBS, hypothyroidism, hypertension, and anxiety c/o 2 days of respiratory symptoms as described above. Because of dysphonia, her boyfriend helps with interrogation, he is on the phone. Fatigue, headache, sinus pressure, nasal congestion,rhinorrhea, and productive cough with clear sputum. Subjective fever. Negative for hemoptysis.  She had noted a white spot on left side of her throat. Negative for anosmia,ageusia,CP, dyspnea, wheezing,stridor, abdominal pain, nausea, vomiting, changes in bowel habits, urinary symptoms, body aches, or skin rash.  No known contact with flu or strep. Her boyfriend has some upper respiratory symptoms but no fever.  Home COVID-19 test yesterday was negative. COVID-19 and flu vaccination completed. She has taken Tylenol and Mucinex.  Observations/Objective: Patient sounds cheerful and well on the phone. I do not appreciate any SOB,cough,or wheezing. Speech and thought processing are grossly  intact. Patient reported vitals:Ht 5\' 5"  (1.651 m)    BMI 20.93 kg/m  She came to the clinic back parking lot for flu and rapid strep testing, pharyngeal exam negative for erythema,edema,or exudate. + mild to moderate dysphonia.  Assessment and Plan:  1. Sore throat Examination doe snot suggest a bacterial process. Rapid strep negative. Recommend symptomatic treatment with gargle with saline and follow lozenges.  - POC Rapid Strep A - POC Influenza A&B(BINAX/QUICKVUE)  2. URI, acute Symptoms suggests a viral etiology, I explained that symptomatic treatment is recommended in this case, so I do not think abx is needed at this time. Rapid flu test negative. Instructed to repeat home COVID 19 test tomorrow. Instructed to monitor for signs of complications. She needs to check temp. Voice rest. Benzonatate 100 mg bid prn to help with cough. Continue Tylenol 500 mg 3-4 times per day as needed add plain Mucinex.  Clearly instructed about warning signs. I also explained that cough and nasal congestion can last a few days and sometimes weeks. F/U as needed.  - POC Influenza A&B(BINAX/QUICKVUE)  Follow Up Instructions:  Return if symptoms worsen or fail to improve.  I did not refer this patient for an OV in the next 24 hours for this/these issue(s).  I discussed the assessment and treatment plan with the patient. The patient was provided an opportunity to ask questions and all were answered. The patient agreed with the plan and demonstrated an understanding of the instructions.   The patient was advised to call back or seek an in-person evaluation if the symptoms worsen or if the condition fails to improve as anticipated.  I provided  8 minutes of non-face-to-face time during this encounter.   G. [place], MD  [facility]. [place] office.

## 2021-09-05 NOTE — Telephone Encounter (Signed)
Patient calling in with respiratory symptoms: Shortness of breath, chest pain, palpitations or other red words send to Triage  Does the patient have a fever over 100, cough, congestion, sore throat, runny nose, lost of taste/smell (please list symptoms that patient has)sneezing, sore throat, no voice, runny nose   What date did symptoms start?around sun (If over 5 days ago, pt may be scheduled for in person visit)  Have you tested for Covid in the last 5 days? Yes   If yes, was it positive []  OR negative [x] ? If positive in the last 5 days, please schedule virtual visit now. If negative, schedule for an in person OV with the next available provider if PCP has no openings. Please also let patient know they will be tested again (follow the script below)  "you will have to arrive prior to your appt time to be Covid tested. Please [facility] in back of [facility] & call [phone] to let the staff know you have arrived. A staff member will meet you at your car to do a rapid covid test. Once the test has resulted you will be notified by phone of your results to determine if appt will remain an in person visit or be converted to a virtual/phone visit. If you arrive less than before your appt time, your visit will be automatically converted to virtual & any recommended testing will happen AFTER the visit." Pt has virtual with dr [phone] 09-05-2021 at 230 pm  THINGS TO REMEMBER  If no availability for virtual visit in office,  please schedule another [place] office  If no availability at another [place] office, please instruct patient that they can schedule an evisit or virtual visit through their mychart account. Visits up to 8pm  patients can be seen in office 5 days after positive COVID test

## 2021-09-07 ENCOUNTER — Ambulatory Visit (HOSPITAL_COMMUNITY)
Admission: EM | Admit: 2021-09-07 | Discharge: 2021-09-07 | Disposition: A | Discharge status: Home / Self Care | Payer: Medicare HMO | Attending: Family Medicine | Admitting: Family Medicine

## 2021-09-07 ENCOUNTER — Encounter (HOSPITAL_COMMUNITY): Payer: Self-pay | Admitting: Emergency Medicine

## 2021-09-07 ENCOUNTER — Ambulatory Visit (INDEPENDENT_AMBULATORY_CARE_PROVIDER_SITE_OTHER): Payer: Medicare HMO

## 2021-09-07 ENCOUNTER — Other Ambulatory Visit: Payer: Self-pay

## 2021-09-07 DIAGNOSIS — Z20822 Contact with and (suspected) exposure to covid-19: Secondary | ICD-10-CM | POA: Diagnosis not present

## 2021-09-07 DIAGNOSIS — J069 Acute upper respiratory infection, unspecified: Secondary | ICD-10-CM

## 2021-09-07 DIAGNOSIS — R059 Cough, unspecified: Secondary | ICD-10-CM

## 2021-09-07 DIAGNOSIS — H109 Unspecified conjunctivitis: Secondary | ICD-10-CM | POA: Diagnosis not present

## 2021-09-07 DIAGNOSIS — J029 Acute pharyngitis, unspecified: Secondary | ICD-10-CM

## 2021-09-07 DIAGNOSIS — J3489 Other specified disorders of nose and nasal sinuses: Secondary | ICD-10-CM | POA: Diagnosis not present

## 2021-09-07 LAB — SARS CORONAVIRUS 2 (TAT 6-24 HRS): SARS Coronavirus 2: NEGATIVE

## 2021-09-07 MED ORDER — MOXIFLOXACIN HCL 0.5 % OP SOLN: 1.0000 [drp] | Freq: Three times a day (TID) | OPHTHALMIC | 0 refills | Status: AC

## 2021-09-07 NOTE — ED Provider Notes (Signed)
[facility]    CSN: [id number] Arrival date & time: 09/07/21  0915      History   Chief Complaint Chief Complaint  Patient presents with   Nasal Congestion   Sore Throat   Cough    HPI Julie Duffy is a 76 y.o. female.   4 days ago she started with chills, sore throat, ear pain.  Then with sinus pain, pressure.  Last night she had burning in her nose, and today her eyes are bothering her.  She is having a cough, with burning in her chest.  Recently now having green sputum.  She did go to her PCP on 2/1, given tessalon without much help.  Had a negative flu, negative covid, and negative strep this week.  She did have chills/fever Tuesday night, but not since.   Past Medical History:  Diagnosis Date   Allergy    seasonal   Anxiety    Constipation    COVID-19 virus infection 09/2019   H. pylori infection    Hypertension    Hypothyroid    Scoliosis     Patient Active Problem List   Diagnosis Date Noted   Hyperlipidemia 04/24/2021   Vitamin D deficiency 01/19/2021   IBS (irritable colon syndrome) 01/20/2020   Osteoarthritis of spine with radiculopathy, cervical region 08/16/2016   Essential hypertension 09/28/2015   Atrophic vaginitis 03/17/2015   Urethral caruncle 03/17/2015   Chronic cystitis 02/27/2015   Gross hematuria 02/27/2015   Paresthesia and pain of extremity 09/02/2011   VITILIGO 01/25/2008   Hypothyroidism 07/16/2007    Past Surgical History:  Procedure Laterality Date   APPENDECTOMY     NASAL SINUS SURGERY     NECK LESION BIOPSY     cysts benign   TUBAL LIGATION     UMBILICAL HERNIA REPAIR      OB History   No obstetric history on file.      Home Medications    Prior to Admission medications   Medication Sig Start Date End Date Taking? Authorizing Provider  ALPRAZolam Duanne Moron) 0.5 MG tablet Take 1 tablet (0.5 mg total) by mouth 2 (two) times daily. 04/24/21   Isaac Bliss, Rayford Halsted, MD  atorvastatin (LIPITOR) 20 MG  tablet Take 1 tablet (20 mg total) by mouth daily. 07/12/21   Isaac Bliss, Rayford Halsted, MD  benzonatate (TESSALON) 100 MG capsule Take 1 capsule (100 mg total) by mouth 2 (two) times daily as needed for up to 10 days. 09/05/21 09/15/21  Martinique, Betty G, MD  levothyroxine (SYNTHROID) 88 MCG tablet Take 1 tablet (88 mcg total) by mouth daily. 04/27/21   Isaac Bliss, Rayford Halsted, MD    Family History Family History  Problem Relation Age of Onset   Cancer Mother 89       colon ca   Heart disease Mother    Colon cancer Mother    Thyroid disease Mother    Cancer Father    Thyroid disease Daughter    Esophageal cancer Neg Hx    Stomach cancer Neg Hx     Social History Social History   Tobacco Use   Smoking status: Never   Smokeless tobacco: Never  Vaping Use   Vaping Use: Never used  Substance Use Topics   Alcohol use: No    Alcohol/week: 0.0 standard drinks   Drug use: No     Allergies   Guaifenesin & derivatives, Clindamycin, Clindamycin/lincomycin cross reactors, Codeine, and Sulfa antibiotics   Review of Systems  Review of Systems  Constitutional:  Positive for chills, fatigue and fever.  HENT:  Positive for congestion, rhinorrhea, sinus pain and sore throat. Negative for ear pain.   Eyes:  Positive for discharge and redness.  Respiratory:  Positive for cough.   Cardiovascular: Negative.   Gastrointestinal: Negative.     Physical Exam Triage Vital Signs ED Triage Vitals  Enc Vitals Group     BP 09/07/21 0928 136/72     Pulse Rate 09/07/21 0928 (!) 58     Resp 09/07/21 0928 17     Temp 09/07/21 0928 98 F (36.7 C)     Temp src --      SpO2 09/07/21 0928 98 %     Weight --      Height --      Head Circumference --      Peak Flow --      Pain Score 09/07/21 0926 7     Pain Loc --      Pain Edu? --      Excl. in [place]? --    No data found.  Updated Vital Signs BP 136/72 (BP Location: Right Arm)    Pulse (!) 58    Temp 98 F (36.7 C)    Resp 17    SpO2 98%    Visual Acuity Right Eye Distance:   Left Eye Distance:   Bilateral Distance:    Right Eye Near:   Left Eye Near:    Bilateral Near:     Physical Exam Constitutional:      Appearance: She is well-developed.  HENT:     Head: Normocephalic and atraumatic.     Right Ear: Tympanic membrane normal.     Left Ear: Tympanic membrane normal.     Nose: Congestion and rhinorrhea present.     Mouth/Throat:     Mouth: Mucous membranes are moist.     Pharynx: Posterior oropharyngeal erythema present. No oropharyngeal exudate.  Eyes:     Conjunctiva/sclera:     Right eye: Right conjunctiva is injected. Exudate present.     Left eye: Left conjunctiva is injected. Exudate present.  Cardiovascular:     Rate and Rhythm: Normal rate and regular rhythm.  Pulmonary:     Effort: Pulmonary effort is normal.     Breath sounds: Rhonchi present.  Musculoskeletal:     Cervical back: Neck supple.  Neurological:     Mental Status: She is alert.     UC Treatments / Results  Labs (all labs ordered are listed, but only abnormal results are displayed) Labs Reviewed - No data to display  EKG   Radiology DG Chest 2 View  Result Date: 09/07/2021 CLINICAL DATA:  Cough, congestion and sore throat. EXAM: CHEST - 2 VIEW COMPARISON:  Radiographs 10/16/2017. FINDINGS: The heart size and mediastinal contours are stable. The lungs remain hyperinflated but clear. There is no pleural effusion or pneumothorax. Convex right thoracic scoliosis again noted without evidence of acute osseous abnormality. IMPRESSION: Stable radiographic appearance of the chest with suspicion of underlying chronic obstructive pulmonary disease. No acute findings identified. Electronically Signed   By: Richardean Sale M.D.   On: 09/07/2021 10:11    Procedures Procedures (including critical care time)  Medications Ordered in UC Medications - No data to display  Initial Impression / Assessment and Plan / UC Course  I have reviewed  the triage vital signs and the nursing notes.  Pertinent labs & imaging results that were available during my  care of the patient were reviewed by me and considered in my medical decision making (see chart for details).   Patient was seen today for URI symptoms.  Her chest xray was normal.  No evidence of bacterial infection.  I have given her eye drops for eye infection.  Otherwise she is to use over the counter medication for her symptoms.  Covid swab will be resulted tomorrow.  Please follow up with her pcp if she continues with symptoms past 7 days.   Final Clinical Impressions(s) / UC Diagnoses   Final diagnoses:  Conjunctivitis of both eyes, unspecified conjunctivitis type  Upper respiratory tract infection, unspecified type     Discharge Instructions      You were seen today for upper respiratory symptoms.  I have sent out eye drops for conjunctivitis.  Please wash your hands frequently to avoid spread of this.  Avoid wearing eye make up until symptoms have resolved.  Your chest xray today was normal.  No evidence of pneumonia.  You covid test is pending, this will be resulted tomorrow.  For now, your symptoms are likely viral in nature.  There has been a lot of this going around.  I do recommend over the counter flonase to help with nasal congestion and burning.  You should also take over the counter zyrtec to help with nasal congestion.  Since the benzoate has not been helpful, you may use over the counter mucinex, dayquil, or nyquil to help with your cough.     ED Prescriptions     Medication Sig Dispense Auth. Provider   moxifloxacin (VIGAMOX) 0.5 % ophthalmic solution Place 1 drop into both eyes 3 (three) times daily for 7 days. 3 mL Rondel Oh, MD      PDMP not reviewed this encounter.   Rondel Oh, MD 09/07/21 1027

## 2021-09-07 NOTE — ED Triage Notes (Signed)
Since Tuesday having congestion, sore throat that radiating to ears, cough, eye burning. Reports last night nose was burning so used saline spray.  Had nurse visit and covid, strep throat and flu test was negative on Wed.

## 2021-09-07 NOTE — Discharge Instructions (Addendum)
You were seen today for upper respiratory symptoms.  I have sent out eye drops for conjunctivitis.  Please wash your hands frequently to avoid spread of this.  Avoid wearing eye make up until symptoms have resolved.  Your chest xray today was normal.  No evidence of pneumonia.  You covid test is pending, this will be resulted tomorrow.  For now, your symptoms are likely viral in nature.  There has been a lot of this going around.  I do recommend over the counter flonase to help with nasal congestion and burning.  You should also take over the counter zyrtec to help with nasal congestion.  Since the benzoate has not been helpful, you may use over the counter mucinex, dayquil, or nyquil to help with your cough.

## 2021-10-18 DIAGNOSIS — H16223 Keratoconjunctivitis sicca, not specified as Sjogren's, bilateral: Secondary | ICD-10-CM | POA: Diagnosis not present

## 2021-10-18 DIAGNOSIS — H35372 Puckering of macula, left eye: Secondary | ICD-10-CM | POA: Diagnosis not present

## 2021-10-18 DIAGNOSIS — H524 Presbyopia: Secondary | ICD-10-CM | POA: Diagnosis not present

## 2021-10-18 DIAGNOSIS — H25813 Combined forms of age-related cataract, bilateral: Secondary | ICD-10-CM | POA: Diagnosis not present

## 2021-10-26 ENCOUNTER — Encounter (HOSPITAL_COMMUNITY): Payer: Self-pay

## 2021-10-26 ENCOUNTER — Ambulatory Visit (HOSPITAL_COMMUNITY)
Admission: EM | Admit: 2021-10-26 | Discharge: 2021-10-26 | Disposition: A | Discharge status: Home / Self Care | Payer: Medicare HMO | Attending: Internal Medicine | Admitting: Internal Medicine

## 2021-10-26 DIAGNOSIS — Z20822 Contact with and (suspected) exposure to covid-19: Secondary | ICD-10-CM | POA: Diagnosis not present

## 2021-10-26 DIAGNOSIS — R590 Localized enlarged lymph nodes: Secondary | ICD-10-CM | POA: Diagnosis not present

## 2021-10-26 LAB — POCT RAPID STREP A, ED / UC: Streptococcus, Group A Screen (Direct): NEGATIVE

## 2021-10-26 NOTE — ED Triage Notes (Signed)
Pt c/o knot to lt side of neck since last night. States has had 2 cyst removed from neck in the past.  ?

## 2021-10-26 NOTE — Discharge Instructions (Signed)
It appears that you have cervical lymph node swelling.  This is typically related to a viral infection.  Please use warm compresses to affected area.  Rapid strep was negative.  Throat culture and COVID test are pending.  We will call if they are positive.  Please follow-up with primary care doctor. ?

## 2021-10-26 NOTE — ED Provider Notes (Signed)
?[facility] ? ? ? ?CSN: [id number] ?Arrival date & time: 10/26/21  1817 ? ? ?  ? ?History   ?Chief Complaint ?Chief Complaint  ?Patient presents with  ? Cyst  ? ? ?HPI ?CARISA BACKHAUS is a 76 y.o. female.  ? ?Patient presents with "knot" to the left side of the neck that she noticed last night.  Denies any pain or drainage from the area.  She does report that she has had 2 cysts removed to the lower portion of her neck in a different location in the past so she is not sure if this is the cause.  Denies any associated upper respiratory symptoms, fever, sore throat, ear pain.  Has full range of motion of neck. ? ? ? ?Past Medical History:  ?Diagnosis Date  ? Allergy   ? seasonal  ? Anxiety   ? Constipation   ? COVID-19 virus infection 09/2019  ? H. pylori infection   ? Hypertension   ? Hypothyroid   ? Scoliosis   ? ? ?Patient Active Problem List  ? Diagnosis Date Noted  ? Hyperlipidemia 04/24/2021  ? Vitamin D deficiency 01/19/2021  ? IBS (irritable colon syndrome) 01/20/2020  ? Osteoarthritis of spine with radiculopathy, cervical region 08/16/2016  ? Essential hypertension 09/28/2015  ? Atrophic vaginitis 03/17/2015  ? Urethral caruncle 03/17/2015  ? Chronic cystitis 02/27/2015  ? Gross hematuria 02/27/2015  ? Paresthesia and pain of extremity 09/02/2011  ? VITILIGO 01/25/2008  ? Hypothyroidism 07/16/2007  ? ? ?Past Surgical History:  ?Procedure Laterality Date  ? APPENDECTOMY    ? NASAL SINUS SURGERY    ? NECK LESION BIOPSY    ? cysts benign  ? TUBAL LIGATION    ? UMBILICAL HERNIA REPAIR    ? ? ?OB History   ?No obstetric history on file. ?  ? ? ? ?Home Medications   ? ?Prior to Admission medications   ?Medication Sig Start Date End Date Taking? Authorizing Provider  ?ALPRAZolam (XANAX) 0.5 MG tablet Take 1 tablet (0.5 mg total) by mouth 2 (two) times daily. 04/24/21   Philip Aspen, Limmie Patricia, MD  ?atorvastatin (LIPITOR) 20 MG tablet Take 1 tablet (20 mg total) by mouth daily. 07/12/21   Philip Aspen, Limmie Patricia, MD  ?levothyroxine (SYNTHROID) 88 MCG tablet Take 1 tablet (88 mcg total) by mouth daily. 04/27/21   Philip Aspen, Limmie Patricia, MD  ? ? ?Family History ?Family History  ?Problem Relation Age of Onset  ? Cancer Mother 64  ?     colon ca  ? Heart disease Mother   ? Colon cancer Mother   ? Thyroid disease Mother   ? Cancer Father   ? Thyroid disease Daughter   ? Esophageal cancer Neg Hx   ? Stomach cancer Neg Hx   ? ? ?Social History ?Social History  ? ?Tobacco Use  ? Smoking status: Never  ? Smokeless tobacco: Never  ?Vaping Use  ? Vaping Use: Never used  ?Substance Use Topics  ? Alcohol use: No  ?  Alcohol/week: 0.0 standard drinks  ? Drug use: No  ? ? ? ?Allergies   ?Guaifenesin & derivatives, Clindamycin, Clindamycin/lincomycin cross reactors, Codeine, and Sulfa antibiotics ? ? ?Review of Systems ?Review of Systems ?Per HPI ? ?Physical Exam ?Triage Vital Signs ?ED Triage Vitals  ?Enc Vitals Group  ?   BP 10/26/21 1919 (!) 150/81  ?   Pulse Rate 10/26/21 1919 63  ?   Resp 10/26/21 1919 18  ?  Temp 10/26/21 1919 98.1 ?F (36.7 ?C)  ?   Temp Source 10/26/21 1919 Oral  ?   SpO2 10/26/21 1919 99 %  ?   Weight --   ?   Height --   ?   Head Circumference --   ?   Peak Flow --   ?   Pain Score 10/26/21 1903 2  ?   Pain Loc --   ?   Pain Edu? --   ?   Excl. in [place]? --   ? ?No data found. ? ?Updated Vital Signs ?BP (!) 150/81 (BP Location: Left Arm)   Pulse 63   Temp 98.1 ?F (36.7 ?C) (Oral)   Resp 18   SpO2 99%  ? ?Visual Acuity ?Right Eye Distance:   ?Left Eye Distance:   ?Bilateral Distance:   ? ?Right Eye Near:   ?Left Eye Near:    ?Bilateral Near:    ? ?Physical Exam ?Constitutional:   ?   General: She is not in acute distress. ?   Appearance: Normal appearance. She is not toxic-appearing or diaphoretic.  ?HENT:  ?   Head: Normocephalic and atraumatic.  ?   Right Ear: Tympanic membrane and ear canal normal.  ?   Left Ear: Tympanic membrane and ear canal normal.  ?   Nose: Nose normal.  ?    Mouth/Throat:  ?   Mouth: Mucous membranes are moist.  ?   Pharynx: No posterior oropharyngeal erythema.  ?Eyes:  ?   Extraocular Movements: Extraocular movements intact.  ?   Conjunctiva/sclera: Conjunctivae normal.  ?Cardiovascular:  ?   Rate and Rhythm: Normal rate and regular rhythm.  ?   Pulses: Normal pulses.  ?   Heart sounds: Normal heart sounds.  ?Pulmonary:  ?   Effort: Pulmonary effort is normal. No respiratory distress.  ?   Breath sounds: Normal breath sounds.  ?Lymphadenopathy:  ?   Cervical: Cervical adenopathy present.  ?   Left cervical: Superficial cervical adenopathy present.  ?Skin: ?   General: Skin is warm and dry.  ?Neurological:  ?   General: No focal deficit present.  ?   Mental Status: She is alert and oriented to person, place, and time. Mental status is at baseline.  ?Psychiatric:     ?   Mood and Affect: Mood normal.     ?   Behavior: Behavior normal.     ?   Thought Content: Thought content normal.     ?   Judgment: Judgment normal.  ? ? ? ?UC Treatments / Results  ?Labs ?(all labs ordered are listed, but only abnormal results are displayed) ?Labs Reviewed  ?CULTURE, GROUP A STREP [facility]  ?SARS CORONAVIRUS 2 (TAT 6-24 HRS)  ?POCT RAPID STREP A, ED / UC  ? ? ?EKG ? ? ?Radiology ?No results found. ? ?Procedures ?Procedures (including critical care time) ? ?Medications Ordered in UC ?Medications - No data to display ? ?Initial Impression / Assessment and Plan / UC Course  ?I have reviewed the triage vital signs and the nursing notes. ? ?Pertinent labs & imaging results that were available during my care of the patient were reviewed by me and considered in my medical decision making (see chart for details). ? ?  ? ?Physical exam is consistent with cervical lymphadenopathy.  Suspect viral cause of patient's symptoms.  Rapid strep was negative.  Throat culture and COVID-19 viral swab are pending.  No obvious abnormalities to infection on exam.  Patient to  use warm compresses to area.  Advised  patient to follow-up with PCP for further evaluation and management.  Discussed return precautions.  Patient verbalized understanding and was agreeable with plan. ?Final Clinical Impressions(s) / UC Diagnoses  ? ?Final diagnoses:  ?Cervical lymphadenopathy  ? ? ? ?Discharge Instructions   ? ?  ?It appears that you have cervical lymph node swelling.  This is typically related to a viral infection.  Please use warm compresses to affected area.  Rapid strep was negative.  Throat culture and COVID test are pending.  We will call if they are positive.  Please follow-up with primary care doctor. ? ? ? ? ?ED Prescriptions   ?None ?  ? ?PDMP not reviewed this encounter. ?  ?Gustavus Bryant, [place] ?10/26/21 2031 ? ?

## 2021-10-27 LAB — SARS CORONAVIRUS 2 (TAT 6-24 HRS): SARS Coronavirus 2: NEGATIVE

## 2021-10-29 LAB — CULTURE, GROUP A STREP (THRC)

## 2021-11-05 ENCOUNTER — Encounter: Payer: Self-pay | Admitting: Internal Medicine

## 2021-11-05 ENCOUNTER — Ambulatory Visit (INDEPENDENT_AMBULATORY_CARE_PROVIDER_SITE_OTHER): Payer: Medicare HMO | Admitting: Internal Medicine

## 2021-11-05 VITALS — BP 130/70 | HR 53 | Temp 97.6°F | Wt 123.0 lb

## 2021-11-05 DIAGNOSIS — L84 Corns and callosities: Secondary | ICD-10-CM

## 2021-11-05 DIAGNOSIS — R590 Localized enlarged lymph nodes: Secondary | ICD-10-CM | POA: Diagnosis not present

## 2021-11-05 NOTE — Progress Notes (Signed)
? ? ? ?Acute office Visit ? ? ? ? ?This visit occurred during the SARS-CoV-2 public health emergency.  Safety protocols were in place, including screening questions prior to the visit, additional usage of staff PPE, and extensive cleaning of exam room while observing appropriate contact time as indicated for disinfecting solutions.  ? ? ?CC/Reason for Visit: Discuss acute concerns ? ?HPI: Julie Duffy is a 76 y.o. female who is coming in today for the above mentioned reasons.  She is here today to discuss 2 issues: ? ?1.  She has noted a small lump on the left side of her neck that has been present now for around 10 days.  Important to note that in February she had an upper respiratory infection with laryngitis. ? ?2.  She has noticed some painful spots on the balls of her foot bilaterally.  She has some callus formation in that area.  She has been to a pedicure salon who shaved down the callus without any significant relief. ? ?Past Medical/Surgical History: ?Past Medical History:  ?Diagnosis Date  ? Allergy   ? seasonal  ? Anxiety   ? Constipation   ? COVID-19 virus infection 09/2019  ? H. pylori infection   ? Hypertension   ? Hypothyroid   ? Scoliosis   ? ? ?Past Surgical History:  ?Procedure Laterality Date  ? APPENDECTOMY    ? NASAL SINUS SURGERY    ? NECK LESION BIOPSY    ? cysts benign  ? TUBAL LIGATION    ? UMBILICAL HERNIA REPAIR    ? ? ?Social History: ? reports that she has never smoked. She has never used smokeless tobacco. She reports that she does not drink alcohol and does not use drugs. ? ?Allergies: ?Allergies  ?Allergen Reactions  ? Guaifenesin & Derivatives Nausea And Vomiting  ? Clindamycin Other (See Comments)  ?  GI intolerance  ? Clindamycin/Lincomycin [employer] Other (See Comments)  ?  GI intolerance  ? Codeine Other (See Comments)  ?  hallucinations  ? Sulfa Antibiotics Other (See Comments)  ?  unspecified  ? ? ?Family History:  ?Family History  ?Problem Relation Age of Onset  ?  Cancer Mother 45  ?     colon ca  ? Heart disease Mother   ? Colon cancer Mother   ? Thyroid disease Mother   ? Cancer Father   ? Thyroid disease Daughter   ? Esophageal cancer Neg Hx   ? Stomach cancer Neg Hx   ? ? ? ?Current Outpatient Medications:  ?  ALPRAZolam (XANAX) 0.5 MG tablet, Take 1 tablet (0.5 mg total) by mouth 2 (two) times daily., Disp: 90 tablet, Rfl: 1 ?  levothyroxine (SYNTHROID) 88 MCG tablet, Take 1 tablet (88 mcg total) by mouth daily., Disp: 90 tablet, Rfl: 1 ?  atorvastatin (LIPITOR) 20 MG tablet, Take 1 tablet (20 mg total) by mouth daily. (Patient not taking: Reported on 11/05/2021), Disp: 90 tablet, Rfl: 1 ? ?Review of Systems:  ?Constitutional: Denies fever, chills, diaphoresis, appetite change and fatigue.  ?HEENT: Denies photophobia, eye pain, redness, hearing loss, ear pain, congestion, sore throat, rhinorrhea, sneezing, mouth sores, trouble swallowing, neck pain, neck stiffness and tinnitus.   ?Respiratory: Denies SOB, DOE, cough, chest tightness,  and wheezing.   ?Cardiovascular: Denies chest pain, palpitations and leg swelling.  ?Gastrointestinal: Denies nausea, vomiting, abdominal pain, diarrhea, constipation, blood in stool and abdominal distention.  ?Genitourinary: Denies dysuria, urgency, frequency, hematuria, flank pain and difficulty urinating.  ?Endocrine:  Denies: hot or cold intolerance, sweats, changes in hair or nails, polyuria, polydipsia. ?Musculoskeletal: Denies myalgias, back pain, joint swelling, arthralgias and gait problem.  ?Skin: Denies pallor, rash and wound.  ?Neurological: Denies dizziness, seizures, syncope, weakness, light-headedness, numbness and headaches.  ?Hematological: Denies adenopathy. Easy bruising, personal or family bleeding history  ?Psychiatric/Behavioral: Denies suicidal ideation, mood changes, confusion, nervousness, sleep disturbance and agitation ? ? ? ?Physical Exam: ?Vitals:  ? 11/05/21 0739  ?BP: 140/70  ?Pulse: (!) 53  ?Temp: 97.6 ?F (36.4  ?C)  ?TempSrc: Oral  ?SpO2: 94%  ?Weight: 123 lb (55.8 kg)  ? ? ?Body mass index is 20.47 kg/m?. ? ? ?Constitutional: NAD, calm, comfortable ?Eyes: PERRL, lids and conjunctivae normal ?ENMT: Mucous membranes are moist. Posterior pharynx is erythematous but clear of any exudate or lesions.  She has full dentures.  Tympanic membrane is pearly white, no erythema or bulging. ?Neck: She has bilateral small anterior cervical lymphadenopathy, 1 on each side. ?Respiratory: clear to auscultation bilaterally, no wheezing, no crackles. Normal respiratory effort. No accessory muscle use.  ?Cardiovascular: Regular rate and rhythm, no murmurs / rubs / gallops. No extremity edema.  ?Neurologic: Grossly intact and nonfocal ?Psychiatric: Normal judgment and insight. Alert and oriented x 3. Normal mood.  ? ? ?Impression and Plan: ? ?LAD (lymphadenopathy), cervical ?-Given the context of her recent significant URI, suspect these are reactive lymph nodes. ?-Have advised observation over the next 6 weeks, if no improvement consider imaging versus excision. ? ?Corn of foot ? - Plan: Ambulatory referral to Podiatry ?-Lesions appear to be corns bilaterally, will refer to podiatry for potential excision. ? ? ? ?Time spent:22 minutes reviewing chart, interviewing and examining patient and formulating plan of care. ? ? ? ? ?Chaya Jan, MD ?[facility] ? ? ?

## 2021-11-07 ENCOUNTER — Other Ambulatory Visit: Payer: Self-pay | Admitting: Internal Medicine

## 2021-11-07 DIAGNOSIS — R5383 Other fatigue: Secondary | ICD-10-CM

## 2021-11-07 NOTE — Telephone Encounter (Signed)
Last Ov 11/05/21 ? ? Filled 04/24/21 ?Is it ok to refill? ?

## 2021-11-21 ENCOUNTER — Ambulatory Visit: Payer: Medicare HMO | Admitting: Podiatry

## 2021-11-22 ENCOUNTER — Encounter: Payer: Self-pay | Admitting: Podiatry

## 2021-11-22 ENCOUNTER — Ambulatory Visit: Payer: Medicare HMO | Admitting: Podiatry

## 2021-11-22 DIAGNOSIS — M778 Other enthesopathies, not elsewhere classified: Secondary | ICD-10-CM

## 2021-11-22 DIAGNOSIS — M2011 Hallux valgus (acquired), right foot: Secondary | ICD-10-CM

## 2021-11-22 DIAGNOSIS — M2012 Hallux valgus (acquired), left foot: Secondary | ICD-10-CM

## 2021-11-22 DIAGNOSIS — G5790 Unspecified mononeuropathy of unspecified lower limb: Secondary | ICD-10-CM | POA: Diagnosis not present

## 2021-11-22 NOTE — Progress Notes (Signed)
?Subjective:  ?Patient ID: Julie Duffy, female    DOB: January 20, 1946,  MRN: [id number] ?HPI ?Chief Complaint  ?Patient presents with  ? Foot Pain  ?  Plantar forefoot bilateral - multiple, small callused areas x 1 year, thought had neuropathy, but PCP checked said pain was just coming from the calluses, tender walking  ? New Patient (Initial Visit)  ? ? ?76 y.o. female presents with the above complaint.  ? ?ROS: Denies fever chills nausea vomit muscle aches pains calf pain back pain chest pain shortness of breath. ? ?Past Medical History:  ?Diagnosis Date  ? Allergy   ? seasonal  ? Anxiety   ? Constipation   ? COVID-19 virus infection 09/2019  ? H. pylori infection   ? Hypertension   ? Hypothyroid   ? Scoliosis   ? ?Past Surgical History:  ?Procedure Laterality Date  ? APPENDECTOMY    ? NASAL SINUS SURGERY    ? NECK LESION BIOPSY    ? cysts benign  ? TUBAL LIGATION    ? UMBILICAL HERNIA REPAIR    ? ? ?Current Outpatient Medications:  ?  ALPRAZolam (XANAX) 0.5 MG tablet, TAKE 1 TABLET TWICE DAILY, Disp: 60 tablet, Rfl: 0 ?  atorvastatin (LIPITOR) 20 MG tablet, Take 1 tablet (20 mg total) by mouth daily. (Patient not taking: Reported on 11/05/2021), Disp: 90 tablet, Rfl: 1 ?  levothyroxine (SYNTHROID) 88 MCG tablet, Take 1 tablet (88 mcg total) by mouth daily., Disp: 90 tablet, Rfl: 1 ? ?Allergies  ?Allergen Reactions  ? Guaifenesin & Derivatives Nausea And Vomiting  ? Clindamycin Other (See Comments)  ?  GI intolerance  ? Clindamycin/Lincomycin [employer] Other (See Comments)  ?  GI intolerance  ? Codeine Other (See Comments)  ?  hallucinations  ? Sulfa Antibiotics Other (See Comments)  ?  unspecified  ? ?Review of Systems ?Objective:  ?There were no vitals filed for this visit. ? ?General: Well developed, nourished, in no acute distress, alert and oriented x3  ? ?Dermatological: Skin is warm, dry and supple bilateral. Nails x 10 are well maintained; remaining integument appears unremarkable at this time. There  are no open sores, no preulcerative lesions, no rash or signs of infection present.  She has a very small very thin layer of callus beneath the second metatarsal more than likely secondary to the hallux valgus deformity.  There is not enough to debride here.  But this would result in capsulitis and soft tissue swelling around the second metatarsal phalangeal joint plantarly and some neuritis as well. ? ?Vascular: Dorsalis Pedis artery and Posterior Tibial artery pedal pulses are 2/4 bilateral with immedate capillary fill time. Pedal hair growth present. No varicosities and no lower extremity edema present bilateral.  ? ?Neruologic: Grossly intact via light touch bilateral. Vibratory intact via tuning fork bilateral. Protective threshold with Semmes Wienstein monofilament intact to all pedal sites bilateral. Patellar and Achilles deep tendon reflexes 2+ bilateral. No Babinski or clonus noted bilateral.  ? ?Musculoskeletal: No gross boney pedal deformities bilateral. No pain, crepitus, or limitation noted with foot and ankle range of motion bilateral. Muscular strength 5/5 in all groups tested bilateral.  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  Prominent second metatarsal phalangeal joint toes are rectus at this point. ? ?Gait: Unassisted, Nonantalgic.  ? ? ?Radiographs: ? ?None taken ? ?Assessment & Plan:  ? 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 ? ?Plan: Discussed appropriate shoe gear with her today there  is really nothing to trim as far as callus goes. ? ? ? ? ?Jader Desai T. [place], DPM ?

## 2021-12-06 ENCOUNTER — Ambulatory Visit: Payer: Medicare HMO | Admitting: Plastic Surgery

## 2021-12-06 VITALS — BP 157/87 | HR 62 | Ht 64.0 in | Wt 116.4 lb

## 2021-12-06 DIAGNOSIS — R238 Other skin changes: Secondary | ICD-10-CM

## 2021-12-06 DIAGNOSIS — Z411 Encounter for cosmetic surgery: Secondary | ICD-10-CM

## 2021-12-06 DIAGNOSIS — L910 Hypertrophic scar: Secondary | ICD-10-CM

## 2021-12-06 NOTE — Progress Notes (Signed)
? ?Referring Provider ?Philip Aspen, Limmie Patricia, MD ?[phone] ?[place],  [place] [address]  ? ?CC:  ?Chief Complaint  ?Patient presents with  ? Consult  ?   ? ?[name] is an 76 y.o. female.  ?HPI: Patient presents to discuss aesthetic concerns primarily regarding her neck.  She is bothered by the skin laxity and excess skin in that area in particular.  She has had a left neck and central inferior neck cyst removed in the past it sounds like this was done by an ENT physician.  She seems particularly interested in nonsurgical cosmetic interventions for her face. ? ?Allergies  ?Allergen Reactions  ? Guaifenesin & Derivatives Nausea And Vomiting  ? Clindamycin Other (See Comments)  ?  GI intolerance  ? Clindamycin/Lincomycin [employer] Other (See Comments)  ?  GI intolerance  ? Codeine Other (See Comments)  ?  hallucinations  ? Sulfa Antibiotics Other (See Comments)  ?  unspecified  ? ? ?Outpatient Encounter Medications as of 12/06/2021  ?Medication Sig  ? ALPRAZolam (XANAX) 0.5 MG tablet TAKE 1 TABLET TWICE DAILY  ? levothyroxine (SYNTHROID) 88 MCG tablet Take 1 tablet (88 mcg total) by mouth daily.  ? atorvastatin (LIPITOR) 20 MG tablet Take 1 tablet (20 mg total) by mouth daily. (Patient not taking: Reported on 12/06/2021)  ? ?No facility-administered encounter medications on file as of 12/06/2021.  ?  ? ?Past Medical History:  ?Diagnosis Date  ? Allergy   ? seasonal  ? Anxiety   ? Constipation   ? COVID-19 virus infection 09/2019  ? H. pylori infection   ? Hypertension   ? Hypothyroid   ? Scoliosis   ? ? ?Past Surgical History:  ?Procedure Laterality Date  ? APPENDECTOMY    ? NASAL SINUS SURGERY    ? NECK LESION BIOPSY    ? cysts benign  ? TUBAL LIGATION    ? UMBILICAL HERNIA REPAIR    ? ? ?Family History  ?Problem Relation Age of Onset  ? Cancer Mother 9  ?     colon ca  ? Heart disease Mother   ? Colon cancer Mother   ? Thyroid disease Mother   ? Cancer Father   ? Thyroid disease Daughter   ?  Esophageal cancer Neg Hx   ? Stomach cancer Neg Hx   ? ? ?Social History  ? ?Social History Narrative  ? Not on file  ?  ? ?Review of Systems ?General: Denies fevers, chills, weight loss ?CV: Denies chest pain, shortness of breath, palpitations ? ?Physical Exam ? ?  12/06/2021  ? 10:33 AM 11/05/2021  ?  7:39 AM 10/26/2021  ?  7:19 PM  ?Vitals with BMI  ?Height 5\' 4"     ?Weight 116 lbs 6 oz 123 lbs   ?BMI 19.97    ?Systolic 157 130  ?Diastolic 87 70 81  ?Pulse 62 53 63  ?  ?General:  No acute distress,  Alert and oriented, Non-Toxic, Normal speech and affect ?Examination shows moderate to severe neck skin laxity and soft tissue descent.  Moderate jowl deformity.  Moderate nasolabial fold prominence.  Left neck and inferior central neck transverse scars from previous cyst excisions.  No obvious cystic lesions at this point although she does have prominent submandibular glands.  Cranial nerves grossly intact.  Pupils equal round reactive to light and extraocular movements intact. ? ?Assessment/Plan ?Patient presents with age-related changes in the neck and lower face in particular.  We discussed  laser resurfacing as a nonsurgical option and face and neck lift as a surgical option.  We discussed the pros and cons of each choice.  We discussed the anticipated downtime and approximate cost.  We discussed the location and orientation of the scars for the surgical procedures.  Ultimately she is leaning towards laser resurfacing at this point but wants to think about this.  I showed her before and after pictures in our pamphlets.  She will plan to call back to discuss halo resurfacing with [facility] when she is prepared to move forward with that.  All of her other questions were answered. ? ?[name] ?12/06/2021, 3:22 PM  ? ? ?  ?

## 2021-12-27 ENCOUNTER — Encounter: Payer: Self-pay | Admitting: Internal Medicine

## 2021-12-27 ENCOUNTER — Other Ambulatory Visit: Payer: Self-pay | Admitting: Internal Medicine

## 2021-12-27 ENCOUNTER — Other Ambulatory Visit: Payer: Self-pay | Admitting: *Deleted

## 2021-12-27 ENCOUNTER — Ambulatory Visit (INDEPENDENT_AMBULATORY_CARE_PROVIDER_SITE_OTHER): Payer: Medicare HMO | Admitting: Internal Medicine

## 2021-12-27 VITALS — BP 130/70 | HR 51 | Temp 97.5°F | Ht 64.0 in | Wt 113.5 lb

## 2021-12-27 DIAGNOSIS — R5383 Other fatigue: Secondary | ICD-10-CM

## 2021-12-27 DIAGNOSIS — R634 Abnormal weight loss: Secondary | ICD-10-CM

## 2021-12-27 DIAGNOSIS — E871 Hypo-osmolality and hyponatremia: Secondary | ICD-10-CM

## 2021-12-27 DIAGNOSIS — E039 Hypothyroidism, unspecified: Secondary | ICD-10-CM

## 2021-12-27 LAB — CBC WITH DIFFERENTIAL/PLATELET
Basophils Absolute: 0.1 10*3/uL (ref 0.0–0.1)
Basophils Relative: 0.7 % (ref 0.0–3.0)
Eosinophils Absolute: 0.3 10*3/uL (ref 0.0–0.7)
Eosinophils Relative: 3.9 % (ref 0.0–5.0)
HCT: 41 % (ref 36.0–46.0)
Hemoglobin: 13.7 g/dL (ref 12.0–15.0)
Lymphocytes Relative: 17.8 % (ref 12.0–46.0)
Lymphs Abs: 1.3 10*3/uL (ref 0.7–4.0)
MCHC: 33.4 g/dL (ref 30.0–36.0)
MCV: 87.8 fl (ref 78.0–100.0)
Monocytes Absolute: 0.4 10*3/uL (ref 0.1–1.0)
Monocytes Relative: 6.3 % (ref 3.0–12.0)
Neutro Abs: 5.1 10*3/uL (ref 1.4–7.7)
Neutrophils Relative %: 71.3 % (ref 43.0–77.0)
Platelets: 264 10*3/uL (ref 150.0–400.0)
RBC: 4.67 Mil/uL (ref 3.87–5.11)
RDW: 13.3 % (ref 11.5–15.5)
WBC: 7.1 10*3/uL (ref 4.0–10.5)

## 2021-12-27 LAB — COMPREHENSIVE METABOLIC PANEL
ALT: 18 U/L (ref 0–35)
AST: 19 U/L (ref 0–37)
Albumin: 4.4 g/dL (ref 3.5–5.2)
Alkaline Phosphatase: 105 U/L (ref 39–117)
BUN: 22 mg/dL (ref 6–23)
CO2: 29 mEq/L (ref 19–32)
Calcium: 10.1 mg/dL (ref 8.4–10.5)
Chloride: 92 mEq/L — ABNORMAL LOW (ref 96–112)
Creatinine, Ser: 1.09 mg/dL (ref 0.40–1.20)
GFR: 49.23 mL/min — ABNORMAL LOW (ref >60.00)
Glucose, Bld: 411 mg/dL — ABNORMAL HIGH (ref 70–99)
Potassium: 4.8 mEq/L (ref 3.5–5.1)
Sodium: 129 mEq/L — ABNORMAL LOW (ref 135–145)
Total Bilirubin: 1.1 mg/dL (ref 0.2–1.2)
Total Protein: 7.6 g/dL (ref 6.0–8.3)

## 2021-12-27 LAB — TSH: TSH: 20.54 u[IU]/mL — ABNORMAL HIGH (ref 0.35–5.50)

## 2021-12-27 LAB — VITAMIN B12: Vitamin B-12: 540 pg/mL (ref 211–911)

## 2021-12-27 MED ORDER — LEVOTHYROXINE SODIUM 100 MCG PO TABS: 100.0000 ug | ORAL_TABLET | Freq: Every day | ORAL | 3 refills | Status: DC

## 2021-12-27 NOTE — Progress Notes (Signed)
Established Patient Office Visit     CC/Reason for Visit: Fatigue, weight loss  HPI: Julie Duffy is a 76 y.o. female who is coming in today for the above mentioned reasons.  She is surprised that she only weighs 113 pounds today.  She weighed 123 pounds when I last saw her in April.  She admits to not having a good appetite.  She does not describe dysphagia.  Reviewing her lab work from September she was noted to have an over suppressed TSH and was asked to decrease her levothyroxine to 88 mcg but she did not return for follow-up labs.  She was also found to have a low normal B12 level of 247.  She has not had any recent URIs or GI illnesses.  Past Medical/Surgical History: Past Medical History:  Diagnosis Date   Allergy    seasonal   Anxiety    Constipation    COVID-19 virus infection 09/2019   H. pylori infection    Hypertension    Hypothyroid    Scoliosis     Past Surgical History:  Procedure Laterality Date   APPENDECTOMY     NASAL SINUS SURGERY     NECK LESION BIOPSY     cysts benign   TUBAL LIGATION     UMBILICAL HERNIA REPAIR      Social History:  reports that she has never smoked. She has never used smokeless tobacco. She reports that she does not drink alcohol and does not use drugs.  Allergies: Allergies  Allergen Reactions   Guaifenesin & Derivatives Nausea And Vomiting   Clindamycin Other (See Comments)    GI intolerance   Clindamycin/Lincomycin [employer] Other (See Comments)    GI intolerance   Codeine Other (See Comments)    hallucinations   Sulfa Antibiotics Other (See Comments)    unspecified    Family History:  Family History  Problem Relation Age of Onset   Cancer Mother 59       colon ca   Heart disease Mother    Colon cancer Mother    Thyroid disease Mother    Cancer Father    Thyroid disease Daughter    Esophageal cancer Neg Hx    Stomach cancer Neg Hx      Current Outpatient Medications:    ALPRAZolam (XANAX) 0.5  MG tablet, TAKE 1 TABLET TWICE DAILY, Disp: 60 tablet, Rfl: 0   levothyroxine (SYNTHROID) 88 MCG tablet, Take 1 tablet (88 mcg total) by mouth daily., Disp: 90 tablet, Rfl: 1   atorvastatin (LIPITOR) 20 MG tablet, Take 1 tablet (20 mg total) by mouth daily. (Patient not taking: Reported on 12/06/2021), Disp: 90 tablet, Rfl: 1  Review of Systems:  Constitutional: Denies fever, chills, diaphoresis, appetite change. HEENT: Denies photophobia, eye pain, redness, hearing loss, ear pain, congestion, sore throat, rhinorrhea, sneezing, mouth sores, trouble swallowing, neck pain, neck stiffness and tinnitus.   Respiratory: Denies SOB, DOE, cough, chest tightness,  and wheezing.   Cardiovascular: Denies chest pain, palpitations and leg swelling.  Gastrointestinal: Denies nausea, vomiting, abdominal pain, diarrhea, constipation, blood in stool and abdominal distention.  Genitourinary: Denies dysuria, urgency, frequency, hematuria, flank pain and difficulty urinating.  Endocrine: Denies: hot or cold intolerance, sweats, changes in hair or nails, polyuria, polydipsia. Musculoskeletal: Denies myalgias, back pain, joint swelling, arthralgias and gait problem.  Skin: Denies pallor, rash and wound.  Neurological: Denies dizziness, seizures, syncope, weakness, light-headedness, numbness and headaches.  Hematological: Denies adenopathy. Easy bruising, personal  or family bleeding history  Psychiatric/Behavioral: Denies suicidal ideation, mood changes, confusion, nervousness, sleep disturbance and agitation    Physical Exam: Vitals:   12/27/21 0935  BP: 130/70  Pulse: (!) 51  Temp: (!) 97.5 F (36.4 C)  TempSrc: Oral  SpO2: 94%  Weight: 113 lb 8 oz (51.5 kg)  Height: 5\' 4"  (1.626 m)    Body mass index is 19.48 kg/m.   Constitutional: NAD, calm, comfortable Eyes: PERRL, lids and conjunctivae normal ENMT: Mucous membranes are moist. Posterior pharynx clear of any exudate or lesions. Normal dentition.  Tympanic membrane is pearly white, no erythema or bulging. Neck: normal, supple, no masses, no thyromegaly Respiratory: clear to auscultation bilaterally, no wheezing, no crackles. Normal respiratory effort. No accessory muscle use.  Cardiovascular: Regular rate and rhythm, no murmurs / rubs / gallops. No extremity edema.  Neurologic: CN 2-12 grossly intact. Sensation intact, DTR normal. Strength 5/5 in all 4.  Psychiatric: Normal judgment and insight. Alert and oriented x 3. Normal mood.    Impression and Plan:  Fatigue, unspecified type - Plan: CBC with Differential/Platelet, Comprehensive metabolic panel, TSH, Vitamin B12  Weight loss - Plan: Amb ref to Medical Nutrition Therapy-MNT  -Check labs today, especially concerned about TSH and vitamin B12.  We have discussed protein rich diet.  I will refer her to medical nutrition for further instruction.  Time spent:31 minutes reviewing chart, interviewing and examining patient and formulating plan of care.   Patient Instructions  -Nice seeing you today!!  -Lab work today; will notify you once results are available.  -Dietitian referral has been placed.    Lelon Frohlich, MD [facility] at [facility]

## 2021-12-27 NOTE — Patient Instructions (Signed)
-  Nice seeing you today!!  -Lab work today; will notify you once results are available.  -Dietitian referral has been placed.

## 2022-01-10 ENCOUNTER — Other Ambulatory Visit (INDEPENDENT_AMBULATORY_CARE_PROVIDER_SITE_OTHER): Payer: Medicare HMO

## 2022-01-10 DIAGNOSIS — E871 Hypo-osmolality and hyponatremia: Secondary | ICD-10-CM

## 2022-01-10 DIAGNOSIS — E039 Hypothyroidism, unspecified: Secondary | ICD-10-CM

## 2022-01-10 LAB — BASIC METABOLIC PANEL
BUN: 26 mg/dL — ABNORMAL HIGH (ref 6–23)
CO2: 27 mEq/L (ref 19–32)
Calcium: 10.5 mg/dL (ref 8.4–10.5)
Chloride: 91 mEq/L — ABNORMAL LOW (ref 96–112)
Creatinine, Ser: 1.13 mg/dL (ref 0.40–1.20)
GFR: 47.14 mL/min — ABNORMAL LOW (ref >60.00)
Glucose, Bld: 408 mg/dL — ABNORMAL HIGH (ref 70–99)
Potassium: 4.4 mEq/L (ref 3.5–5.1)
Sodium: 131 mEq/L — ABNORMAL LOW (ref 135–145)

## 2022-01-10 LAB — TSH: TSH: 10.36 u[IU]/mL — ABNORMAL HIGH (ref 0.35–5.50)

## 2022-01-14 ENCOUNTER — Other Ambulatory Visit: Payer: Self-pay | Admitting: Internal Medicine

## 2022-01-14 DIAGNOSIS — E039 Hypothyroidism, unspecified: Secondary | ICD-10-CM

## 2022-01-14 MED ORDER — LEVOTHYROXINE SODIUM 125 MCG PO TABS: ORAL_TABLET | ORAL | 1 refills | Status: DC

## 2022-01-14 MED ORDER — LEVOTHYROXINE SODIUM 100 MCG PO TABS: ORAL_TABLET | ORAL | 1 refills | Status: AC

## 2022-01-15 ENCOUNTER — Ambulatory Visit: Payer: Medicare HMO | Admitting: Internal Medicine

## 2022-01-16 ENCOUNTER — Other Ambulatory Visit: Payer: Self-pay

## 2022-01-16 ENCOUNTER — Telehealth: Payer: Self-pay | Admitting: Internal Medicine

## 2022-01-16 ENCOUNTER — Other Ambulatory Visit (HOSPITAL_COMMUNITY)
Admission: RE | Admit: 2022-01-16 | Discharge: 2022-01-16 | Disposition: A | Discharge status: Home / Self Care | Payer: Medicare HMO

## 2022-01-16 ENCOUNTER — Encounter (HOSPITAL_COMMUNITY): Payer: Self-pay | Admitting: Pharmacy Technician

## 2022-01-16 ENCOUNTER — Other Ambulatory Visit (INDEPENDENT_AMBULATORY_CARE_PROVIDER_SITE_OTHER): Payer: Medicare HMO

## 2022-01-16 ENCOUNTER — Emergency Department (HOSPITAL_COMMUNITY)
Admission: EM | Admit: 2022-01-16 | Discharge: 2022-01-17 | Disposition: A | Discharge status: Home / Self Care | Payer: Medicare HMO | Attending: Emergency Medicine | Admitting: Emergency Medicine

## 2022-01-16 ENCOUNTER — Ambulatory Visit (INDEPENDENT_AMBULATORY_CARE_PROVIDER_SITE_OTHER): Payer: Medicare HMO | Admitting: Internal Medicine

## 2022-01-16 VITALS — BP 138/90 | HR 55 | Temp 98.4°F | Wt 109.5 lb

## 2022-01-16 DIAGNOSIS — R739 Hyperglycemia, unspecified: Secondary | ICD-10-CM | POA: Diagnosis present

## 2022-01-16 DIAGNOSIS — R350 Frequency of micturition: Secondary | ICD-10-CM | POA: Diagnosis not present

## 2022-01-16 DIAGNOSIS — I10 Essential (primary) hypertension: Secondary | ICD-10-CM | POA: Insufficient documentation

## 2022-01-16 DIAGNOSIS — Z79899 Other long term (current) drug therapy: Secondary | ICD-10-CM | POA: Insufficient documentation

## 2022-01-16 DIAGNOSIS — E119 Type 2 diabetes mellitus without complications: Secondary | ICD-10-CM | POA: Diagnosis not present

## 2022-01-16 DIAGNOSIS — Z7984 Long term (current) use of oral hypoglycemic drugs: Secondary | ICD-10-CM | POA: Insufficient documentation

## 2022-01-16 DIAGNOSIS — E1165 Type 2 diabetes mellitus with hyperglycemia: Secondary | ICD-10-CM | POA: Diagnosis not present

## 2022-01-16 DIAGNOSIS — R5383 Other fatigue: Secondary | ICD-10-CM | POA: Diagnosis not present

## 2022-01-16 DIAGNOSIS — Z794 Long term (current) use of insulin: Secondary | ICD-10-CM | POA: Diagnosis not present

## 2022-01-16 DIAGNOSIS — E039 Hypothyroidism, unspecified: Secondary | ICD-10-CM | POA: Diagnosis not present

## 2022-01-16 LAB — CBC WITH DIFFERENTIAL/PLATELET
Abs Immature Granulocytes: 0.04 10*3/uL (ref 0.00–0.07)
Basophils Absolute: 0.1 10*3/uL (ref 0.0–0.1)
Basophils Absolute: 0.1 10*3/uL (ref 0.0–0.1)
Basophils Relative: 1.1 % (ref 0.0–3.0)
Basophils Relative: 1 %
Eosinophils Absolute: 0.1 10*3/uL (ref 0.0–0.7)
Eosinophils Absolute: 0.2 10*3/uL (ref 0.0–0.5)
Eosinophils Relative: 2.1 % (ref 0.0–5.0)
Eosinophils Relative: 2 %
HCT: 40.7 % (ref 36.0–46.0)
HCT: 41.5 % (ref 36.0–46.0)
Hemoglobin: 13.8 g/dL (ref 12.0–15.0)
Hemoglobin: 13.9 g/dL (ref 12.0–15.0)
Immature Granulocytes: 1 %
Lymphocytes Relative: 22 % (ref 12.0–46.0)
Lymphocytes Relative: 20 %
Lymphs Abs: 1.5 10*3/uL (ref 0.7–4.0)
Lymphs Abs: 1.6 10*3/uL (ref 0.7–4.0)
MCH: 29.9 pg (ref 26.0–34.0)
MCHC: 33.9 g/dL (ref 30.0–36.0)
MCHC: 33.5 g/dL (ref 30.0–36.0)
MCV: 88 fl (ref 78.0–100.0)
MCV: 89.2 fL (ref 80.0–100.0)
Monocytes Absolute: 0.5 10*3/uL (ref 0.1–1.0)
Monocytes Absolute: 0.6 10*3/uL (ref 0.1–1.0)
Monocytes Relative: 7.3 % (ref 3.0–12.0)
Monocytes Relative: 7 %
Neutro Abs: 4.6 10*3/uL (ref 1.4–7.7)
Neutro Abs: 5.5 10*3/uL (ref 1.7–7.7)
Neutrophils Relative %: 67.5 % (ref 43.0–77.0)
Neutrophils Relative %: 69 %
Platelets: 259 10*3/uL (ref 150.0–400.0)
Platelets: 285 10*3/uL (ref 150–400)
RBC: 4.63 Mil/uL (ref 3.87–5.11)
RBC: 4.65 MIL/uL (ref 3.87–5.11)
RDW: 13.1 % (ref 11.5–15.5)
RDW: 12.6 % (ref 11.5–15.5)
WBC: 6.9 10*3/uL (ref 4.0–10.5)
WBC: 7.9 10*3/uL (ref 4.0–10.5)
nRBC: 0 % (ref 0.0–0.2)

## 2022-01-16 LAB — COMPREHENSIVE METABOLIC PANEL
ALT: 16 U/L (ref 0–35)
ALT: 18 U/L (ref 0–44)
AST: 16 U/L (ref 0–37)
AST: 16 U/L (ref 15–41)
Albumin: 4.2 g/dL (ref 3.5–5.2)
Albumin: 3.8 g/dL (ref 3.5–5.0)
Alkaline Phosphatase: 108 U/L (ref 39–117)
Alkaline Phosphatase: 103 U/L (ref 38–126)
Anion gap: 14 (ref 5–15)
BUN: 18 mg/dL (ref 6–23)
BUN: 20 mg/dL (ref 8–23)
CO2: 27 mEq/L (ref 19–32)
CO2: 23 mmol/L (ref 22–32)
Calcium: 10 mg/dL (ref 8.4–10.5)
Calcium: 9.7 mg/dL (ref 8.9–10.3)
Chloride: 91 mEq/L — ABNORMAL LOW (ref 96–112)
Chloride: 94 mmol/L — ABNORMAL LOW (ref 98–111)
Creatinine, Ser: 0.98 mg/dL (ref 0.40–1.20)
Creatinine, Ser: 0.93 mg/dL (ref 0.44–1.00)
GFR, Estimated: >60 mL/min (ref >60)
GFR: 55.91 mL/min — ABNORMAL LOW (ref >60.00)
Glucose, Bld: 543 mg/dL (ref 70–99)
Glucose, Bld: 430 mg/dL — ABNORMAL HIGH (ref 70–99)
Potassium: 4.8 mEq/L (ref 3.5–5.1)
Potassium: 4.7 mmol/L (ref 3.5–5.1)
Sodium: 130 mEq/L — ABNORMAL LOW (ref 135–145)
Sodium: 131 mmol/L — ABNORMAL LOW (ref 135–145)
Total Bilirubin: 0.9 mg/dL (ref 0.2–1.2)
Total Bilirubin: 1.3 mg/dL — ABNORMAL HIGH (ref 0.3–1.2)
Total Protein: 7.4 g/dL (ref 6.0–8.3)
Total Protein: 6.9 g/dL (ref 6.5–8.1)

## 2022-01-16 LAB — LIPID PANEL
Cholesterol: 315 mg/dL — ABNORMAL HIGH (ref 0–200)
HDL: 71.1 mg/dL (ref >39.00)
NonHDL: 243.59
Total CHOL/HDL Ratio: 4
Triglycerides: 204 mg/dL — ABNORMAL HIGH (ref 0.0–149.0)
VLDL: 40.8 mg/dL — ABNORMAL HIGH (ref 0.0–40.0)

## 2022-01-16 LAB — POCT URINALYSIS DIPSTICK
Bilirubin, UA: NEGATIVE
Glucose, UA: POSITIVE — AB
Leukocytes, UA: NEGATIVE
Nitrite, UA: NEGATIVE
Protein, UA: NEGATIVE
Spec Grav, UA: 1.015 (ref 1.010–1.025)
Urobilinogen, UA: NEGATIVE E.U./dL — AB
pH, UA: 6 (ref 5.0–8.0)

## 2022-01-16 LAB — URINALYSIS, ROUTINE W REFLEX MICROSCOPIC
Bilirubin Urine: NEGATIVE
Glucose, UA: >=500 mg/dL — AB
Hgb urine dipstick: NEGATIVE
Ketones, ur: 80 mg/dL — AB
Nitrite: NEGATIVE
Protein, ur: NEGATIVE mg/dL
Specific Gravity, Urine: 1.033 — ABNORMAL HIGH (ref 1.005–1.030)
pH: 5 (ref 5.0–8.0)

## 2022-01-16 LAB — POCT GLYCOSYLATED HEMOGLOBIN (HGB A1C): Hemoglobin A1C: 15 % — AB (ref 4.0–5.6)

## 2022-01-16 LAB — LDL CHOLESTEROL, DIRECT: Direct LDL: 206 mg/dL

## 2022-01-16 MED ORDER — FREESTYLE LIBRE 2 SENSOR MISC: 6 refills | Status: DC

## 2022-01-16 MED ORDER — TOUJEO SOLOSTAR 300 UNIT/ML ~~LOC~~ SOPN: 10.0000 [IU] | PEN_INJECTOR | Freq: Every day | SUBCUTANEOUS | 0 refills | Status: DC

## 2022-01-16 NOTE — ED Provider Triage Note (Signed)
Emergency Medicine Provider Triage Evaluation Note  Julie Duffy , a 76 y.o. female  was evaluated in triage.  Sent in by PCP office for evaluation of elevated blood sugar.  Endorses fatigue, mild abdominal discomfort and urinary frequency otherwise denies complaints.  She is new onset diabetic.  PCP started patient on insulin as of today.  Review of Systems  Positive: As above Negative: As above  Physical Exam  BP 138/78 (BP Location: Right Arm)   Pulse 61   Temp 98.6 F (37 C) (Oral)   Resp 14   SpO2 98%  Gen:   Awake, no distress   Resp:  Normal effort  MSK:   Moves extremities without difficulty  Other:    Medical Decision Making  Medically screening exam initiated at 4:11 PM.  Appropriate orders placed.  DYANARA COZZA was informed that the remainder of the evaluation will be completed by another provider, this initial triage assessment does not replace that evaluation, and the importance of remaining in the ED until their evaluation is complete.     Marita Kansas, PA-C 01/16/22 1614

## 2022-01-16 NOTE — ED Triage Notes (Signed)
Pt here with reports of new dx of DM. States blood sugar was 500 and told to come here for insulin and hydration.

## 2022-01-16 NOTE — Progress Notes (Signed)
I have personally called and discussed lab results with patient.  She was just diagnosed with diabetes today.  She has borderline DKA with an anion gap of 12, however with a blood glucose of over 500 I believe she would benefit from at a minimum some insulin and IV fluids.  I have advised ED evaluation today.  I have also noted that she has an LDL of over 200.  She will need to be started on high intensity statin, however I have not discussed this with her today given acuity of her situation.  She already has a follow-up with me in 2 weeks.

## 2022-01-16 NOTE — Progress Notes (Signed)
Established Patient Office Visit     CC/Reason for Visit: "I think I have a UTI"  HPI: Julie Duffy is a 76 y.o. female who is coming in today for the above mentioned reasons. Past Medical History is significant for: Hypertension, hypothyroidism as well as vitamin D and B12 deficiencies.  We have seen her recently for weight loss and fatigue.  Her levothyroxine dose is being adjusted.  She believes she has a UTI due to increased frequency, she does not describe dysuria.  Urine dipstick is negative for leukocytes or nitrates, however she does have 3+ glucose.  On further questioning she has been having a dry mouth, increased thirst and extreme fatigue.  She has now lost an additional 4 pounds since her last visit on May 25.  She also had a blood glucose of 400 on labs drawn 2 weeks ago.  She is a new diabetic.   Past Medical/Surgical History: Past Medical History:  Diagnosis Date   Allergy    seasonal   Anxiety    Constipation    COVID-19 virus infection 09/2019   H. pylori infection    Hypertension    Hypothyroid    Scoliosis     Past Surgical History:  Procedure Laterality Date   APPENDECTOMY     NASAL SINUS SURGERY     NECK LESION BIOPSY     cysts benign   TUBAL LIGATION     UMBILICAL HERNIA REPAIR      Social History:  reports that she has never smoked. She has never used smokeless tobacco. She reports that she does not drink alcohol and does not use drugs.  Allergies: Allergies  Allergen Reactions   Guaifenesin & Derivatives Nausea And Vomiting   Clindamycin Other (See Comments)    GI intolerance   Clindamycin/Lincomycin Cross Reactors Other (See Comments)    GI intolerance   Codeine Other (See Comments)    hallucinations   Sulfa Antibiotics Other (See Comments)    unspecified    Family History:  Family History  Problem Relation Age of Onset   Cancer Mother 27       colon ca   Heart disease Mother    Colon cancer Mother    Thyroid disease  Mother    Cancer Father    Thyroid disease Daughter    Esophageal cancer Neg Hx    Stomach cancer Neg Hx      Current Outpatient Medications:    ALPRAZolam (XANAX) 0.5 MG tablet, TAKE 1 TABLET TWICE DAILY, Disp: 60 tablet, Rfl: 0   Continuous Blood Gluc Sensor (FREESTYLE LIBRE 2 SENSOR) MISC, Apply one sensor every 14 days. Use to check blood sugar., Disp: 1 each, Rfl: 6   insulin glargine, 1 Unit Dial, (TOUJEO SOLOSTAR) 300 UNIT/ML Solostar Pen, Inject 10 Units into the skin at bedtime., Disp: 9 mL, Rfl: 0   levothyroxine (SYNTHROID) 100 MCG tablet, Take 1 tablet Mon-Fri, Disp: 90 tablet, Rfl: 1   levothyroxine (SYNTHROID) 125 MCG tablet, Take 1 tablet Sat and Sun, Disp: 90 tablet, Rfl: 1   atorvastatin (LIPITOR) 20 MG tablet, Take 1 tablet (20 mg total) by mouth daily. (Patient not taking: Reported on 12/06/2021), Disp: 90 tablet, Rfl: 1  Review of Systems:  Constitutional: Denies fever, chills, diaphoresis, appetite change. HEENT: Denies photophobia, eye pain, redness, hearing loss, ear pain, congestion, sore throat, rhinorrhea, sneezing, mouth sores, trouble swallowing, neck pain, neck stiffness and tinnitus.   Respiratory: Denies SOB, DOE, cough, chest tightness,  and wheezing.   Cardiovascular: Denies chest pain, palpitations and leg swelling.  Gastrointestinal: Denies nausea, vomiting, abdominal pain, diarrhea, constipation, blood in stool and abdominal distention.  Genitourinary: Denies dysuria, urgency, frequency, hematuria, flank pain and difficulty urinating.  Endocrine: Denies: hot or cold intolerance, sweats, changes in hair or nails. Musculoskeletal: Denies myalgias, back pain, joint swelling, arthralgias and gait problem.  Skin: Denies pallor, rash and wound.  Neurological: Denies dizziness, seizures, syncope, weakness, light-headedness, numbness and headaches.  Hematological: Denies adenopathy. Easy bruising, personal or family bleeding history  Psychiatric/Behavioral:  Denies suicidal ideation, mood changes, confusion, nervousness, sleep disturbance and agitation    Physical Exam: Vitals:   01/16/22 1138  BP: 138/90  Pulse: (!) 55  Temp: 98.4 F (36.9 C)  TempSrc: Oral  SpO2: 98%  Weight: 109 lb 8 oz (49.7 kg)    Body mass index is 18.8 kg/m.   Constitutional: NAD, calm, comfortable Eyes: PERRL, lids and conjunctivae normal ENMT: Mucous membranes are dry.  Respiratory: clear to auscultation bilaterally, no wheezing, no crackles. Normal respiratory effort. No accessory muscle use.  Cardiovascular: Regular rate and rhythm, no murmurs / rubs / gallops. No extremity edema.  Neurologic: Grossly intact and nonfocal Psychiatric: Normal judgment and insight. Alert and oriented x 3. Normal mood.    Impression and Plan:  Urinary frequency - Plan: POCT urinalysis dipstick, POCT glycosylated hemoglobin (Hb A1C)  Diabetes mellitus, new onset (HCC) - Plan: Continuous Blood Gluc Sensor (FREESTYLE LIBRE 2 SENSOR) MISC, CBC with Differential/Platelet, Comprehensive metabolic panel, Lipid panel, insulin glargine, 1 Unit Dial, (TOUJEO SOLOSTAR) 300 UNIT/ML Solostar Pen, Ambulatory referral to diabetic education, Ambulatory referral to Endocrinology  Fatigue, unspecified type  -She is a new diabetic, her urinary frequency is not due to a UTI but due to osmotic diuresis due to glucosuria. -Stat labs have been drawn, if she has signs of DKA with increased anion gap she will be referred to the emergency department. -Referrals to endocrinology and diabetic education have been placed. -I will start her on glargine insulin 10 units at nighttime, I have given her a sample and a prescription of a freestyle libre and I will have her schedule a 2-week follow-up.  Time spent:37 minutes reviewing chart, interviewing and examining patient and formulating plan of care.     Chaya Jan, MD [facility]

## 2022-01-16 NOTE — Telephone Encounter (Signed)
[facility] is calling and pt is at the lab to get blood drawn and kim did not see an order. I had jaton to verify the pt suppose to go to ED for evaluation her BS was 500

## 2022-01-17 ENCOUNTER — Telehealth: Payer: Self-pay | Admitting: Internal Medicine

## 2022-01-17 ENCOUNTER — Telehealth: Payer: Self-pay | Admitting: *Deleted

## 2022-01-17 LAB — CBG MONITORING, ED: Glucose-Capillary: 307 mg/dL — ABNORMAL HIGH (ref 70–99)

## 2022-01-17 MED ORDER — METFORMIN HCL 500 MG PO TABS: 500.0000 mg | ORAL_TABLET | Freq: Every day | ORAL | 0 refills | Status: DC

## 2022-01-17 MED ORDER — SODIUM CHLORIDE 0.9 % IV BOLUS
1000.0000 mL | Freq: Once | INTRAVENOUS | Status: AC
  Administered 2022-01-17: 1000 mL via INTRAVENOUS

## 2022-01-17 NOTE — Telephone Encounter (Signed)
-----   Message from Estela Y Hernandez Acosta, MD sent at 01/17/2022  8:38 AM EDT ----- Regarding: Quality improvement on patient care Hello, Read your note on this patient you saw in the ED yesterday. When you have a new diabetic that is significantly uncontrolled like she is, metformin (or any oral medications for that matter) is not recommended initially as they are unlikely to bring her to goal and increase risk of DKA. If she is truly type 2, which I suspect she is, she can eventually be transitioned to non-insulin agents once we have achieved better control. I will call her back today to see her in office and restart insulin as I had recommended yesterday. I had sent her to the ED yesterday for some IV hydration (she was dry as a bone) and some insulin to bring her blood glucose down a bit. I will cc Dr. Rees as she was your supervising physician.  Cordell Coke, can you call Julie Duffy back to see her in office?  Thanks, Estela Hernandez  

## 2022-01-17 NOTE — Discharge Instructions (Addendum)
You were seen in the ER today for your elevated blood sugar.  Please DO NOT use the previously prescribed insulin.  You may start the newly prescribed oral medication called metformin.  Follow-up with your primary care doctor for reevaluation of your blood sugar and further education regarding your diabetes; please call within the next week.  They will be able to direct you moving forward if insulin is an appropriate medication, however I would like you to have better understanding of your illness prior to utilization of insulin injections.  Please see the attached information for diabetic diet and return to the ER with any severe symptoms.

## 2022-01-17 NOTE — Telephone Encounter (Signed)
Spoke with patient and she has already taken Metformin 500 mg today.  I advised her to start taking her insuline tomorrow.  Patient states that she does not have needles and no way of checking her glucose.  I offered to show her how on Monday.  Monday "does not work for the patient".  She is going to DM education on Tuesday and will have them show her how to use it.  Advised patient that Dr Ardyth Harps would like to see her in 2 weeks.  If needed patient can be seen sooner.  Patient agreed.

## 2022-01-17 NOTE — Telephone Encounter (Signed)
Left message on machine for patient to return our call 

## 2022-01-17 NOTE — Telephone Encounter (Signed)
-----   Message from Henderson Cloud, MD sent at 01/17/2022  8:38 AM EDT ----- Regarding: Quality improvement on patient care Hello, Read your note on this patient you saw in the ED yesterday. When you have a new diabetic that is significantly uncontrolled like she is, metformin (or any oral medications for that matter) is not recommended initially as they are unlikely to bring her to goal and increase risk of DKA. If she is truly type 2, which I suspect she is, she can eventually be transitioned to non-insulin agents once we have achieved better control. I will call her back today to see her in office and restart insulin as I had recommended yesterday. I had sent her to the ED yesterday for some IV hydration (she was dry as a bone) and some insulin to bring her blood glucose down a bit. I will cc Dr. Madilyn Hook as she was your supervising physician.  Fleet Contras, can you call Nena back to see her in office?  Thanks, Peggye Pitt

## 2022-01-17 NOTE — Telephone Encounter (Signed)
3rd attempt to call the patient.  Left message on machine for patient to return our call.

## 2022-01-17 NOTE — Telephone Encounter (Signed)
Doctor at [facility] put her on metformin pills instead of injection, has scanner on arm, can she use the scanner with the injection or does she need to prick her finger.

## 2022-01-17 NOTE — ED Provider Notes (Signed)
[facility] EMERGENCY DEPARTMENT Provider Note   CSN: [id number] Arrival date & time: 01/16/22  1521     History  Chief Complaint  Patient presents with   Abnormal Lab    Julie Duffy is a 76 y.o. female directed to the emergency department PCP after concern for significant hyperglycemia in the outpatient setting.  Patient with glucose over 400 a month ago, PCP made an adjustment to her thyroid medication and recommended follow-up.  When she followed up yesterday she was found to be  hyperglycemic with sugar greater than 500 and A1c of 15.  Drift in the emergency department with concern for severity of hyperglycemia.  Patient was provided a sample of glargine and it was documented that she was supposed to take 10 units at nighttime.  No clear explanation for why patient was not allowed to trial oral medications prior to starting insulin dosage.  Patient presented to the PCP with polydipsia and polyuria.  Was found to have glucosuria as well.  Directed to the ED due to severity of hyperglycemia.  I personally read this patient's medical records. She has history of hypothyroidism, hypertension, IBS, osteoarthritis, and hyponatremia.  HPI     Home Medications Prior to Admission medications   Medication Sig Start Date End Date Taking? Authorizing Provider  metFORMIN (GLUCOPHAGE) 500 MG tablet Take 1 tablet (500 mg total) by mouth daily with breakfast. 01/17/22 02/16/22 Yes Julie Duffy, Julie Gavia, PA-C  ALPRAZolam [name]) 0.5 MG tablet TAKE 1 TABLET TWICE DAILY 11/08/21   Julie Duffy, Julie Patricia, MD  atorvastatin (LIPITOR) 20 MG tablet Take 1 tablet (20 mg total) by mouth daily. Patient not taking: Reported on 12/06/2021 07/12/21   Julie Duffy, Julie Patricia, MD  Continuous Blood Gluc Sensor (FREESTYLE LIBRE 2 SENSOR) MISC Apply one sensor every 14 days. Use to check blood sugar. 01/16/22   Julie Duffy, Julie Patricia, MD  insulin glargine, 1 Unit Dial, (TOUJEO SOLOSTAR) 300  UNIT/ML Solostar Pen Inject 10 Units into the skin at bedtime. 01/16/22 04/16/22  Julie Duffy, Julie Patricia, MD  levothyroxine (SYNTHROID) 100 MCG tablet Take 1 tablet Mon-Fri 01/14/22   Julie Duffy, Julie Patricia, MD  levothyroxine (SYNTHROID) 125 MCG tablet Take 1 tablet Sat and Sun 01/14/22   Julie Duffy, Julie Patricia, MD      Allergies    Guaifenesin & derivatives, Clindamycin, Clindamycin/lincomycin cross reactors, Codeine, and Sulfa antibiotics    Review of Systems   Review of Systems  Endocrine: Positive for polydipsia and polyuria.    Physical Exam Updated Vital Signs BP 130/80 (BP Location: Right Arm)   Pulse 70   Temp 98.3 F (36.8 C) (Oral)   Resp 18   SpO2 97%  Physical Exam Vitals and nursing note reviewed.  Constitutional:      Appearance: She is not ill-appearing or toxic-appearing.  HENT:     Head: Normocephalic and atraumatic.     Mouth/Throat:     Mouth: Mucous membranes are moist.     Pharynx: No oropharyngeal exudate or posterior oropharyngeal erythema.  Eyes:     General:        Right eye: No discharge.        Left eye: No discharge.     Extraocular Movements: Extraocular movements intact.     Conjunctiva/sclera: Conjunctivae normal.     Pupils: Pupils are equal, round, and reactive to light.  Cardiovascular:     Rate and Rhythm: Normal rate and regular rhythm.     Pulses:  Normal pulses.     Heart sounds: Normal heart sounds. No murmur heard. Pulmonary:     Effort: Pulmonary effort is normal. No respiratory distress.     Breath sounds: Normal breath sounds. No wheezing or rales.  Abdominal:     General: Bowel sounds are normal. There is no distension.     Palpations: Abdomen is soft.     Tenderness: There is no abdominal tenderness. There is no right CVA tenderness, left CVA tenderness, guarding or rebound.  Musculoskeletal:        General: No deformity.     Cervical back: Neck supple.     Right lower leg: No edema.     Left lower leg: No edema.   Skin:    General: Skin is warm and dry.     Capillary Refill: Capillary refill takes less than 2 seconds.  Neurological:     General: No focal deficit present.     Mental Status: She is alert and oriented to person, place, and time. Mental status is at baseline.  Psychiatric:        Mood and Affect: Mood normal.     ED Results / Procedures / Treatments   Labs (all labs ordered are listed, but only abnormal results are displayed) Labs Reviewed  COMPREHENSIVE METABOLIC PANEL - Abnormal; Notable for the following components:      Result Value   Sodium 131 (*)    Chloride 94 (*)    Glucose, Bld 430 (*)    Total Bilirubin 1.3 (*)    All other components within normal limits  URINALYSIS, ROUTINE W REFLEX MICROSCOPIC - Abnormal; Notable for the following components:   Specific Gravity, Urine 1.033 (*)    Glucose, UA >=500 (*)    Ketones, ur 80 (*)    Leukocytes,Ua TRACE (*)    Bacteria, UA RARE (*)    All other components within normal limits  CBG MONITORING, ED - Abnormal; Notable for the following components:   Glucose-Capillary 307 (*)    All other components within normal limits  CBC WITH DIFFERENTIAL/PLATELET    EKG None  Radiology No results found.  Procedures Procedures    Medications Ordered in ED Medications  sodium chloride 0.9 % bolus 1,000 mL (0 mLs Intravenous Stopped 01/17/22 0543)    ED Course/ Medical Decision Making/ A&P                           Medical Decision Making 76 year old female presents with concern for hyperglycemia in the outpatient setting in context of newly diagnosed  diabetes, directed to ED by PCP.  Vital signs are normal intake.  Cardiopulmonary abdominal signs are benign.  Patient is very well-appearing.  For diagnose includes limited to nonketotic hyperglycemia, DKA, HHS, infectious etiology.  CBC without leukocytosis or anemia.  CMP with mild hyponatremia 131 and hyperglycemia 430.  UA with glucosuria greater than 500,  ketonuria but otherwise does not appear infectious in nature.  Patient Administered a bolus of IV fluids with improvement in her CBG to 300.   Patient with clearly poor understanding of role of insulin and poor understanding of her diagnosis with diabetes.  Will instruct patient to not use the previously prescribed insulin and will start patient instead on oral metformin.  Plan to have her follow-up in the outpatient setting with her PCP in the next week for reevaluation and further discussion of her diabetes.  Patient evaluated at the bedside by EDP  Dr. Madilyn Hook who agrees with plan.  Julie Duffy voiced understanding of her medical evaluation and treatment plan.  Each of her questions answered to express infection.  Return precautions given.  Patient is well-appearing, stable, and appropriate for discharge at this time.  Discharged in good condition.  This chart was dictated using voice recognition software, Dragon. Despite the best efforts of this provider to proofread and correct errors, errors may still occur which can change documentation meaning.  Final Clinical Impression(s) / ED Diagnoses Final diagnoses:  Hyperglycemia    Rx / DC Orders ED Discharge Orders          Ordered    metFORMIN (GLUCOPHAGE) 500 MG tablet  Daily with breakfast        01/17/22 0655              Asna Muldrow, Julie Gavia, PA-C 01/17/22 0732    Tilden Fossa, MD 01/19/22 1504

## 2022-01-18 ENCOUNTER — Encounter: Payer: Self-pay | Admitting: Family

## 2022-01-18 ENCOUNTER — Telehealth: Payer: Self-pay | Admitting: *Deleted

## 2022-01-18 ENCOUNTER — Ambulatory Visit (INDEPENDENT_AMBULATORY_CARE_PROVIDER_SITE_OTHER): Payer: Medicare HMO | Admitting: Family

## 2022-01-18 VITALS — BP 120/80 | HR 65 | Temp 97.5°F | Ht 64.0 in | Wt 109.8 lb

## 2022-01-18 DIAGNOSIS — R634 Abnormal weight loss: Secondary | ICD-10-CM | POA: Diagnosis not present

## 2022-01-18 DIAGNOSIS — E1165 Type 2 diabetes mellitus with hyperglycemia: Secondary | ICD-10-CM | POA: Diagnosis not present

## 2022-01-18 DIAGNOSIS — E039 Hypothyroidism, unspecified: Secondary | ICD-10-CM

## 2022-01-18 LAB — POCT GLUCOSE (DEVICE FOR HOME USE): POC Glucose: 325 mg/dl — AB (ref 70–99)

## 2022-01-18 MED ORDER — METFORMIN HCL 500 MG PO TABS: 500.0000 mg | ORAL_TABLET | Freq: Two times a day (BID) | ORAL | 0 refills | Status: DC

## 2022-01-18 MED ORDER — TRESIBA FLEXTOUCH 200 UNIT/ML ~~LOC~~ SOPN: 12.0000 [IU] | PEN_INJECTOR | Freq: Every day | SUBCUTANEOUS | 0 refills | Status: DC

## 2022-01-18 NOTE — Telephone Encounter (Signed)
Previous message should reflect Tresiba 200 units/ml.

## 2022-01-18 NOTE — Patient Instructions (Addendum)
Tresiba 12 units in the evening.  Increase by 2 units every 3 days for a fasting blood sugar greater than 130 2. Increase Metformin to 500mg  twice a day 3. Follow-up with Dr. (or me) next week.  4. Once we stabilize you, we will discuss other necessary medications and options

## 2022-01-18 NOTE — Telephone Encounter (Signed)
Padonda give a sample to the patient for Evaristo Bury 200mg  one pen- to take 12 units daily, increase by 2 units every 3 days for a blood sugar reading of greater than 130-lot# exp 08/05/2023.  Pen needles were also provided to the patient.

## 2022-01-21 ENCOUNTER — Ambulatory Visit (INDEPENDENT_AMBULATORY_CARE_PROVIDER_SITE_OTHER): Payer: Medicare HMO | Admitting: Family Medicine

## 2022-01-21 ENCOUNTER — Encounter: Payer: Self-pay | Admitting: Family Medicine

## 2022-01-21 VITALS — BP 122/70 | HR 70 | Temp 98.0°F | Ht 64.0 in | Wt 109.0 lb

## 2022-01-21 DIAGNOSIS — E119 Type 2 diabetes mellitus without complications: Secondary | ICD-10-CM

## 2022-01-21 MED ORDER — NOVOLOG FLEXPEN 100 UNIT/ML ~~LOC~~ SOPN: PEN_INJECTOR | SUBCUTANEOUS | 3 refills | Status: DC

## 2022-01-21 NOTE — Patient Instructions (Signed)
Check blood sugars prior to each meal and use the following sliding scale  For blood sugars:               Number of units of NovoLog insulin to give:  100-140                              0  141-180                              2  181-220                             4  221-260                              6  261-300                             8  301-350                            10  351-400                            12  Continue current dose of Tresiba of 6 units daily  Stay well-hydrated  Bring in log of blood sugars to review with Dr. Ardyth Harps at follow-up

## 2022-01-21 NOTE — Progress Notes (Unsigned)
Established Patient Office Visit  Subjective   Patient ID: Julie Duffy, female    DOB: [birth year]  Age: 76 y.o. MRN: [id number]  Chief Complaint  Patient presents with   Hyperglycemia    HPI  {History (Optional):23778} Recently diagnosed diabetes.  She developed urine frequency, thirst, weight loss.  Was seen here and started on long-acting insulin but ended up going to ER.  They actually started her on metformin even though her blood sugar was up over 500 and she was given some IV fluids and subsequently seen here last Friday and started on Tresiba 12 units daily.  This did help her blood sugars come down some over the weekend and she actually had 1 reading of 74 and her Evaristo Bury was reduced to 6 units.  She is taking metformin 500 mg twice daily but has had some general lower abdominal discomfort.  No diarrhea.  No nausea or vomiting.  Keeping down fluids well.  Avoiding high glycemic foods.   No family history of diabetes.  She does have hypothyroidism and is on replacement.  Past Medical History:  Diagnosis Date   Allergy    seasonal   Anxiety    Constipation    COVID-19 virus infection 09/2019   H. pylori infection    Hypertension    Hypothyroid    Scoliosis    Past Surgical History:  Procedure Laterality Date   APPENDECTOMY     NASAL SINUS SURGERY     NECK LESION BIOPSY     cysts benign   TUBAL LIGATION     UMBILICAL HERNIA REPAIR      reports that she has never smoked. She has never used smokeless tobacco. She reports that she does not drink alcohol and does not use drugs. family history includes Cancer in her father; Cancer (age of onset: 26) in her mother; Colon cancer in her mother; Heart disease in her mother; Thyroid disease in her daughter and mother. Allergies  Allergen Reactions   Guaifenesin & Derivatives Nausea And Vomiting   Clindamycin Other (See Comments)    GI intolerance   Clindamycin/Lincomycin Cross Reactors Other (See Comments)    GI  intolerance   Codeine Other (See Comments)    hallucinations   Sulfa Antibiotics Other (See Comments)    unspecified    Review of Systems  Constitutional:  Negative for chills and fever.  Respiratory:  Negative for cough.   Cardiovascular:  Negative for chest pain.  Genitourinary:  Negative for dysuria.  Endo/Heme/Allergies:  Positive for polydipsia.      Objective:     BP 122/70 (BP Location: Left Arm, Patient Position: Sitting, Cuff Size: Normal)   Pulse 70   Temp 98 F (36.7 C) (Oral)   Ht 5\' 4"  (1.626 m)   Wt 109 lb (49.4 kg)   SpO2 99%   BMI 18.71 kg/m  BP Readings from Last 3 Encounters:  01/21/22 122/70  01/18/22 120/80  01/17/22 130/80   Wt Readings from Last 3 Encounters:  01/21/22 109 lb (49.4 kg)  01/18/22 109 lb 12.8 oz (49.8 kg)  01/16/22 109 lb 8 oz (49.7 kg)      Physical Exam Vitals reviewed.  Constitutional:      Appearance: Normal appearance.  Cardiovascular:     Rate and Rhythm: Normal rate and regular rhythm.  Pulmonary:     Effort: Pulmonary effort is normal.     Breath sounds: Normal breath sounds. No wheezing or rales.  Musculoskeletal:  Right lower leg: No edema.     Left lower leg: No edema.  Neurological:     Mental Status: She is alert.      No results found for any visits on 01/21/22.  {Labs (Optional):23779}  The 10-year ASCVD risk score (Arnett DK, et al., 2019) is: 30.2%    Assessment & Plan:   New onset diabetes.  Patient currently on Tresiba 6 units daily.  Somewhat labile blood sugars but most of her blood sugars have been up over 300.  She is very concerned regarding postprandial rise in blood sugars which she is seeing after meals.  Suspect based on her lack of family history and body habitus that she may have some insulin deficiency.  She appears adequately hydrated and vitals are stable this time.  -Add NovoLog FlexPen with sliding scale given to use 3 times daily with meals.  We chose conservative scale  based on her age and weight.  We explained that we may need to modify this even further. -Continue Tresiba 6 units daily -She is aware of importance of good hydration -Schedule follow-up soon with Dr. Ardyth Harps  No follow-ups on file.    Evelena Peat, MD

## 2022-01-22 ENCOUNTER — Encounter: Payer: Medicare HMO | Attending: Internal Medicine | Admitting: Dietician

## 2022-01-22 ENCOUNTER — Other Ambulatory Visit: Payer: Self-pay | Admitting: Family Medicine

## 2022-01-22 ENCOUNTER — Encounter: Payer: Self-pay | Admitting: Dietician

## 2022-01-22 DIAGNOSIS — E119 Type 2 diabetes mellitus without complications: Secondary | ICD-10-CM | POA: Diagnosis not present

## 2022-01-22 MED ORDER — FREESTYLE LIBRE 2 READER DEVI: 1 refills | Status: DC

## 2022-01-22 NOTE — Progress Notes (Signed)
Patient was seen on 01/22/2022 for the first of a series of three diabetes self-management courses at the [facility].  Patient Education Plan per assessed needs and concerns is to attend three course education program for Diabetes Self Management Education.  A1C was 15% on 01/16/2022.Marland Kitchen  The following learning objectives were met by the patient during this class: Describe diabetes, types of diabetes and pathophysiology State some common risk factors for diabetes Defines the role of glucose and insulin Describe the relationship between diabetes and cardiovascular and other risks State the members of the Healthcare Team States the rationale for glucose monitoring and when to test State their individual [place] the importance of logging glucose readings and how to interpret the readings Identifies A1C target Explain the correlation between A1c and eAG values State symptoms and treatment of high blood glucose and low blood glucose Explain proper technique for glucose testing and identify proper sharps disposal  Handouts given during class include: How to Thrive:  A Guide for Your Journey with Diabetes by the ADA Meal Plan Card and carbohydrate content list Dietary intake form Low Sodium Flavoring Tips Types of Fats Dining Out Label reading Snack list The diabetes portion plate Diabetes Resources A1c to eAG Conversion Chart Blood Glucose Log Diabetes Recommended Care Schedule Support Group Diabetes Success Plan Core Class Satisfaction Survey   Follow-Up Plan: Attend core 2

## 2022-01-22 NOTE — Progress Notes (Signed)
Acute Office Visit  Subjective:     Patient ID: Julie Duffy, female    DOB: [birth year], 76 y.o.   MRN: [id number]  Chief Complaint  Patient presents with  . Diabetes    Patient complains of elevated glucose readings of over 500 and 357 last night, states the glucometer was unable to provide a reading today    HPI Patient is in today with concerns of uncontrolled Type 2 DM that was recently diagnosed. She reports having weight loss and fatigue. She has had glucose readings 300-575. Dr. Ardyth Harps gave her Toujeo but she has not used it because she has not had any needles. She is currently taking Metfomin 500 mg once daily and report mild nausea. Today she was concerned because the glucometer was unable to give her a reading. She has a history of hypothyroidism. Husband present and very supportive  Review of Systems  Constitutional:  Positive for malaise/fatigue and weight loss.  Eyes:  Negative for blurred vision and double vision.  Neurological:  Negative for dizziness.  Psychiatric/Behavioral: Negative.    All other systems reviewed and are negative.      Objective:    BP 120/80 (BP Location: Left Arm, Patient Position: Sitting, Cuff Size: Normal)   Pulse 65   Temp (!) 97.5 F (36.4 C) (Oral)   Ht 5\' 4"  (1.626 m)   Wt 109 lb 12.8 oz (49.8 kg)   SpO2 98%   BMI 18.85 kg/m    Physical Exam Vitals and nursing note reviewed.  Constitutional:      Appearance: Normal appearance.  HENT:     Right Ear: Tympanic membrane and ear canal normal.     Left Ear: Tympanic membrane and ear canal normal.  Eyes:     Pupils: Pupils are equal, round, and reactive to light.  Cardiovascular:     Rate and Rhythm: Normal rate and regular rhythm.     Pulses: Normal pulses.     Heart sounds: Normal heart sounds.  Pulmonary:     Effort: Pulmonary effort is normal.     Breath sounds: Normal breath sounds.  Abdominal:     General: Abdomen is flat.     Palpations: Abdomen is soft.   Musculoskeletal:        General: Normal range of motion.     Cervical back: Normal range of motion and neck supple.  Neurological:     General: No focal deficit present.     Mental Status: She is alert and oriented to person, place, and time.  Psychiatric:        Mood and Affect: Mood normal.        Behavior: Behavior normal.   Results for orders placed or performed in visit on 01/18/22  POCT Glucose (Device for Home Use)  Result Value Ref Range   Glucose Fasting, POC     POC Glucose 325 (A) 70 - 99 mg/dl        Assessment & Plan:   Problem List Items Addressed This Visit     Hypothyroidism   Other Visit Diagnoses     Type 2 diabetes mellitus with hyperglycemia, without long-term current use of insulin (HCC)    -  Primary   Relevant Medications   insulin degludec (TRESIBA FLEXTOUCH) 200 UNIT/ML FlexTouch Pen   metFORMIN (GLUCOPHAGE) 500 MG tablet   Other Relevant Orders   Ambulatory referral to Endocrinology   POCT Glucose (Device for Home Use) (Completed)   Weight loss  Meds ordered this encounter  Medications  . insulin degludec (TRESIBA FLEXTOUCH) 200 UNIT/ML FlexTouch Pen    Sig: Inject 12 Units into the skin daily.    Dispense:  200 mL    Refill:  0  . metFORMIN (GLUCOPHAGE) 500 MG tablet    Sig: Take 1 tablet (500 mg total) by mouth 2 (two) times daily with a meal.    Dispense:  60 tablet    Refill:  0     Return in 4 days.   Eulis Foster, FNP

## 2022-01-24 ENCOUNTER — Telehealth: Payer: Self-pay | Admitting: Internal Medicine

## 2022-01-24 NOTE — Telephone Encounter (Signed)
Pt called in stating someone called her from the office, requested to speak with nurse, no details given on why

## 2022-01-24 NOTE — Telephone Encounter (Signed)
Left message on machine for patient to return our call.  See lab result note. 

## 2022-01-25 ENCOUNTER — Ambulatory Visit (INDEPENDENT_AMBULATORY_CARE_PROVIDER_SITE_OTHER): Payer: Medicare HMO | Admitting: Family

## 2022-01-25 VITALS — BP 110/64 | HR 54 | Temp 98.4°F | Wt 114.6 lb

## 2022-01-25 DIAGNOSIS — E782 Mixed hyperlipidemia: Secondary | ICD-10-CM

## 2022-01-25 DIAGNOSIS — I10 Essential (primary) hypertension: Secondary | ICD-10-CM

## 2022-01-25 DIAGNOSIS — E119 Type 2 diabetes mellitus without complications: Secondary | ICD-10-CM

## 2022-01-25 MED ORDER — INSULIN PEN NEEDLE 31G X 5 MM MISC: 3.0000 [IU] | Freq: Every day | 3 refills | Status: AC

## 2022-01-28 ENCOUNTER — Encounter: Payer: Self-pay | Admitting: Family

## 2022-01-28 NOTE — Progress Notes (Signed)
Acute Office Visit  Subjective:     Patient ID: Julie Duffy, female    DOB: [birth year], 76 y.o.   MRN: [id number]  Chief Complaint  Patient presents with  . Follow-up    DM    HPI Patient is in today as a follow-up from a few days of Diabetes. She has seen Dr. Caryl Never who decreased Evaristo Bury to 6 unites from 12 units. She also reports having blood sugars that made her nervous so she decreased herself down to 4 units. She reports using a sliding scale and has become much more comfortable with glucose. She would like a referral to Endocrinology and has suggested labs from her diabetes educator that she would like to have drawn today (c-peptide and GAD). Patient eventually would like to stop [place] because it burns her skin  Review of Systems  Endo/Heme/Allergies:  Negative for environmental allergies and polydipsia. Does not bruise/bleed easily.  All other systems reviewed and are negative.       Objective:    BP 110/64 (BP Location: Right Arm, Patient Position: Sitting, Cuff Size: Normal)   Pulse (!) 54   Temp 98.4 F (36.9 C) (Oral)   Wt 114 lb 9.6 oz (52 kg)   SpO2 99%   BMI 19.67 kg/m    Physical Exam Vitals and nursing note reviewed.  Constitutional:      Appearance: Normal appearance.  HENT:     Right Ear: Ear canal and external ear normal.     Left Ear: Ear canal and external ear normal.  Eyes:     Pupils: Pupils are equal, round, and reactive to light.  Cardiovascular:     Rate and Rhythm: Normal rate and regular rhythm.  Pulmonary:     Effort: Pulmonary effort is normal.     Breath sounds: Normal breath sounds.  Abdominal:     General: Abdomen is flat.     Palpations: Abdomen is soft.  Musculoskeletal:        General: Normal range of motion.     Cervical back: Normal range of motion and neck supple.  Skin:    General: Skin is warm and dry.     Comments: Monofilament intact. Feet skin intact  Neurological:     General: No focal deficit present.      Mental Status: She is alert and oriented to person, place, and time.  Psychiatric:        Mood and Affect: Mood normal.        Behavior: Behavior normal.    No results found for any visits on 01/25/22.      Assessment & Plan:   Problem List Items Addressed This Visit     Hyperlipidemia   Essential hypertension   Other Visit Diagnoses     Diabetes mellitus, new onset (HCC)    -  Primary   Relevant Orders   C-peptide   Glutamic acid decarboxylase auto abs   Ambulatory referral to Endocrinology       Meds ordered this encounter  Medications  . Insulin Pen Needle 31G X 5 MM MISC    Sig: 3 Units by Does not apply route daily.    Dispense:  30 each    Refill:  3   Referrals placed. Once stable. We need to add an ACE or ARB to treatment. Continue Tresiba at 4 units. Follow-up with endocrinology and await labs. Call as needed with concerns.  No follow-ups on file.  Eulis Foster, FNP

## 2022-01-29 ENCOUNTER — Encounter: Payer: Self-pay | Admitting: Dietician

## 2022-01-29 ENCOUNTER — Encounter: Payer: Medicare HMO | Attending: Internal Medicine | Admitting: Dietician

## 2022-01-29 DIAGNOSIS — E119 Type 2 diabetes mellitus without complications: Secondary | ICD-10-CM | POA: Diagnosis not present

## 2022-01-30 ENCOUNTER — Other Ambulatory Visit (INDEPENDENT_AMBULATORY_CARE_PROVIDER_SITE_OTHER): Payer: Medicare HMO

## 2022-01-30 ENCOUNTER — Telehealth: Payer: Self-pay | Admitting: *Deleted

## 2022-01-30 DIAGNOSIS — E119 Type 2 diabetes mellitus without complications: Secondary | ICD-10-CM | POA: Diagnosis not present

## 2022-01-30 NOTE — Telephone Encounter (Signed)
-----   Message from Eulis Foster, FNP sent at 01/30/2022  9:33 AM EDT ----- It looks like she left and did not get labs done. Will you call her? ----- Message ----- From: SYSTEM Sent: 01/30/2022  12:16 AM EDT To: Eulis Foster, FNP

## 2022-01-30 NOTE — Telephone Encounter (Signed)
Spoke with the patient and scheduled a lab appt today at 3pm.

## 2022-01-30 NOTE — Telephone Encounter (Signed)
-----   Message from Eulis Foster, FNP sent at 01/30/2022  9:33 AM EDT ----- Did not get labs done ----- Message ----- From: SYSTEM Sent: 01/30/2022  12:16 AM EDT To: Eulis Foster, FNP

## 2022-01-30 NOTE — Telephone Encounter (Signed)
Spoke with the patient and scheduled a lab appt today at 3pm. 

## 2022-01-31 ENCOUNTER — Ambulatory Visit (INDEPENDENT_AMBULATORY_CARE_PROVIDER_SITE_OTHER): Payer: Medicare HMO | Admitting: Internal Medicine

## 2022-01-31 ENCOUNTER — Other Ambulatory Visit: Payer: Self-pay | Admitting: Internal Medicine

## 2022-01-31 ENCOUNTER — Encounter: Payer: Self-pay | Admitting: Internal Medicine

## 2022-01-31 VITALS — BP 110/70 | HR 60 | Temp 98.0°F | Wt 109.2 lb

## 2022-01-31 DIAGNOSIS — E119 Type 2 diabetes mellitus without complications: Secondary | ICD-10-CM | POA: Diagnosis not present

## 2022-01-31 DIAGNOSIS — E782 Mixed hyperlipidemia: Secondary | ICD-10-CM | POA: Diagnosis not present

## 2022-01-31 DIAGNOSIS — H538 Other visual disturbances: Secondary | ICD-10-CM

## 2022-01-31 DIAGNOSIS — E039 Hypothyroidism, unspecified: Secondary | ICD-10-CM | POA: Diagnosis not present

## 2022-01-31 DIAGNOSIS — I10 Essential (primary) hypertension: Secondary | ICD-10-CM

## 2022-01-31 DIAGNOSIS — E871 Hypo-osmolality and hyponatremia: Secondary | ICD-10-CM

## 2022-01-31 DIAGNOSIS — E1165 Type 2 diabetes mellitus with hyperglycemia: Secondary | ICD-10-CM

## 2022-01-31 MED ORDER — TRUEPLUS LANCETS 33G MISC: 1.0000 | Freq: Every day | 3 refills | Status: AC

## 2022-01-31 MED ORDER — TRUE METRIX AIR GLUCOSE METER DEVI: 1.0000 | Freq: Every day | 3 refills | Status: AC

## 2022-01-31 MED ORDER — RELION TRUE METRIX TEST STRIPS VI STRP: 1.0000 | ORAL_STRIP | Freq: Every day | 12 refills | Status: DC

## 2022-01-31 MED ORDER — TRESIBA FLEXTOUCH 200 UNIT/ML ~~LOC~~ SOPN: 6.0000 [IU] | PEN_INJECTOR | Freq: Every day | SUBCUTANEOUS | 0 refills | Status: AC

## 2022-01-31 MED ORDER — FREESTYLE LIBRE 3 SENSOR MISC: 2 refills | Status: AC

## 2022-01-31 MED ORDER — FREESTYLE LIBRE 2 READER DEVI: 1 refills | Status: DC

## 2022-01-31 NOTE — Progress Notes (Signed)
Established Patient Office Visit     CC/Reason for Visit: Follow-up newly diagnosed diabetes  HPI: Julie Duffy is a 76 y.o. female who is coming in today for the above mentioned reasons. Past Medical History is significant for: Hypothyroidism, vitamin D deficiency, newly diagnosed diabetes, hyperlipidemia.  She was just diagnosed with diabetes 2 weeks ago.  She did require an ED visit for IV fluids.  She has been seen a couple more times in clinic since then by other providers.  She was advised to take [place] and NovoLog.  She decided not to take [place].  Her CBGs appear to be in the upper 100s to 200s on average.  She is no longer taking metformin which is appropriate.  She had her first nutrition class recently.  She has yet to see endocrinology but referral has been placed.  Her vision has been very blurry lately and she is having a hard time seeing.   Past Medical/Surgical History: Past Medical History:  Diagnosis Date   Allergy    seasonal   Anxiety    Constipation    COVID-19 virus infection 09/2019   Diabetes mellitus without complication (HCC)    H. pylori infection    Hypertension    Hypothyroid    Scoliosis     Past Surgical History:  Procedure Laterality Date   APPENDECTOMY     NASAL SINUS SURGERY     NECK LESION BIOPSY     cysts benign   TUBAL LIGATION     UMBILICAL HERNIA REPAIR      Social History:  reports that she has never smoked. She has never used smokeless tobacco. She reports that she does not drink alcohol and does not use drugs.  Allergies: Allergies  Allergen Reactions   Guaifenesin & Derivatives Nausea And Vomiting   Clindamycin Other (See Comments)    GI intolerance   Clindamycin/Lincomycin Cross Reactors Other (See Comments)    GI intolerance   Codeine Other (See Comments)    hallucinations   Sulfa Antibiotics Other (See Comments)    unspecified    Family History:  Family History  Problem Relation Age of Onset   Cancer  Mother 31       colon ca   Heart disease Mother    Colon cancer Mother    Thyroid disease Mother    Cancer Father    Thyroid disease Daughter    Esophageal cancer Neg Hx    Stomach cancer Neg Hx      Current Outpatient Medications:    ALPRAZolam (XANAX) 0.5 MG tablet, TAKE 1 TABLET TWICE DAILY, Disp: 60 tablet, Rfl: 0   atorvastatin (LIPITOR) 20 MG tablet, Take 1 tablet (20 mg total) by mouth daily., Disp: 90 tablet, Rfl: 1   Blood Glucose Monitoring Suppl (TRUE METRIX AIR GLUCOSE METER) DEVI, 1 each by Does not apply route daily., Disp: 1 each, Rfl: 3   Continuous Blood Gluc Receiver (FREESTYLE LIBRE 2 READER) DEVI, Use once daily for glucose control, Disp: 1 each, Rfl: 1   Continuous Blood Gluc Sensor (FREESTYLE LIBRE 2 SENSOR) MISC, Apply one sensor every 14 days. Use to check blood sugar., Disp: 1 each, Rfl: 6   Continuous Blood Gluc Sensor (FREESTYLE LIBRE 3 SENSOR) MISC, Use one daily for glucose control. Dx E11.9, Disp: 2 each, Rfl: 2   glucose blood (RELION TRUE METRIX TEST STRIPS) test strip, 1 each by Other route daily., Disp: 100 each, Rfl: 12   insulin aspart (NOVOLOG  FLEXPEN) 100 UNIT/ML FlexPen, Use NovoLog subcutaneous 3 times daily with meals as per sliding scale, Disp: 9 mL, Rfl: 3   Insulin Pen Needle 31G X 5 MM MISC, 3 Units by Does not apply route daily., Disp: 30 each, Rfl: 3   levothyroxine (SYNTHROID) 100 MCG tablet, Take 1 tablet Mon-Fri, Disp: 90 tablet, Rfl: 1   levothyroxine (SYNTHROID) 125 MCG tablet, Take 1 tablet Sat and Sun, Disp: 90 tablet, Rfl: 1   metFORMIN (GLUCOPHAGE) 500 MG tablet, Take 1 tablet (500 mg total) by mouth 2 (two) times daily with a meal., Disp: 60 tablet, Rfl: 0   TRUEplus Lancets 33G MISC, 1 each by Does not apply route daily., Disp: 100 each, Rfl: 3   insulin degludec (TRESIBA FLEXTOUCH) 200 UNIT/ML FlexTouch Pen, Inject 6 Units into the skin daily., Disp: 200 mL, Rfl: 0  Review of Systems:  Constitutional: Denies fever, chills,  diaphoresis, appetite change.  HEENT: Denies photophobia, eye pain, redness, hearing loss, ear pain, congestion, sore throat, rhinorrhea, sneezing, mouth sores, trouble swallowing, neck pain, neck stiffness and tinnitus.   Respiratory: Denies SOB, DOE, cough, chest tightness,  and wheezing.   Cardiovascular: Denies chest pain, palpitations and leg swelling.  Gastrointestinal: Denies nausea, vomiting, abdominal pain, diarrhea, constipation, blood in stool and abdominal distention.  Genitourinary: Denies dysuria, urgency, frequency, hematuria, flank pain and difficulty urinating.  Endocrine: Denies: hot or cold intolerance, sweats, changes in hair or nails, polyuria, polydipsia. Musculoskeletal: Denies myalgias, back pain, joint swelling, arthralgias and gait problem.  Skin: Denies pallor, rash and wound.  Neurological: Denies dizziness, seizures, syncope, weakness, light-headedness, numbness and headaches.  Hematological: Denies adenopathy. Easy bruising, personal or family bleeding history  Psychiatric/Behavioral: Denies suicidal ideation, mood changes, confusion, nervousness, sleep disturbance and agitation    Physical Exam: Vitals:   01/31/22 1354  BP: 110/70  Pulse: 60  Temp: 98 F (36.7 C)  TempSrc: Oral  SpO2: 98%  Weight: 109 lb 3.2 oz (49.5 kg)    Body mass index is 18.74 kg/m.   Constitutional: NAD, calm, comfortable Eyes: PERRL, lids and conjunctivae normal ENMT: Mucous membranes are moist.  Respiratory: clear to auscultation bilaterally, no wheezing, no crackles. Normal respiratory effort. No accessory muscle use.  Cardiovascular: Regular rate and rhythm, no murmurs / rubs / gallops. No extremity edema.  Psychiatric: Normal judgment and insight. Alert and oriented x 3. Normal mood.    Impression and Plan:  Diabetes mellitus, new onset (HCC) -Have advised her to resume [place] at 5 units at bedtime as well as NovoLog sliding scale with meals only. -Freestyle libre  with reader will be sent as her phone is not compatible with it. -Follow-up in 2 weeks.  Acquired hypothyroidism -On levothyroxine  Mixed hyperlipidemia -Recheck lipids next visit.  Now that she is a diabetic goal LDL is less than 70.  Essential hypertension -Well-controlled  Blurry vision, bilateral -Urgent referral to ophthalmology placed    Time spent:33 minutes reviewing chart, interviewing and examining patient and formulating plan of care.    Chaya Jan, MD [facility] at [facility]

## 2022-02-01 ENCOUNTER — Telehealth: Payer: Self-pay | Admitting: Internal Medicine

## 2022-02-01 ENCOUNTER — Other Ambulatory Visit: Payer: Self-pay | Admitting: Internal Medicine

## 2022-02-01 DIAGNOSIS — H52223 Regular astigmatism, bilateral: Secondary | ICD-10-CM | POA: Diagnosis not present

## 2022-02-01 DIAGNOSIS — E782 Mixed hyperlipidemia: Secondary | ICD-10-CM

## 2022-02-01 DIAGNOSIS — H5203 Hypermetropia, bilateral: Secondary | ICD-10-CM | POA: Diagnosis not present

## 2022-02-01 LAB — HM DIABETES EYE EXAM

## 2022-02-01 NOTE — Telephone Encounter (Signed)
Pt asked for a call back to go over her lab results.  Please call  [phone]

## 2022-02-03 LAB — C-PEPTIDE: C-Peptide: 1.15 ng/mL (ref 0.80–3.85)

## 2022-02-03 LAB — GLUTAMIC ACID DECARBOXYLASE AUTO ABS: Glutamic Acid Decarb Ab: >250 IU/mL — ABNORMAL HIGH (ref <5)

## 2022-02-04 ENCOUNTER — Telehealth: Payer: Self-pay | Admitting: Internal Medicine

## 2022-02-04 NOTE — Telephone Encounter (Signed)
Patient is aware Patient was not fasting Lab appointment scheduled

## 2022-02-04 NOTE — Telephone Encounter (Signed)
Pt called to check on the status of her lab results from 01/31/22. Pt stated if the results are not clear she can come back and do another blood work. Please call pt once results are in.   Please advise.

## 2022-02-07 ENCOUNTER — Other Ambulatory Visit: Payer: Medicare HMO

## 2022-02-12 ENCOUNTER — Ambulatory Visit (INDEPENDENT_AMBULATORY_CARE_PROVIDER_SITE_OTHER): Payer: Medicare HMO | Admitting: Internal Medicine

## 2022-02-12 ENCOUNTER — Encounter: Payer: Medicare HMO | Attending: Internal Medicine

## 2022-02-12 ENCOUNTER — Encounter: Payer: Self-pay | Admitting: Internal Medicine

## 2022-02-12 ENCOUNTER — Other Ambulatory Visit: Payer: Self-pay | Admitting: *Deleted

## 2022-02-12 VITALS — BP 120/80 | HR 51 | Temp 97.8°F | Wt 109.6 lb

## 2022-02-12 DIAGNOSIS — E119 Type 2 diabetes mellitus without complications: Secondary | ICD-10-CM

## 2022-02-12 DIAGNOSIS — E871 Hypo-osmolality and hyponatremia: Secondary | ICD-10-CM | POA: Diagnosis not present

## 2022-02-12 DIAGNOSIS — R5383 Other fatigue: Secondary | ICD-10-CM | POA: Diagnosis not present

## 2022-02-12 DIAGNOSIS — E139 Other specified diabetes mellitus without complications: Secondary | ICD-10-CM

## 2022-02-12 DIAGNOSIS — E039 Hypothyroidism, unspecified: Secondary | ICD-10-CM

## 2022-02-12 LAB — MICROALBUMIN / CREATININE URINE RATIO
Creatinine,U: 73.1 mg/dL
Microalb Creat Ratio: 1.1 mg/g (ref 0.0–30.0)
Microalb, Ur: 0.8 mg/dL (ref 0.0–1.9)

## 2022-02-12 MED ORDER — ALPRAZOLAM 0.5 MG PO TABS: 0.5000 mg | ORAL_TABLET | Freq: Two times a day (BID) | ORAL | 0 refills | Status: DC

## 2022-02-12 MED ORDER — FREESTYLE LIBRE 2 READER DEVI: 1 refills | Status: DC

## 2022-02-12 NOTE — Progress Notes (Signed)
Established Patient Office Visit     CC/Reason for Visit: Follow-up newly diagnosed diabetes  HPI: Julie Duffy is a 76 y.o. female who is coming in today for the above mentioned reasons. Past Medical History is significant for: Hypothyroidism, hyperlipidemia, vitamin D deficiency and newly diagnosed diabetes.  She was scheduled a 2-week follow-up.  Unfortunately she does not bring her CBG measurements.  She has found the continuous glucose monitor to be cumbersome and prefers to do CBG checks.  She tells me that her fasting blood sugars are usually in the 70-118 range.  Upon diagnosis in June her A1c was greater than 15.  Her weight has finally stabilized.  She had been having significant weight loss.  She has her first appointment with endocrinology scheduled for August.  Currently she is only on Tresiba 5 units.  She has not been taking NovoLog sliding scale.  She went to her first nutritionist appointment.  Her GAD antibodies were quite elevated suggesting that she might be type I, possibly LADA.  She will likely need insulin long-term but will leave this decision to endocrine.  She has been complaining of blurry vision, she did see an ophthalmologist, she was not noted to have any diabetic retinopathy she did have some mild incipient cataracts and glasses were prescribed.  Copy of eye exam will be placed in chart.   Past Medical/Surgical History: Past Medical History:  Diagnosis Date   Allergy    seasonal   Anxiety    Constipation    COVID-19 virus infection 09/2019   Diabetes mellitus without complication (HCC)    H. pylori infection    Hypertension    Hypothyroid    Scoliosis     Past Surgical History:  Procedure Laterality Date   APPENDECTOMY     NASAL SINUS SURGERY     NECK LESION BIOPSY     cysts benign   TUBAL LIGATION     UMBILICAL HERNIA REPAIR      Social History:  reports that she has never smoked. She has never used smokeless tobacco. She reports that  she does not drink alcohol and does not use drugs.  Allergies: Allergies  Allergen Reactions   Guaifenesin & Derivatives Nausea And Vomiting   Clindamycin Other (See Comments)    GI intolerance   Clindamycin/Lincomycin Cross Reactors Other (See Comments)    GI intolerance   Codeine Other (See Comments)    hallucinations   Sulfa Antibiotics Other (See Comments)    unspecified    Family History:  Family History  Problem Relation Age of Onset   Cancer Mother 28       colon ca   Heart disease Mother    Colon cancer Mother    Thyroid disease Mother    Cancer Father    Thyroid disease Daughter    Esophageal cancer Neg Hx    Stomach cancer Neg Hx      Current Outpatient Medications:    atorvastatin (LIPITOR) 20 MG tablet, TAKE 1 TABLET BY MOUTH EVERY DAY, Disp: 90 tablet, Rfl: 1   Blood Glucose Monitoring Suppl (TRUE METRIX AIR GLUCOSE METER) DEVI, 1 each by Does not apply route daily., Disp: 1 each, Rfl: 3   Continuous Blood Gluc Receiver (FREESTYLE LIBRE 2 READER) DEVI, Use once daily for glucose control, Disp: 1 each, Rfl: 1   Continuous Blood Gluc Sensor (FREESTYLE LIBRE 2 SENSOR) MISC, Apply one sensor every 14 days. Use to check blood sugar., Disp: 1 each,  Rfl: 6   Continuous Blood Gluc Sensor (FREESTYLE LIBRE 3 SENSOR) MISC, Use one daily for glucose control. Dx E11.9, Disp: 2 each, Rfl: 2   glucose blood (RELION TRUE METRIX TEST STRIPS) test strip, 1 each by Other route daily., Disp: 100 each, Rfl: 12   insulin aspart (NOVOLOG FLEXPEN) 100 UNIT/ML FlexPen, Use NovoLog subcutaneous 3 times daily with meals as per sliding scale, Disp: 9 mL, Rfl: 3   insulin degludec (TRESIBA FLEXTOUCH) 200 UNIT/ML FlexTouch Pen, Inject 6 Units into the skin daily., Disp: 200 mL, Rfl: 0   Insulin Pen Needle 31G X 5 MM MISC, 3 Units by Does not apply route daily., Disp: 30 each, Rfl: 3   levothyroxine (SYNTHROID) 100 MCG tablet, Take 1 tablet Mon-Fri, Disp: 90 tablet, Rfl: 1   levothyroxine  (SYNTHROID) 125 MCG tablet, Take 1 tablet Sat and Sun, Disp: 90 tablet, Rfl: 1   metFORMIN (GLUCOPHAGE) 500 MG tablet, Take 1 tablet (500 mg total) by mouth 2 (two) times daily with a meal., Disp: 60 tablet, Rfl: 0   TRUEplus Lancets 33G MISC, 1 each by Does not apply route daily., Disp: 100 each, Rfl: 3   ALPRAZolam (XANAX) 0.5 MG tablet, Take 1 tablet (0.5 mg total) by mouth 2 (two) times daily., Disp: 60 tablet, Rfl: 0  Review of Systems:  Constitutional: Denies fever, chills, diaphoresis, appetite change and fatigue.  HEENT: Denies photophobia, eye pain, redness, hearing loss, ear pain, congestion, sore throat, rhinorrhea, sneezing, mouth sores, trouble swallowing, neck pain, neck stiffness and tinnitus.   Respiratory: Denies SOB, DOE, cough, chest tightness,  and wheezing.   Cardiovascular: Denies chest pain, palpitations and leg swelling.  Gastrointestinal: Denies nausea, vomiting, abdominal pain, diarrhea, constipation, blood in stool and abdominal distention.  Genitourinary: Denies dysuria, urgency, frequency, hematuria, flank pain and difficulty urinating.  Endocrine: Denies: hot or cold intolerance, sweats, changes in hair or nails, polyuria, polydipsia. Musculoskeletal: Denies myalgias, back pain, joint swelling, arthralgias and gait problem.  Skin: Denies pallor, rash and wound.  Neurological: Denies dizziness, seizures, syncope, weakness, light-headedness, numbness and headaches.  Hematological: Denies adenopathy. Easy bruising, personal or family bleeding history  Psychiatric/Behavioral: Denies suicidal ideation, mood changes, confusion, nervousness, sleep disturbance and agitation    Physical Exam: Vitals:   02/12/22 0816  BP: 120/80  Pulse: (!) 51  Temp: 97.8 F (36.6 C)  TempSrc: Oral  SpO2: 97%  Weight: 109 lb 9.6 oz (49.7 kg)    Body mass index is 18.81 kg/m.   Constitutional: NAD, calm, comfortable Eyes: PERRL, lids and conjunctivae normal ENMT: Mucous  membranes are moist.  Respiratory: clear to auscultation bilaterally, no wheezing, no crackles. Normal respiratory effort. No accessory muscle use.  Cardiovascular: Regular rate and rhythm, no murmurs / rubs / gallops. No extremity edema.  Psychiatric: Normal judgment and insight. Alert and oriented x 3. Normal mood.    Impression and Plan:  LADA (latent autoimmune diabetes in adults), managed as type 1 (HCC)  - Plan: Microalbumin/Creatinine Ratio, Urine, Microalbumin/Creatinine Ratio, Urine -She will be seeing endocrinology in August. -Hard to adjust her insulin without CBGs, I suspect given her fasting CBGs are within range that we cannot increase her [place] any further.  Keep at 5 units for now.  1 month follow-up until she can get in to see endocrine.  Acquired hypothyroidism  - Plan: TSH  Hyponatremia  - Plan: Basic Metabolic Panel -Suspect this was falsely elevated due to significant hyperglycemia at time of initial diabetic diagnosis.  Time spent:31 minutes reviewing chart, interviewing and examining patient and formulating plan of care.    Chaya Jan, MD [facility] at [facility]

## 2022-02-20 DIAGNOSIS — E1165 Type 2 diabetes mellitus with hyperglycemia: Secondary | ICD-10-CM | POA: Diagnosis not present

## 2022-02-20 DIAGNOSIS — H25813 Combined forms of age-related cataract, bilateral: Secondary | ICD-10-CM | POA: Diagnosis not present

## 2022-02-20 DIAGNOSIS — H35372 Puckering of macula, left eye: Secondary | ICD-10-CM | POA: Diagnosis not present

## 2022-02-20 DIAGNOSIS — H33312 Horseshoe tear of retina without detachment, left eye: Secondary | ICD-10-CM | POA: Diagnosis not present

## 2022-02-26 ENCOUNTER — Telehealth: Payer: Self-pay | Admitting: Internal Medicine

## 2022-02-26 NOTE — Telephone Encounter (Signed)
Rx already sent Continuous Blood Gluc Sensor (FREESTYLE LIBRE 2 SENSOR) MISC 1 each 6 01/16/2022    Sig: Apply one sensor every 14 days. Use to check blood sugar.   Sent to pharmacy as: Continuous Blood Gluc Sensor (FREESTYLE LIBRE 2 SENSOR) Misc   E-Prescribing Status: Receipt confirmed by pharmacy (01/16/2022 12:00 PM EDT)

## 2022-02-26 NOTE — Telephone Encounter (Signed)
Pt would like to know if the fax regarding her Josephine Igo 2 was ever received.  Pt states it was faxed several times in the last 2 weeks.  Please give her a call back:  [phone]

## 2022-02-26 NOTE — Telephone Encounter (Signed)
Pt requesting Continuous Blood Gluc Sensor (FREESTYLE LIBRE 2 SENSOR) MISC   [id number] - [place], [place] - [address] AT [facility] [address] Phone:  [phone]  Fax:  [phone]

## 2022-02-27 ENCOUNTER — Telehealth: Payer: Self-pay | Admitting: Internal Medicine

## 2022-02-27 DIAGNOSIS — E119 Type 2 diabetes mellitus without complications: Secondary | ICD-10-CM

## 2022-02-27 MED ORDER — FREESTYLE LIBRE 2 READER DEVI: 1 refills | Status: DC

## 2022-02-27 MED ORDER — FREESTYLE LIBRE 2 SENSOR MISC: 6 refills | Status: DC

## 2022-02-27 NOTE — Telephone Encounter (Signed)
Pt is calling and would like to get libre 2 through [facility] phone number is [phone] and fax number [phone]

## 2022-02-27 NOTE — Telephone Encounter (Signed)
Paper Rx faxed and confirmed.

## 2022-02-28 IMAGING — US US ABDOMEN LIMITED
1 series · 14 of 25 positions shown · non-contrast
Comparison: 05/08/2018

CLINICAL DATA: Right upper quadrant pain

EXAM:
ULTRASOUND ABDOMEN LIMITED RIGHT UPPER QUADRANT

[Series 1: us abdomen limited · 0.11mm/px · 14 of 41 slices shown]
[im 1/41]
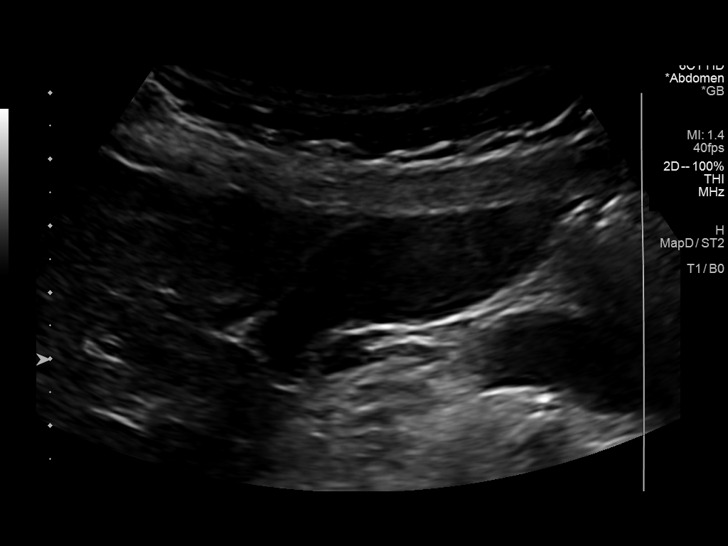
[im 4/41]
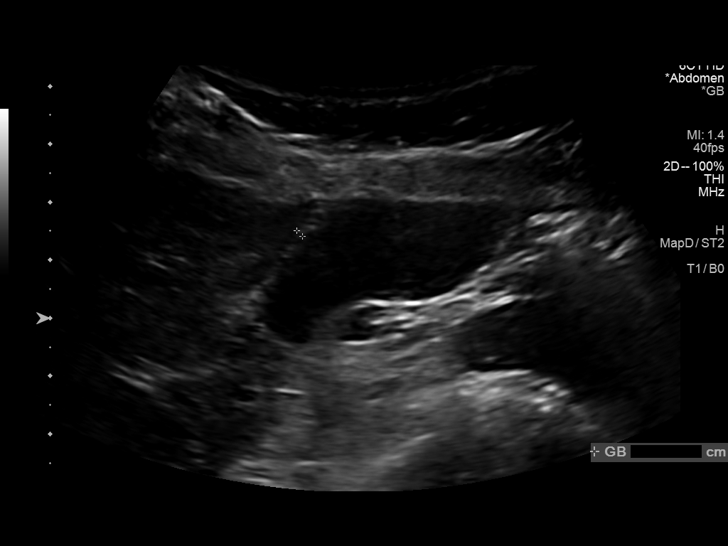
[im 7/41]
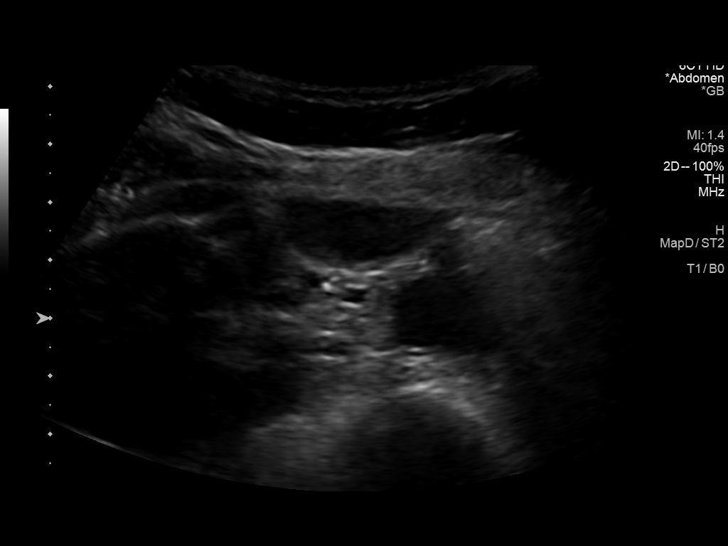
[im 11/41]
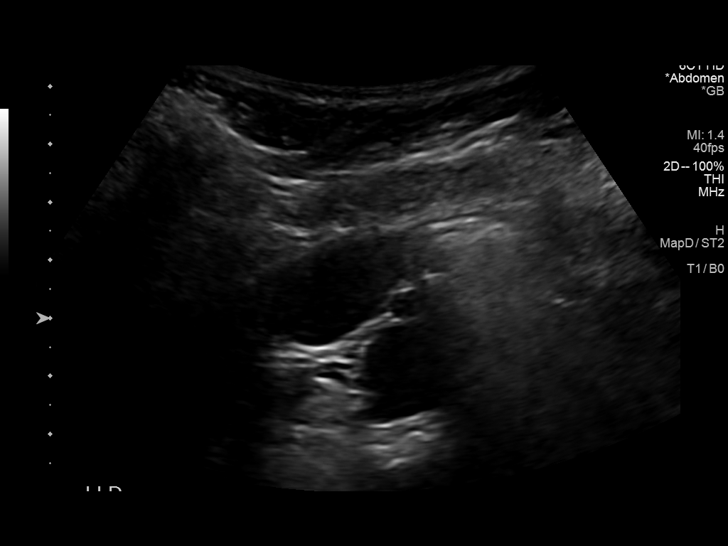
[im 14/41]
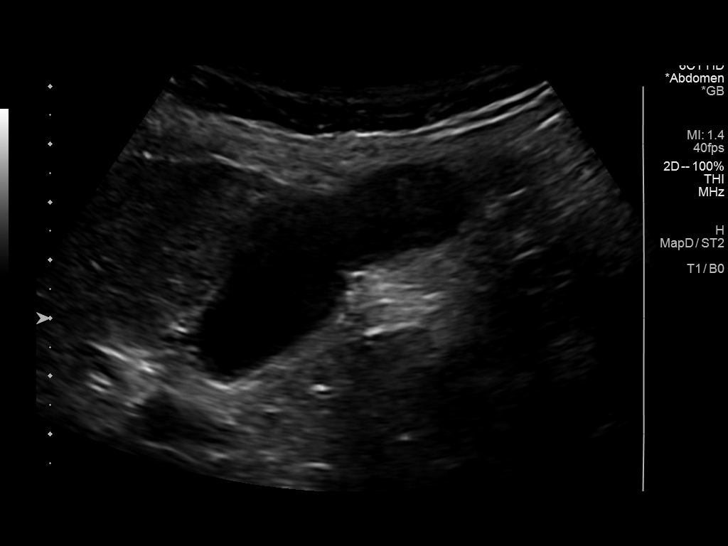
[im 16/41]
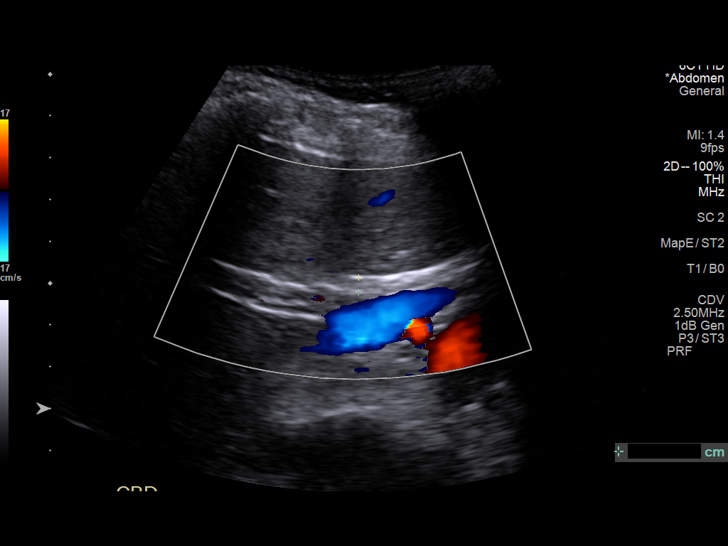
[im 19/41]
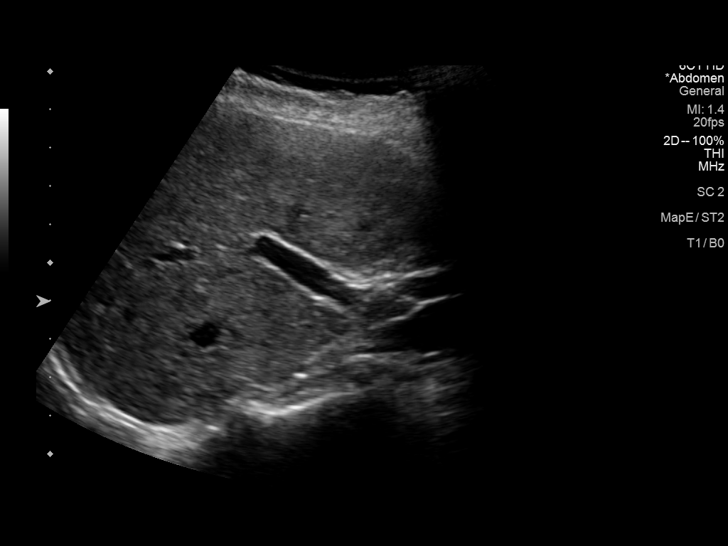
[im 22/41]
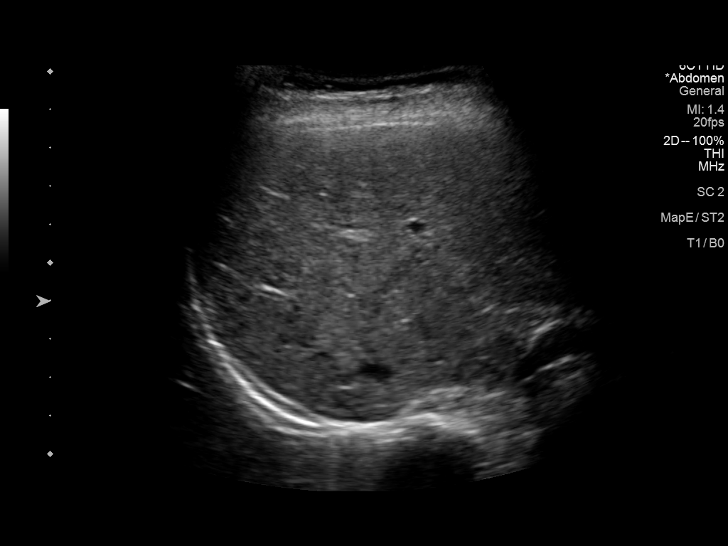
[im 26/41]
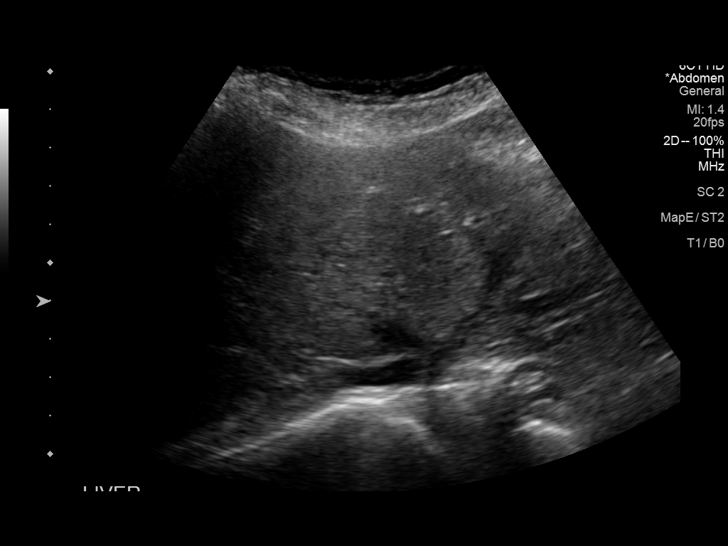
[im 27/41]
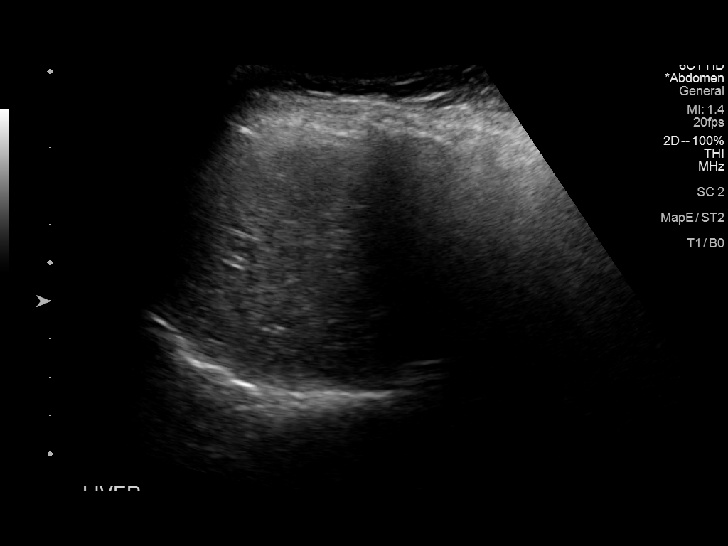
[im 31/41]
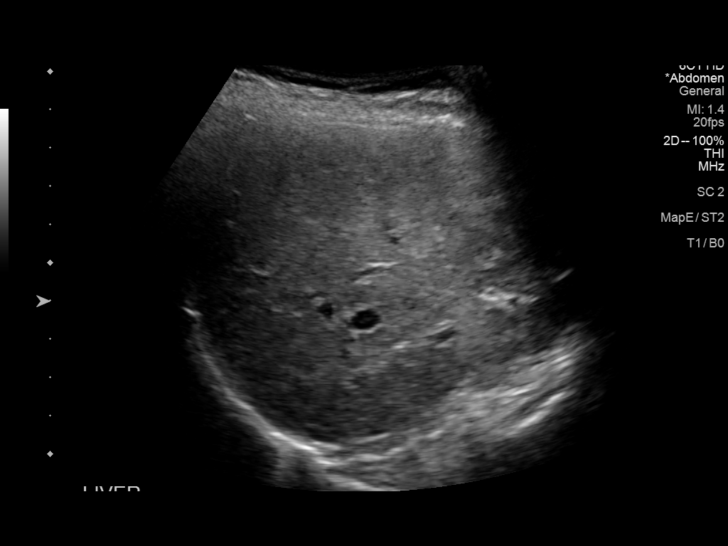
[im 34/41]
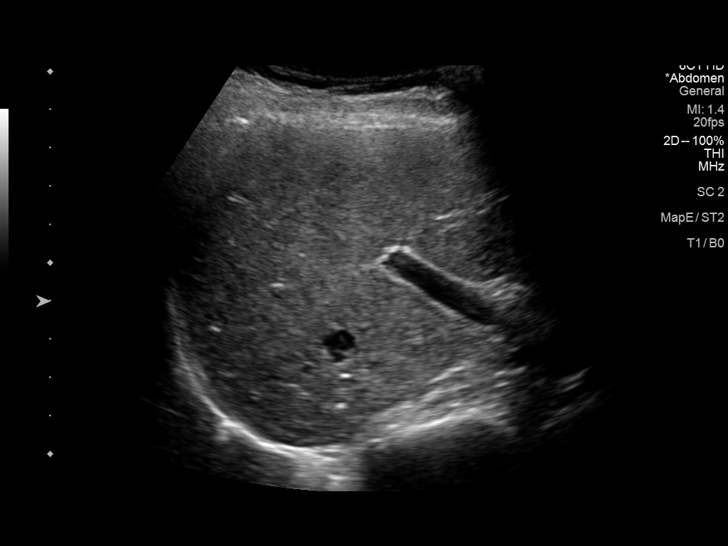
[im 37/41]
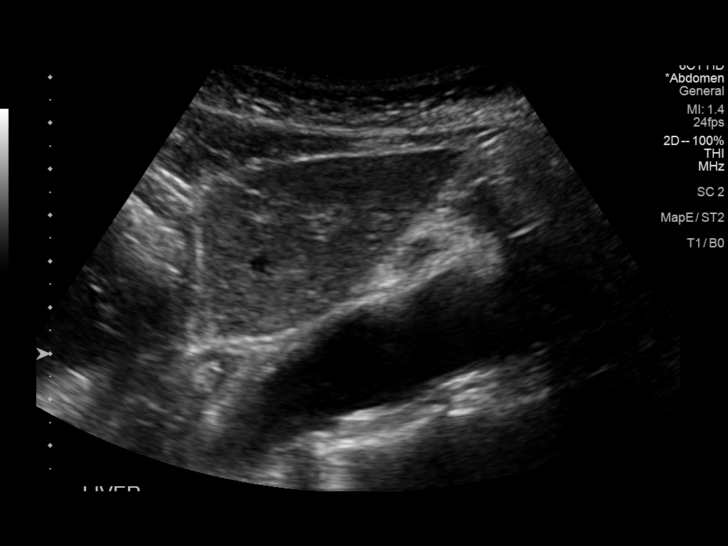
[im 41/41]
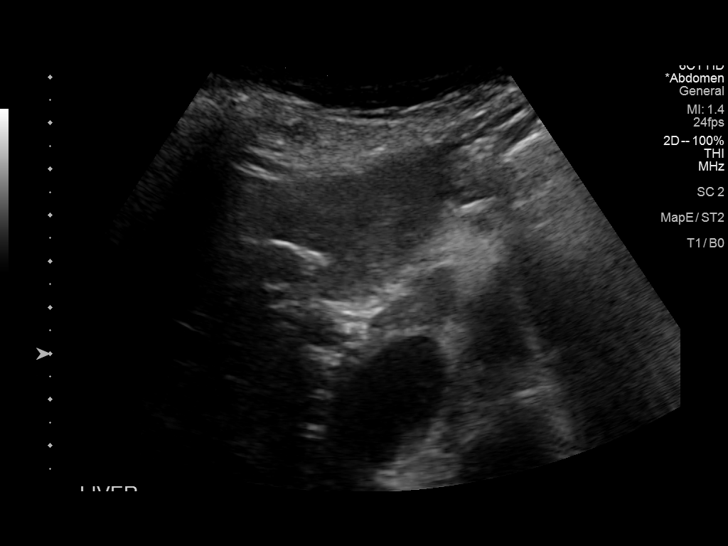

[14 of 25 positions shown; findings below may reference images not displayed]

FINDINGS: Gallbladder:

No gallstones or wall thickening visualized. No sonographic Murphy
sign noted by sonographer.

Common bile duct:

Diameter: 3.6 mm.

Liver:

No focal lesion identified. Within normal limits in parenchymal
echogenicity. Portal vein is patent on color Doppler imaging with
normal direction of blood flow towards the liver.

Other: None.
IMPRESSION: No acute abnormality in the right upper quadrant.

## 2022-03-06 DIAGNOSIS — E119 Type 2 diabetes mellitus without complications: Secondary | ICD-10-CM | POA: Diagnosis not present

## 2022-03-06 DIAGNOSIS — H25813 Combined forms of age-related cataract, bilateral: Secondary | ICD-10-CM | POA: Diagnosis not present

## 2022-03-06 DIAGNOSIS — H31002 Unspecified chorioretinal scars, left eye: Secondary | ICD-10-CM | POA: Diagnosis not present

## 2022-03-08 NOTE — Telephone Encounter (Signed)
Pt called to say [employer] is requesting Clinical Reports for her Libre 2 .  Please fax to: [phone]

## 2022-03-11 NOTE — Telephone Encounter (Signed)
Information faxed and confirmed.   ?

## 2022-03-12 ENCOUNTER — Telehealth: Payer: Self-pay | Admitting: Internal Medicine

## 2022-03-12 NOTE — Telephone Encounter (Signed)
Pt called in stating she is still unable to get Julie Duffy because the company is stating they need a code.

## 2022-03-13 NOTE — Telephone Encounter (Signed)
Error. Please disregard

## 2022-03-14 ENCOUNTER — Other Ambulatory Visit (INDEPENDENT_AMBULATORY_CARE_PROVIDER_SITE_OTHER): Payer: Medicare HMO

## 2022-03-14 DIAGNOSIS — E871 Hypo-osmolality and hyponatremia: Secondary | ICD-10-CM | POA: Diagnosis not present

## 2022-03-14 DIAGNOSIS — I10 Essential (primary) hypertension: Secondary | ICD-10-CM

## 2022-03-14 LAB — BASIC METABOLIC PANEL
BUN: 28 mg/dL — ABNORMAL HIGH (ref 6–23)
CO2: 27 mEq/L (ref 19–32)
Calcium: 9.4 mg/dL (ref 8.4–10.5)
Chloride: 103 mEq/L (ref 96–112)
Creatinine, Ser: 0.93 mg/dL (ref 0.40–1.20)
GFR: 59.48 mL/min — ABNORMAL LOW (ref >60.00)
Glucose, Bld: 96 mg/dL (ref 70–99)
Potassium: 4.3 mEq/L (ref 3.5–5.1)
Sodium: 138 mEq/L (ref 135–145)

## 2022-03-14 LAB — TSH: TSH: 0.5 u[IU]/mL (ref 0.35–5.50)

## 2022-03-20 DIAGNOSIS — E139 Other specified diabetes mellitus without complications: Secondary | ICD-10-CM | POA: Diagnosis not present

## 2022-03-27 DIAGNOSIS — E785 Hyperlipidemia, unspecified: Secondary | ICD-10-CM | POA: Diagnosis not present

## 2022-03-27 DIAGNOSIS — R197 Diarrhea, unspecified: Secondary | ICD-10-CM | POA: Diagnosis not present

## 2022-03-27 DIAGNOSIS — R591 Generalized enlarged lymph nodes: Secondary | ICD-10-CM | POA: Diagnosis not present

## 2022-03-27 DIAGNOSIS — E039 Hypothyroidism, unspecified: Secondary | ICD-10-CM | POA: Diagnosis not present

## 2022-03-27 DIAGNOSIS — R109 Unspecified abdominal pain: Secondary | ICD-10-CM | POA: Diagnosis not present

## 2022-03-27 DIAGNOSIS — E1165 Type 2 diabetes mellitus with hyperglycemia: Secondary | ICD-10-CM | POA: Diagnosis not present

## 2022-03-28 ENCOUNTER — Other Ambulatory Visit: Payer: Self-pay | Admitting: Endocrinology

## 2022-03-28 DIAGNOSIS — R591 Generalized enlarged lymph nodes: Secondary | ICD-10-CM

## 2022-03-29 DIAGNOSIS — E1165 Type 2 diabetes mellitus with hyperglycemia: Secondary | ICD-10-CM | POA: Diagnosis not present

## 2022-03-29 DIAGNOSIS — R591 Generalized enlarged lymph nodes: Secondary | ICD-10-CM | POA: Diagnosis not present

## 2022-04-04 ENCOUNTER — Ambulatory Visit
Admission: RE | Admit: 2022-04-04 | Discharge: 2022-04-04 | Disposition: A | Discharge status: Home / Self Care | Payer: Medicare HMO | Source: Ambulatory Visit | Attending: Endocrinology | Admitting: Endocrinology

## 2022-04-04 DIAGNOSIS — R591 Generalized enlarged lymph nodes: Secondary | ICD-10-CM

## 2022-04-04 DIAGNOSIS — E139 Other specified diabetes mellitus without complications: Secondary | ICD-10-CM | POA: Diagnosis not present

## 2022-04-04 DIAGNOSIS — E785 Hyperlipidemia, unspecified: Secondary | ICD-10-CM | POA: Diagnosis not present

## 2022-04-04 DIAGNOSIS — R590 Localized enlarged lymph nodes: Secondary | ICD-10-CM | POA: Diagnosis not present

## 2022-04-09 DIAGNOSIS — H25812 Combined forms of age-related cataract, left eye: Secondary | ICD-10-CM | POA: Diagnosis not present

## 2022-04-09 DIAGNOSIS — H25811 Combined forms of age-related cataract, right eye: Secondary | ICD-10-CM | POA: Diagnosis not present

## 2022-04-09 DIAGNOSIS — H25813 Combined forms of age-related cataract, bilateral: Secondary | ICD-10-CM | POA: Diagnosis not present

## 2022-04-18 ENCOUNTER — Ambulatory Visit: Payer: Medicare HMO | Admitting: Internal Medicine

## 2022-04-25 ENCOUNTER — Ambulatory Visit (INDEPENDENT_AMBULATORY_CARE_PROVIDER_SITE_OTHER): Payer: Medicare HMO | Admitting: Internal Medicine

## 2022-04-25 ENCOUNTER — Encounter: Payer: Self-pay | Admitting: Gastroenterology

## 2022-04-25 ENCOUNTER — Encounter: Payer: Self-pay | Admitting: Internal Medicine

## 2022-04-25 VITALS — BP 120/70 | HR 65 | Temp 97.5°F | Ht 64.0 in | Wt 104.3 lb

## 2022-04-25 DIAGNOSIS — Z794 Long term (current) use of insulin: Secondary | ICD-10-CM | POA: Diagnosis not present

## 2022-04-25 DIAGNOSIS — R197 Diarrhea, unspecified: Secondary | ICD-10-CM

## 2022-04-25 DIAGNOSIS — E1369 Other specified diabetes mellitus with other specified complication: Secondary | ICD-10-CM | POA: Diagnosis not present

## 2022-04-25 MED ORDER — SACCHAROMYCES BOULARDII 250 MG PO CAPS: 250.0000 mg | ORAL_CAPSULE | Freq: Two times a day (BID) | ORAL | 2 refills | Status: AC

## 2022-04-25 NOTE — Progress Notes (Signed)
Established Patient Office Visit     CC/Reason for Visit: Chronic diarrhea  HPI: Julie Duffy is a 76 y.o. female who is coming in today for the above mentioned reasons.  She was diagnosed with new onset diabetes back in June.  Her A1c was 15 at the time.  Her GAD antibodies were high suggesting this could be type I.  She is now under the care of endocrine.  Endocrine suggested she discontinue use of Tresiba and instead just use NovoLog sliding scale.  Review of her CGM today shows average fasting glucose in the 1 80-200 range.  She has not been using insulin at all as she feels like her sugar drops too quickly when she does.  Her last A1c was 7.7 in August.  She has been dealing with chronic diarrhea since her diabetes diagnosis.  Sometimes she uses Imodium which helps.  Her stools are soft and mushy and she can have 2-3 episodes a day.  She does not have fever or abdominal pain although she does have bloating and flatulence.   Past Medical/Surgical History: Past Medical History:  Diagnosis Date   Allergy    seasonal   Anxiety    Constipation    COVID-19 virus infection 09/2019   Diabetes mellitus without complication (HCC)    H. pylori infection    Hypertension    Hypothyroid    Scoliosis     Past Surgical History:  Procedure Laterality Date   APPENDECTOMY     NASAL SINUS SURGERY     NECK LESION BIOPSY     cysts benign   TUBAL LIGATION     UMBILICAL HERNIA REPAIR      Social History:  reports that she has never smoked. She has never used smokeless tobacco. She reports that she does not drink alcohol and does not use drugs.  Allergies: Allergies  Allergen Reactions   Guaifenesin & Derivatives Nausea And Vomiting   Clindamycin Other (See Comments)    GI intolerance   Clindamycin/Lincomycin Cross Reactors Other (See Comments)    GI intolerance   Codeine Other (See Comments)    hallucinations   Sulfa Antibiotics Other (See Comments)    unspecified     Family History:  Family History  Problem Relation Age of Onset   Cancer Mother 70       colon ca   Heart disease Mother    Colon cancer Mother    Thyroid disease Mother    Cancer Father    Thyroid disease Daughter    Esophageal cancer Neg Hx    Stomach cancer Neg Hx      Current Outpatient Medications:    ALPRAZolam (XANAX) 0.5 MG tablet, Take 1 tablet (0.5 mg total) by mouth 2 (two) times daily., Disp: 60 tablet, Rfl: 0   atorvastatin (LIPITOR) 20 MG tablet, TAKE 1 TABLET BY MOUTH EVERY DAY, Disp: 90 tablet, Rfl: 1   Blood Glucose Monitoring Suppl (TRUE METRIX AIR GLUCOSE METER) DEVI, 1 each by Does not apply route daily., Disp: 1 each, Rfl: 3   Continuous Blood Gluc Receiver (FREESTYLE LIBRE 2 READER) DEVI, Use once daily for glucose control, Disp: 1 each, Rfl: 1   Continuous Blood Gluc Sensor (FREESTYLE LIBRE 2 SENSOR) MISC, Apply one sensor every 14 days. Use to check blood sugar., Disp: 1 each, Rfl: 6   Continuous Blood Gluc Sensor (FREESTYLE LIBRE 3 SENSOR) MISC, Use one daily for glucose control. Dx E11.9, Disp: 2 each, Rfl: 2  glucose blood (RELION TRUE METRIX TEST STRIPS) test strip, 1 each by Other route daily., Disp: 100 each, Rfl: 12   insulin aspart (NOVOLOG FLEXPEN) 100 UNIT/ML FlexPen, Use NovoLog subcutaneous 3 times daily with meals as per sliding scale, Disp: 9 mL, Rfl: 3   insulin degludec (TRESIBA FLEXTOUCH) 200 UNIT/ML FlexTouch Pen, Inject 6 Units into the skin daily., Disp: 200 mL, Rfl: 0   Insulin Pen Needle 31G X 5 MM MISC, 3 Units by Does not apply route daily., Disp: 30 each, Rfl: 3   levothyroxine (SYNTHROID) 100 MCG tablet, Take 1 tablet Mon-Fri, Disp: 90 tablet, Rfl: 1   levothyroxine (SYNTHROID) 125 MCG tablet, Take 1 tablet Sat and Sun, Disp: 90 tablet, Rfl: 1   saccharomyces boulardii (FLORASTOR) 250 MG capsule, Take 1 capsule (250 mg total) by mouth 2 (two) times daily., Disp: 60 capsule, Rfl: 2   TRUEplus Lancets 33G MISC, 1 each by Does not  apply route daily., Disp: 100 each, Rfl: 3   metFORMIN (GLUCOPHAGE) 500 MG tablet, Take 1 tablet (500 mg total) by mouth 2 (two) times daily with a meal., Disp: 60 tablet, Rfl: 0  Review of Systems:  Constitutional: Denies fever, chills, diaphoresis, appetite change and fatigue.  HEENT: Denies photophobia, eye pain, redness, hearing loss, ear pain, congestion, sore throat, rhinorrhea, sneezing, mouth sores, trouble swallowing, neck pain, neck stiffness and tinnitus.   Respiratory: Denies SOB, DOE, cough, chest tightness,  and wheezing.   Cardiovascular: Denies chest pain, palpitations and leg swelling.  Gastrointestinal: Denies nausea, vomiting, abdominal pain,  constipation, blood in stool. Genitourinary: Denies dysuria, urgency, frequency, hematuria, flank pain and difficulty urinating.  Endocrine: Denies: hot or cold intolerance, sweats, changes in hair or nails, polyuria, polydipsia. Musculoskeletal: Denies myalgias, back pain, joint swelling, arthralgias and gait problem.  Skin: Denies pallor, rash and wound.  Neurological: Denies dizziness, seizures, syncope, weakness, light-headedness, numbness and headaches.  Hematological: Denies adenopathy. Easy bruising, personal or family bleeding history  Psychiatric/Behavioral: Denies suicidal ideation, mood changes, confusion, nervousness, sleep disturbance and agitation    Physical Exam: Vitals:   04/25/22 0922  BP: 120/70  Pulse: 65  Temp: (!) 97.5 F (36.4 C)  TempSrc: Oral  SpO2: 99%  Weight: 104 lb 5 oz (47.3 kg)  Height: 5\' 4"  (1.626 m)    Body mass index is 17.91 kg/m.   Constitutional: NAD, calm, comfortable Eyes: PERRL, lids and conjunctivae normal ENMT: Mucous membranes are moist.  Respiratory: clear to auscultation bilaterally, no wheezing, no crackles. Normal respiratory effort. No accessory muscle use.  Cardiovascular: Regular rate and rhythm, no murmurs / rubs / gallops. No extremity edema.  Abdomen: no  tenderness, no masses palpated. No hepatosplenomegaly. Bowel sounds positive.  Musculoskeletal: no clubbing / cyanosis. No joint deformity upper and lower extremities. Good ROM, no contractures. Normal muscle tone.  Psychiatric: Normal judgment and insight. Alert and oriented x 3. Normal mood.    Impression and Plan:  Diarrhea, unspecified type - Plan: saccharomyces boulardii (FLORASTOR) 250 MG capsule  Other specified diabetes mellitus with other specified complication, with long-term current use of insulin ([place])  -Unlikely to be infectious diarrhea as it has been present for a few months.  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 -Will start Florastor. -She will continue to work with the endocrinologist on diabetes management.  On review of her freestyle it would appear that she spends most days in the 180-200 glucose range.  Advised that  she use NovoLog in the scale she was provided.  Time spent:32 minutes reviewing chart, interviewing and examining patient and formulating plan of care.      Chaya Jan, MD [facility]

## 2022-05-02 DIAGNOSIS — H269 Unspecified cataract: Secondary | ICD-10-CM | POA: Diagnosis not present

## 2022-05-02 DIAGNOSIS — H25812 Combined forms of age-related cataract, left eye: Secondary | ICD-10-CM | POA: Diagnosis not present

## 2022-05-03 DIAGNOSIS — E785 Hyperlipidemia, unspecified: Secondary | ICD-10-CM | POA: Diagnosis not present

## 2022-05-03 DIAGNOSIS — E139 Other specified diabetes mellitus without complications: Secondary | ICD-10-CM | POA: Diagnosis not present

## 2022-05-03 DIAGNOSIS — E039 Hypothyroidism, unspecified: Secondary | ICD-10-CM | POA: Diagnosis not present

## 2022-05-23 DIAGNOSIS — R600 Localized edema: Secondary | ICD-10-CM | POA: Diagnosis not present

## 2022-05-23 DIAGNOSIS — M79661 Pain in right lower leg: Secondary | ICD-10-CM | POA: Diagnosis not present

## 2022-05-30 DIAGNOSIS — E139 Other specified diabetes mellitus without complications: Secondary | ICD-10-CM | POA: Diagnosis not present

## 2022-05-30 DIAGNOSIS — E785 Hyperlipidemia, unspecified: Secondary | ICD-10-CM | POA: Diagnosis not present

## 2022-06-06 DIAGNOSIS — I83891 Varicose veins of right lower extremities with other complications: Secondary | ICD-10-CM | POA: Diagnosis not present

## 2022-06-13 DIAGNOSIS — E139 Other specified diabetes mellitus without complications: Secondary | ICD-10-CM | POA: Diagnosis not present

## 2022-06-20 DIAGNOSIS — R600 Localized edema: Secondary | ICD-10-CM | POA: Diagnosis not present

## 2022-06-20 DIAGNOSIS — I83891 Varicose veins of right lower extremities with other complications: Secondary | ICD-10-CM | POA: Diagnosis not present

## 2022-07-01 DIAGNOSIS — E139 Other specified diabetes mellitus without complications: Secondary | ICD-10-CM | POA: Diagnosis not present

## 2022-07-01 DIAGNOSIS — E039 Hypothyroidism, unspecified: Secondary | ICD-10-CM | POA: Diagnosis not present

## 2022-07-01 DIAGNOSIS — E785 Hyperlipidemia, unspecified: Secondary | ICD-10-CM | POA: Diagnosis not present

## 2022-07-03 ENCOUNTER — Ambulatory Visit: Payer: Medicare HMO | Admitting: Internal Medicine

## 2022-07-11 ENCOUNTER — Other Ambulatory Visit: Payer: Self-pay | Admitting: Internal Medicine

## 2022-07-11 ENCOUNTER — Encounter: Payer: Self-pay | Admitting: Internal Medicine

## 2022-07-11 ENCOUNTER — Ambulatory Visit (INDEPENDENT_AMBULATORY_CARE_PROVIDER_SITE_OTHER): Payer: Medicare HMO | Admitting: Internal Medicine

## 2022-07-11 VITALS — BP 102/74 | Temp 97.5°F | Wt 104.8 lb

## 2022-07-11 DIAGNOSIS — Z1231 Encounter for screening mammogram for malignant neoplasm of breast: Secondary | ICD-10-CM | POA: Diagnosis not present

## 2022-07-11 DIAGNOSIS — Z1211 Encounter for screening for malignant neoplasm of colon: Secondary | ICD-10-CM | POA: Diagnosis not present

## 2022-07-11 DIAGNOSIS — E039 Hypothyroidism, unspecified: Secondary | ICD-10-CM

## 2022-07-11 DIAGNOSIS — R634 Abnormal weight loss: Secondary | ICD-10-CM

## 2022-07-11 DIAGNOSIS — Z124 Encounter for screening for malignant neoplasm of cervix: Secondary | ICD-10-CM

## 2022-07-11 DIAGNOSIS — E782 Mixed hyperlipidemia: Secondary | ICD-10-CM

## 2022-07-11 LAB — VITAMIN B12: Vitamin B-12: 294 pg/mL (ref 211–911)

## 2022-07-11 LAB — COMPREHENSIVE METABOLIC PANEL
ALT: 27 U/L (ref 0–35)
AST: 25 U/L (ref 0–37)
Albumin: 4.4 g/dL (ref 3.5–5.2)
Alkaline Phosphatase: 57 U/L (ref 39–117)
BUN: 28 mg/dL — ABNORMAL HIGH (ref 6–23)
CO2: 28 mEq/L (ref 19–32)
Calcium: 10 mg/dL (ref 8.4–10.5)
Chloride: 101 mEq/L (ref 96–112)
Creatinine, Ser: 0.89 mg/dL (ref 0.40–1.20)
GFR: 62.55 mL/min (ref >60.00)
Glucose, Bld: 130 mg/dL — ABNORMAL HIGH (ref 70–99)
Potassium: 4.2 mEq/L (ref 3.5–5.1)
Sodium: 138 mEq/L (ref 135–145)
Total Bilirubin: 0.8 mg/dL (ref 0.2–1.2)
Total Protein: 7.4 g/dL (ref 6.0–8.3)

## 2022-07-11 LAB — CBC WITH DIFFERENTIAL/PLATELET
Basophils Absolute: 0.1 10*3/uL (ref 0.0–0.1)
Basophils Relative: 0.9 % (ref 0.0–3.0)
Eosinophils Absolute: 0.9 10*3/uL — ABNORMAL HIGH (ref 0.0–0.7)
Eosinophils Relative: 12.7 % — ABNORMAL HIGH (ref 0.0–5.0)
HCT: 41.5 % (ref 36.0–46.0)
Hemoglobin: 13.8 g/dL (ref 12.0–15.0)
Lymphocytes Relative: 23.6 % (ref 12.0–46.0)
Lymphs Abs: 1.7 10*3/uL (ref 0.7–4.0)
MCHC: 33.3 g/dL (ref 30.0–36.0)
MCV: 90 fl (ref 78.0–100.0)
Monocytes Absolute: 0.4 10*3/uL (ref 0.1–1.0)
Monocytes Relative: 6.3 % (ref 3.0–12.0)
Neutro Abs: 4 10*3/uL (ref 1.4–7.7)
Neutrophils Relative %: 56.5 % (ref 43.0–77.0)
Platelets: 283 10*3/uL (ref 150.0–400.0)
RBC: 4.61 Mil/uL (ref 3.87–5.11)
RDW: 14.6 % (ref 11.5–15.5)
WBC: 7.1 10*3/uL (ref 4.0–10.5)

## 2022-07-11 LAB — LIPID PANEL
Cholesterol: 290 mg/dL — ABNORMAL HIGH (ref 0–200)
HDL: 91.5 mg/dL (ref >39.00)
LDL Cholesterol: 181 mg/dL — ABNORMAL HIGH (ref 0–99)
NonHDL: 198.65
Total CHOL/HDL Ratio: 3
Triglycerides: 89 mg/dL (ref 0.0–149.0)
VLDL: 17.8 mg/dL (ref 0.0–40.0)

## 2022-07-11 LAB — TSH: TSH: 3.29 u[IU]/mL (ref 0.35–5.50)

## 2022-07-11 LAB — VITAMIN D 25 HYDROXY (VIT D DEFICIENCY, FRACTURES): VITD: 50.33 ng/mL (ref 30.00–100.00)

## 2022-07-11 MED ORDER — ATORVASTATIN CALCIUM 80 MG PO TABS: 80.0000 mg | ORAL_TABLET | Freq: Every day | ORAL | 1 refills | Status: AC

## 2022-07-11 NOTE — Progress Notes (Signed)
Established Patient Office Visit     CC/Reason for Visit: Unintentional weight loss  HPI: Julie Duffy is a 76 y.o. female who is coming in today for the above mentioned reasons. Past Medical History is significant for: Newly diagnosed diabetes, hypothyroidism.  Her last A1c was finally well-controlled at 6.1.  She is followed by Dr. Suzette Battiest.  She continues to complain of unintentional weight loss.  We initially thought that this was probably related to newly diagnosed and uncontrolled diabetes but it continues to occur despite control of diabetes.  Her current weight is 104, her normal weight is 126.  She was 109 at her last visit in October.  She has no complaints of abdominal pain chest pain, shortness of breath.  She continues to complain of mushy stools that happens on almost a daily basis.  Her mother had colon cancer.  We have reviewed her food diary.  She eats mainly protein and vegetables with very little carbs, almost like a keto diet.  In fact all of her snacks and cereal are keto branded.   Past Medical/Surgical History: Past Medical History:  Diagnosis Date   Allergy    seasonal   Anxiety    Constipation    COVID-19 virus infection 09/2019   Diabetes mellitus without complication (HCC)    H. pylori infection    Hypertension    Hypothyroid    Scoliosis     Past Surgical History:  Procedure Laterality Date   APPENDECTOMY     NASAL SINUS SURGERY     NECK LESION BIOPSY     cysts benign   TUBAL LIGATION     UMBILICAL HERNIA REPAIR      Social History:  reports that she has never smoked. She has never used smokeless tobacco. She reports that she does not drink alcohol and does not use drugs.  Allergies: Allergies  Allergen Reactions   Guaifenesin & Derivatives Nausea And Vomiting   Clindamycin Other (See Comments)    GI intolerance   Clindamycin/Lincomycin Cross Reactors Other (See Comments)    GI intolerance   Codeine Other (See Comments)     hallucinations   Sulfa Antibiotics Other (See Comments)    unspecified    Family History:  Family History  Problem Relation Age of Onset   Cancer Mother 18       colon ca   Heart disease Mother    Colon cancer Mother    Thyroid disease Mother    Cancer Father    Thyroid disease Daughter    Esophageal cancer Neg Hx    Stomach cancer Neg Hx      Current Outpatient Medications:    ALPRAZolam (XANAX) 0.5 MG tablet, Take 1 tablet (0.5 mg total) by mouth 2 (two) times daily., Disp: 60 tablet, Rfl: 0   atorvastatin (LIPITOR) 20 MG tablet, TAKE 1 TABLET BY MOUTH EVERY DAY, Disp: 90 tablet, Rfl: 1   Blood Glucose Monitoring Suppl (TRUE METRIX AIR GLUCOSE METER) DEVI, 1 each by Does not apply route daily., Disp: 1 each, Rfl: 3   Continuous Blood Gluc Receiver (FREESTYLE LIBRE 2 READER) DEVI, Use once daily for glucose control, Disp: 1 each, Rfl: 1   Continuous Blood Gluc Sensor (FREESTYLE LIBRE 2 SENSOR) MISC, Apply one sensor every 14 days. Use to check blood sugar., Disp: 1 each, Rfl: 6   Continuous Blood Gluc Sensor (FREESTYLE LIBRE 3 SENSOR) MISC, Use one daily for glucose control. Dx E11.9, Disp: 2 each, Rfl: 2  glucose blood (RELION TRUE METRIX TEST STRIPS) test strip, 1 each by Other route daily., Disp: 100 each, Rfl: 12   insulin aspart (NOVOLOG FLEXPEN) 100 UNIT/ML FlexPen, Use NovoLog subcutaneous 3 times daily with meals as per sliding scale, Disp: 9 mL, Rfl: 3   Insulin Pen Needle 31G X 5 MM MISC, 3 Units by Does not apply route daily., Disp: 30 each, Rfl: 3   levothyroxine (SYNTHROID) 100 MCG tablet, Take 1 tablet Mon-Fri, Disp: 90 tablet, Rfl: 1   levothyroxine (SYNTHROID) 125 MCG tablet, Take 1 tablet Sat and Sun, Disp: 90 tablet, Rfl: 1   saccharomyces boulardii (FLORASTOR) 250 MG capsule, Take 1 capsule (250 mg total) by mouth 2 (two) times daily., Disp: 60 capsule, Rfl: 2   TRUEplus Lancets 33G MISC, 1 each by Does not apply route daily., Disp: 100 each, Rfl: 3   insulin  degludec (TRESIBA FLEXTOUCH) 200 UNIT/ML FlexTouch Pen, Inject 6 Units into the skin daily. (Patient not taking: Reported on 07/11/2022), Disp: 200 mL, Rfl: 0  Review of Systems:  Constitutional: Denies fever, chills, diaphoresis, appetite change and fatigue.  HEENT: Denies photophobia, eye pain, redness, hearing loss, ear pain, congestion, sore throat, rhinorrhea, sneezing, mouth sores, trouble swallowing, neck pain, neck stiffness and tinnitus.   Respiratory: Denies SOB, DOE, cough, chest tightness,  and wheezing.   Cardiovascular: Denies chest pain, palpitations and leg swelling.  Gastrointestinal: Denies nausea, vomiting, abdominal pain, diarrhea, constipation, blood in stool and abdominal distention.  Genitourinary: Denies dysuria, urgency, frequency, hematuria, flank pain and difficulty urinating.  Endocrine: Denies: hot or cold intolerance, sweats, changes in hair or nails, polyuria, polydipsia. Musculoskeletal: Denies myalgias, back pain, joint swelling, arthralgias and gait problem.  Skin: Denies pallor, rash and wound.  Neurological: Denies dizziness, seizures, syncope, weakness, light-headedness, numbness and headaches.  Hematological: Denies adenopathy. Easy bruising, personal or family bleeding history  Psychiatric/Behavioral: Denies suicidal ideation, mood changes, confusion, nervousness, sleep disturbance and agitation    Physical Exam: Vitals:   07/11/22 0900  BP: 102/74  Temp: (!) 97.5 F (36.4 C)  TempSrc: Oral  Weight: 104 lb 12.8 oz (47.5 kg)    Body mass index is 17.99 kg/m.   Constitutional: NAD, calm, comfortable Eyes: PERRL, lids and conjunctivae normal ENMT: Mucous membranes are moist.  Respiratory: clear to auscultation bilaterally, no wheezing, no crackles. Normal respiratory effort. No accessory muscle use.  Cardiovascular: Regular rate and rhythm, no murmurs / rubs / gallops. No extremity edema.   Psychiatric: Normal judgment and insight. Alert and  oriented x 3. Normal mood.    Impression and Plan:  Unintentional weight loss - Plan: CBC with Differential/Platelet, Comprehensive metabolic panel, Lipid panel, Vitamin B12, VITAMIN D 25 Hydroxy (Vit-D Deficiency, Fractures), VITAMIN D 25 Hydroxy (Vit-D Deficiency, Fractures), Vitamin B12, Lipid panel, Comprehensive metabolic panel, CBC with Differential/Platelet, Ambulatory referral to Gastroenterology  Acquired hypothyroidism - Plan: TSH, TSH  Screening for malignant neoplasm of colon - Plan: Ambulatory referral to Gastroenterology  Screening for malignant neoplasm of cervix - Plan: Ambulatory referral to Gynecology  Encounter for screening mammogram for malignant neoplasm of breast - Plan: MM Digital Screening  -For unintentional weight loss I will check labs today, will also breast cancer screening, colonoscopy and GYN referral for Pap smear.  She continues to have mushy stools, she has a strong family history of colon cancer, last colonoscopy was in October 2018 and she was advised 5-year follow-up. -If everything above is negative, I will consider a pan CT for evaluation. -We have  also discussed diet.  She is eating very little carbs and I think this is affecting her as well.  Time spent:33 minutes reviewing chart, interviewing and examining patient and formulating plan of care.      Chaya Jan, MD [facility] at [facility]

## 2022-07-15 ENCOUNTER — Other Ambulatory Visit: Payer: Self-pay | Admitting: *Deleted

## 2022-07-15 DIAGNOSIS — E782 Mixed hyperlipidemia: Secondary | ICD-10-CM

## 2022-07-22 DIAGNOSIS — I872 Venous insufficiency (chronic) (peripheral): Secondary | ICD-10-CM | POA: Diagnosis not present

## 2022-07-22 DIAGNOSIS — R600 Localized edema: Secondary | ICD-10-CM | POA: Diagnosis not present

## 2022-07-25 ENCOUNTER — Ambulatory Visit: Payer: Medicare HMO | Admitting: Podiatry

## 2022-07-25 ENCOUNTER — Ambulatory Visit (INDEPENDENT_AMBULATORY_CARE_PROVIDER_SITE_OTHER): Payer: Medicare HMO

## 2022-07-25 DIAGNOSIS — M79671 Pain in right foot: Secondary | ICD-10-CM

## 2022-07-25 DIAGNOSIS — M7742 Metatarsalgia, left foot: Secondary | ICD-10-CM

## 2022-07-25 DIAGNOSIS — M7741 Metatarsalgia, right foot: Secondary | ICD-10-CM

## 2022-07-25 DIAGNOSIS — M79672 Pain in left foot: Secondary | ICD-10-CM

## 2022-07-25 DIAGNOSIS — Q828 Other specified congenital malformations of skin: Secondary | ICD-10-CM | POA: Diagnosis not present

## 2022-07-25 NOTE — Progress Notes (Signed)
Subjective:  Patient ID: Julie Duffy, female    DOB: 1946/07/02,  MRN: [id number]  Chief Complaint  Patient presents with   Foot Pain    Pain located in bilateral feet at the ball of the feet, has high arch    76 y.o. female presents with concern for pain in the bilateral ball of foot left worse than right.  Has previous been seen for this issue in the past.  States her pain is primarily in the ball of the foot feels like she is walking hard surface.  She has tried many different types of cushioned shoes including wearing good running shoes without any relief.  She likes to do a lot of walking and wants to get back to this pain-free is much as she is able to.  Past Medical History:  Diagnosis Date   Allergy    seasonal   Anxiety    Constipation    COVID-19 virus infection 09/2019   Diabetes mellitus without complication (HCC)    H. pylori infection    Hypertension    Hypothyroid    Scoliosis     Allergies  Allergen Reactions   Guaifenesin & Derivatives Nausea And Vomiting   Clindamycin Other (See Comments)    GI intolerance   Clindamycin/Lincomycin Cross Reactors Other (See Comments)    GI intolerance   Codeine Other (See Comments)    hallucinations   Sulfa Antibiotics Other (See Comments)    unspecified    ROS: Negative except as per HPI above  Objective:  General: AAO x3, NAD  Dermatological: Multiple areas of hyperkeratotic tissue present to the plantar aspect of the forefoot bilateral foot no open lesions.  Vascular:  Dorsalis Pedis artery and Posterior Tibial artery pedal pulses are 2/4 bilateral.  Capillary fill time < 3 sec to all digits.   Neruologic: Grossly intact via light touch bilateral. Protective threshold intact to all sites bilateral.   Musculoskeletal: Pain with palpation along the second metatarsal head bilateral forefoot laterally.  Osseous prominence noted at the plantar aspect of the forefoot with fat pad atrophy present.  Gait:  Unassisted, Nonantalgic.   No images are attached to the encounter.  Radiographs:  Date: 07/25/2022 XR both feet Weightbearing AP/Lateral/Oblique   Findings: Attention directed to the forefoot there is noted to be no obvious fracture of the metatarsal heads.  Assessment:   1. Metatarsalgia of both feet   2. Porokeratosis   3. Foot pain, bilateral      Plan:  Patient was evaluated and treated and all questions answered.  # Metatarsalgia bilateral forefoot # Long second metatarsal left foot -Discussed with patient relief her primary concern is related to metatarsalgia pain and back pain atrophy underlying the metatarsal heads of both feet -I discussed conservative and surgical treatment options with the patient -Conservatively could consider custom-made orthotics offloading with metatarsal pad and shaving down of the callused areas -Surgically could consider injection of adipose matrix tissue underlying the second metatarsal head bilaterally would start with the left foot and is more painful. -Discussed the risk benefits and possible complications associated with such procedure.  This could be done in the office potentially. -Also discussed the possibility of a metatarsal shortening osteotomy such as a Weil osteotomy for the left foot -Patient would prefer just to start with the adipose tissue matrix injection if possible.  I discussed that I do not know how effective this will be for her and how long will last if it is effective.  Patient  understands this and wishes to proceed -We will begin surgical planning for office procedure for injection of Lineava adipose tissue matrix.   # Porokeratosis bilateral forefoot All symptomatic hyperkeratoses were safely debrided with a sterile #15 blade to patient's level of comfort without incident. We discussed preventative and palliative care of these lesions including supportive and accommodative shoegear, padding, prefabricated and custom molded  accommodative orthoses, use of a pumice stone and lotions/creams daily.   Return for for office procedure.          Everitt Amber, DPM [facility]

## 2022-08-01 ENCOUNTER — Telehealth: Payer: Self-pay

## 2022-08-01 NOTE — Telephone Encounter (Signed)
I spoke to [place] about the Leneva injections. Told her they are $800 per injection. She stated she would like to do more research on them and she will let me know what she decides.

## 2022-08-07 ENCOUNTER — Ambulatory Visit: Payer: Medicare HMO | Admitting: Internal Medicine

## 2022-08-21 ENCOUNTER — Other Ambulatory Visit: Payer: Self-pay | Admitting: Family Medicine

## 2022-09-02 ENCOUNTER — Other Ambulatory Visit: Payer: Self-pay | Admitting: Podiatry

## 2022-09-02 DIAGNOSIS — Q828 Other specified congenital malformations of skin: Secondary | ICD-10-CM

## 2022-09-02 DIAGNOSIS — M7741 Metatarsalgia, right foot: Secondary | ICD-10-CM

## 2022-09-02 DIAGNOSIS — M79671 Pain in right foot: Secondary | ICD-10-CM

## 2022-09-11 ENCOUNTER — Other Ambulatory Visit: Payer: Self-pay | Admitting: Internal Medicine

## 2022-09-11 DIAGNOSIS — E139 Other specified diabetes mellitus without complications: Secondary | ICD-10-CM | POA: Diagnosis not present

## 2022-09-11 DIAGNOSIS — R5383 Other fatigue: Secondary | ICD-10-CM

## 2022-09-12 ENCOUNTER — Ambulatory Visit
Admission: RE | Admit: 2022-09-12 | Discharge: 2022-09-12 | Disposition: A | Discharge status: Home / Self Care | Payer: Medicare HMO | Source: Ambulatory Visit | Attending: Internal Medicine | Admitting: Internal Medicine

## 2022-09-12 DIAGNOSIS — Z1231 Encounter for screening mammogram for malignant neoplasm of breast: Secondary | ICD-10-CM

## 2022-09-19 ENCOUNTER — Encounter: Payer: Self-pay | Admitting: Obstetrics and Gynecology

## 2022-09-19 ENCOUNTER — Ambulatory Visit (INDEPENDENT_AMBULATORY_CARE_PROVIDER_SITE_OTHER): Payer: Medicare HMO | Admitting: Obstetrics and Gynecology

## 2022-09-19 VITALS — BP 132/76 | HR 46 | Ht 63.0 in | Wt 105.0 lb

## 2022-09-19 DIAGNOSIS — Z01419 Encounter for gynecological examination (general) (routine) without abnormal findings: Secondary | ICD-10-CM | POA: Diagnosis not present

## 2022-09-19 NOTE — Progress Notes (Signed)
GYNECOLOGY ANNUAL PREVENTATIVE CARE ENCOUNTER NOTE  History:     Julie Duffy is a 77 y.o. [phone] female here for a routine annual gynecologic exam. Pt sent by her PCP with concern regarding cervical or ovarian cancer due to weight loss.   Current complaints: none.   Denies abnormal vaginal bleeding, discharge, pelvic pain, problems with intercourse or other gynecologic concerns.     The patient has had no postmenopausal bleeding.  Last pap 7 years ago was normal and there was no previous hx of abnormal pap smears.  Guidelines suggest no paps after age 86 without external concerns.   Gynecologic History No LMP recorded. Patient is postmenopausal. Contraception: post menopausal status Last Pap: 10/2015. Results were: normal with negative HPV Last mammogram: 09/12/22. Results were: normal  Obstetric History OB History  Gravida Para Term Preterm AB Living  3 3 3 $ 0 0 3  SAB IAB Ectopic Multiple Live Births          3    # Outcome Date GA Lbr Len/2nd Weight Sex Delivery Anes PTL Lv  3 Term 03/11/73    M Vag-Spont   LIV  2 Term 11/07/70    M Vag-Spont   LIV  1 Term 07/27/63    F Vag-Spont   LIV    Past Medical History:  Diagnosis Date   Allergy    seasonal   Anxiety    Constipation    COVID-19 virus infection 09/2019   Diabetes mellitus without complication (HCC)    H. pylori infection    Hypertension    Hypothyroid    Scoliosis     Past Surgical History:  Procedure Laterality Date   APPENDECTOMY     NASAL SINUS SURGERY     NECK LESION BIOPSY     cysts benign   TUBAL LIGATION     UMBILICAL HERNIA REPAIR      Current Outpatient Medications on File Prior to Visit  Medication Sig Dispense Refill   ALPRAZolam (XANAX) 0.5 MG tablet TAKE 1 TABLET BY MOUTH 2 TIMES DAILY. 60 tablet 1   atorvastatin (LIPITOR) 80 MG tablet Take 1 tablet (80 mg total) by mouth daily. 90 tablet 1   Blood Glucose Monitoring Suppl (TRUE METRIX AIR GLUCOSE METER) DEVI 1 each by Does not apply  route daily. 1 each 3   Continuous Blood Gluc Receiver (FREESTYLE LIBRE 2 READER) DEVI Use once daily for glucose control 1 each 1   Continuous Blood Gluc Sensor (FREESTYLE LIBRE 2 SENSOR) MISC Apply one sensor every 14 days. Use to check blood sugar. 1 each 6   Continuous Blood Gluc Sensor (FREESTYLE LIBRE 3 SENSOR) MISC Use one daily for glucose control. Dx E11.9 2 each 2   glucose blood (RELION TRUE METRIX TEST STRIPS) test strip 1 each by Other route daily. 100 each 12   insulin aspart (NOVOLOG FLEXPEN) 100 UNIT/ML FlexPen USE NOVOLOG SUBCUTANEOUS 3 TIMES DAILY WITH MEALS AS PER SLIDING SCALE 15 mL 0   insulin degludec (TRESIBA FLEXTOUCH) 200 UNIT/ML FlexTouch Pen Inject 6 Units into the skin daily. 200 mL 0   Insulin Pen Needle 31G X 5 MM MISC 3 Units by Does not apply route daily. 30 each 3   levothyroxine (SYNTHROID) 100 MCG tablet Take 1 tablet Mon-Fri 90 tablet 1   levothyroxine (SYNTHROID) 125 MCG tablet Take 1 tablet Sat and Sun 90 tablet 1   saccharomyces boulardii (FLORASTOR) 250 MG capsule Take 1 capsule (250 mg total) by mouth  2 (two) times daily. 60 capsule 2   TRUEplus Lancets 33G MISC 1 each by Does not apply route daily. 100 each 3   No current facility-administered medications on file prior to visit.    Allergies  Allergen Reactions   Guaifenesin & Derivatives Nausea And Vomiting   Clindamycin Other (See Comments)    GI intolerance   Clindamycin/Lincomycin Cross Reactors Other (See Comments)    GI intolerance   Codeine Other (See Comments)    hallucinations   Sulfa Antibiotics Other (See Comments)    unspecified    Social History:  reports that she has never smoked. She has never used smokeless tobacco. She reports that she does not drink alcohol and does not use drugs.  Family History  Problem Relation Age of Onset   Cancer Mother 3       colon ca   Heart disease Mother    Colon cancer Mother    Thyroid disease Mother    Cancer Father    Thyroid disease  Daughter    Esophageal cancer Neg Hx    Stomach cancer Neg Hx     The following portions of the patient's history were reviewed and updated as appropriate: allergies, current medications, past family history, past medical history, past social history, past surgical history and problem list.  Review of Systems Pertinent items noted in HPI and remainder of comprehensive ROS otherwise negative.  Physical Exam:  BP 132/76   Pulse (!) 46   Ht 5' 3"$  (1.6 m)   Wt 105 lb (47.6 kg)   BMI 18.60 kg/m  CONSTITUTIONAL: Well-developed, well-nourished but slim female in no acute distress.  HENT:  Normocephalic, atraumatic, External right and left ear normal. Oropharynx is clear and moist EYES: Conjunctivae and EOM are normal.  NECK: Normal range of motion, supple, no masses.  Normal thyroid.  SKIN: Skin is warm and dry. No rash noted. Not diaphoretic. No erythema. No pallor. MUSCULOSKELETAL: Normal range of motion. No tenderness.  No cyanosis, clubbing, or edema.  2+ distal pulses. NEUROLOGIC: Alert and oriented to person, place, and time. Normal reflexes, muscle tone coordination.  PSYCHIATRIC: Normal mood and affect. Normal behavior. Normal judgment and thought content. CARDIOVASCULAR: Normal heart rate noted, regular rhythm RESPIRATORY: Clear to auscultation bilaterally. Effort and breath sounds normal, no problems with respiration noted. BREASTS: deferred ABDOMEN: Soft, no distention noted.  No tenderness, rebound or guarding. No palpable masses noted.  No fluid wave noted. PELVIC: Normal appearing external genitalia and urethral caruncle noted; normal appearing vaginal mucosa and cervix.  No abnormal discharge noted.  Normal uterine size, no other palpable masses, no uterine or adnexal tenderness.  Uterus was small. No adnexal masses noted.   Performed in the presence of a chaperone.   Assessment and Plan:    1. Women's annual routine gynecological examination Normal annual exam No outward  appearance or concern for gyn malignancy. If there is further concern for malignancy can still order a CT or MRI Routine preventative health maintenance measures emphasized. Please refer to After Visit Summary for other counseling recommendations.      Lynnda Shields, MD, [place] for [facility], [place]

## 2022-09-19 NOTE — Progress Notes (Signed)
New GYN referral presents for AEX.  C/o unexplained weight loss, stomach pain, cramps and her PCP is concerned that she might have cervical/ovarian cancer.

## 2022-10-31 DIAGNOSIS — I10 Essential (primary) hypertension: Secondary | ICD-10-CM | POA: Diagnosis not present

## 2022-10-31 DIAGNOSIS — E109 Type 1 diabetes mellitus without complications: Secondary | ICD-10-CM | POA: Diagnosis not present

## 2022-10-31 DIAGNOSIS — E871 Hypo-osmolality and hyponatremia: Secondary | ICD-10-CM | POA: Diagnosis not present

## 2022-10-31 DIAGNOSIS — E139 Other specified diabetes mellitus without complications: Secondary | ICD-10-CM | POA: Diagnosis not present

## 2022-10-31 DIAGNOSIS — I700 Atherosclerosis of aorta: Secondary | ICD-10-CM | POA: Diagnosis not present

## 2022-10-31 DIAGNOSIS — E559 Vitamin D deficiency, unspecified: Secondary | ICD-10-CM | POA: Diagnosis not present

## 2022-10-31 DIAGNOSIS — E1069 Type 1 diabetes mellitus with other specified complication: Secondary | ICD-10-CM | POA: Diagnosis not present

## 2022-10-31 DIAGNOSIS — E785 Hyperlipidemia, unspecified: Secondary | ICD-10-CM | POA: Diagnosis not present

## 2022-10-31 DIAGNOSIS — E039 Hypothyroidism, unspecified: Secondary | ICD-10-CM | POA: Diagnosis not present

## 2022-11-07 ENCOUNTER — Ambulatory Visit: Payer: Medicare HMO | Admitting: Physician Assistant

## 2022-11-26 NOTE — Progress Notes (Unsigned)
11/28/2022 Julie Duffy [id number] 09/09/1945  Referring provider: Philip Aspen, [place]* Primary GI doctor: Dr. Lavon Paganini (Dr. Christella Hartigan)  ASSESSMENT AND PLAN:   77 year old female with family history of colon cancer in her mother at age 53, last colonoscopy 2018 Dr. Christella Hartigan unremarkable recall 5 years, EGD 2019 H. pylori gastritis eradication study 10/19/2019 negative presents with weight loss, postprandial abdominal pain, nausea and loose stools Was initially diagnosed with type 1 diabetes with weight loss and A1c of 15 in 2021, normal CT ABP at the time. She has had continuing weight loss and now associated postprandial abdominal pain, nausea and loose stools She controls her diabetes with just diet, but battles hypoglycemia.  -With patient's weight loss and symptoms and newly diagnosed type 1 diabetes -will repeat CT abdomen pelvis to evaluate for pancreatic issues, malignancy especially with slight prominence appreciated on physical exam. -Will plan for repeat EGD and colonoscopy with Dr. Lavon Paganini at the [facility] to evaluate for gastritis, peptic ulcer disease, celiac, microscopic colitis, and occult malignancy. -Continue Imodium as needed -If this is negative high suspicion for potentially gastroparesis versus EPI -Will get pancreatic elastase and fecal fat on the patient, discussed trial of Creon but she prefers to get tested first. -Given diet for gastroparesis, will plan on GES if the above mentioned test are negative  I recommend upper gastrointestinal and colorectal evaluation with an EGD and colonoscopy. Risk of bowel prep, conscious sedation, and EGD and colonoscopy were discussed.  Risks include but are not limited to dehydration, pain, bleeding, cardiopulmonary process, bowel perforation, or other possible adverse outcomes..  Treatment plan was discussed with patient, and agreed upon.    Patient Care Team: Philip Aspen, Limmie Patricia, MD as PCP - General (Internal  Medicine) Philip Aspen, Limmie Patricia, MD as Consulting Physician (Internal Medicine)  HISTORY OF PRESENT ILLNESS: 77 y.o. female with a past medical history of hypertension, hypothyroidism, type 2 diabetes, constipation, H. pylori gastritis treated 2019 and others listed below presents for evaluation of weight loss.   06/04/2017 colonoscopy with Dr. Christella Hartigan good prep high risk due to family history of colon cancer, small internal hemorrhoids otherwise unremarkable, recall 5 years ( 05/2022) 12/30/2017 EGD for dyspepsia/GERD with Dr. Christella Hartigan moderate inflammation entire stomach otherwise unremarkable, H. pylori gastric 14-day course Pylera 06/22/2018 H. pylori stool negative, repeat 10/19/2019 H. pylori stool negative 01/26/2020 CT abdomen pelvis with contrast for abdominal pain, nausea and weight loss showed normal liver, no gallstones, normal pancreas normal spleen unremarkable stomach no evidence of bowel wall thickening or inflammatory changes, no lymphadenopathy, aortic atherosclerosis.  Patient reports family history of colon cancer in her mother in 70s . 07/11/2022 labs reviewed show no anemia, no leukocytosis, normal platelets.  BUN 28, creatinine 0.89.  Baseline BUN 18-20, creatinine 0.9, normal liver.   B12 294, thyroid 3.01 February 2020 she was diagnosed with type 1 DM and had weight loss from that 26 lbs. She had normal CT at the time.  She has not been able to gain the weight back.  She put herself on low carb diet, mainly meats/veggies.  She controls her sugars with diet, battles with low sugars especially at night.  She will have post prandial AB pain about an hour after eating, will feel like she was "kicked in the gut". No radiation, stays around stomach.  She has nausea but no associated vomiting.  Denies GERD, trouble swallowing. Her appetite is okay but she can not have "what she wants" due to her DM.  Before going to work she will have loose Bm's. Will take 2 imodium AD and this  helps.  Denies mucus or hematochezia.  She has been taking muscle mild/ensure plus but this causes nausea so she does not do it often. She seems to have a lot of mild products with cheese, milk, yogurt.   Normal mammogram 09/12/2022. She is following up with Dr. Talmage Nap for her diabetes. States she has had hypoglycemia.  Wt Readings from Last 10 Encounters:  11/28/22 104 lb 2 oz (47.2 kg)  09/19/22 105 lb (47.6 kg)  07/11/22 104 lb 12.8 oz (47.5 kg)  04/25/22 104 lb 5 oz (47.3 kg)  02/12/22 109 lb 9.6 oz (49.7 kg)  01/31/22 109 lb 3.2 oz (49.5 kg)  01/25/22 114 lb 9.6 oz (52 kg)  01/22/22 110 lb (49.9 kg)  01/21/22 109 lb (49.4 kg)  01/18/22 109 lb 12.8 oz (49.8 kg)   She denies blood thinner use.  She denies NSAID use.  She denies ETOH use.   She denies tobacco use.  She denies drug use.    She  reports that she has never smoked. She has never used smokeless tobacco. She reports that she does not drink alcohol and does not use drugs.  RELEVANT LABS AND IMAGING: CBC    Component Value Date/Time   WBC 7.1 07/11/2022 0929   RBC 4.61 07/11/2022 0929   HGB 13.8 07/11/2022 0929   HCT 41.5 07/11/2022 0929   PLT 283.0 07/11/2022 0929   MCV 90.0 07/11/2022 0929   MCH 29.9 01/16/2022 1619   MCHC 33.3 07/11/2022 0929   RDW 14.6 07/11/2022 0929   LYMPHSABS 1.7 07/11/2022 0929   MONOABS 0.4 07/11/2022 0929   EOSABS 0.9 (H) 07/11/2022 0929   BASOSABS 0.1 07/11/2022 0929   Recent Labs    12/27/21 1001 01/16/22 1253 01/16/22 1619 07/11/22 0929  HGB 13.7 13.8 13.9 13.8    CMP     Component Value Date/Time   NA 138 07/11/2022 0929   K 4.2 07/11/2022 0929   CL 101 07/11/2022 0929   CO2 28 07/11/2022 0929   GLUCOSE 130 (H) 07/11/2022 0929   BUN 28 (H) 07/11/2022 0929   CREATININE 0.89 07/11/2022 0929   CALCIUM 10.0 07/11/2022 0929   PROT 7.4 07/11/2022 0929   ALBUMIN 4.4 07/11/2022 0929   AST 25 07/11/2022 0929   ALT 27 07/11/2022 0929   ALKPHOS 57 07/11/2022 0929    BILITOT 0.8 07/11/2022 0929   GFRNONAA >60 01/16/2022 1619   GFRAA >60 01/20/2020 1240      Latest Ref Rng & Units 07/11/2022    9:29 AM 01/16/2022    4:19 PM 01/16/2022   12:53 PM  Hepatic Function  Total Protein 6.0 - 8.3 g/dL 7.4  6.9  7.4   Albumin 3.5 - 5.2 g/dL 4.4  3.8  4.2   AST 0 - 37 U/L ALT 0 - 35 U/L Alk Phosphatase 39 - 117 U/L 57  103  108   Total Bilirubin 0.2 - 1.2 mg/dL 0.8  1.3  0.9      Lab Results  Component Value Date   HGBA1C 15.0 (A) 01/16/2022  She states recently had A1C checked and it was 6.1.    Current Medications:   Current Outpatient Medications (Endocrine & Metabolic):    insulin aspart (NOVOLOG FLEXPEN) 100 UNIT/ML FlexPen, USE NOVOLOG SUBCUTANEOUS 3 TIMES DAILY WITH MEALS AS  PER SLIDING SCALE   levothyroxine (SYNTHROID) 100 MCG tablet, Take 1 tablet Mon-Fri   levothyroxine (SYNTHROID) 125 MCG tablet, Take 1 tablet Sat and Sun   insulin degludec (TRESIBA FLEXTOUCH) 200 UNIT/ML FlexTouch Pen, Inject 6 Units into the skin daily. (Patient not taking: Reported on 11/28/2022)  Current Outpatient Medications (Cardiovascular):    atorvastatin (LIPITOR) 80 MG tablet, Take 1 tablet (80 mg total) by mouth daily. (Patient not taking: Reported on 11/28/2022)    Current Outpatient Medications (Hematological):    Cyanocobalamin (VITAMIN B-12 PO), Take 1 tablet by mouth daily.  Current Outpatient Medications (Other):    ALPRAZolam (XANAX) 0.5 MG tablet, TAKE 1 TABLET BY MOUTH 2 TIMES DAILY.   Blood Glucose Monitoring Suppl (TRUE METRIX AIR GLUCOSE METER) DEVI, 1 each by Does not apply route daily.   Continuous Blood Gluc Receiver (FREESTYLE LIBRE 2 READER) DEVI, Use once daily for glucose control   Continuous Blood Gluc Sensor (FREESTYLE LIBRE 2 SENSOR) MISC, Apply one sensor every 14 days. Use to check blood sugar.   Continuous Blood Gluc Sensor (FREESTYLE LIBRE 3 SENSOR) MISC, Use one daily for glucose control. Dx E11.9   glucose  blood (RELION TRUE METRIX TEST STRIPS) test strip, 1 each by Other route daily.   Insulin Pen Needle 31G X 5 MM MISC, 3 Units by Does not apply route daily.   loperamide (IMODIUM A-D) 2 MG tablet, Take 4 mg by mouth as needed for diarrhea or loose stools.   Na Sulfate-K Sulfate-Mg Sulf 17.5-3.13-1.6 GM/177ML SOLN, Take 1 kit by mouth once for 1 dose.   TRUEplus Lancets 33G MISC, 1 each by Does not apply route daily.   saccharomyces boulardii (FLORASTOR) 250 MG capsule, Take 1 capsule (250 mg total) by mouth 2 (two) times daily. (Patient not taking: Reported on 11/28/2022)  Medical History:  Past Medical History:  Diagnosis Date   Allergy    seasonal   Anxiety    Constipation    COVID-19 virus infection 09/2019   Diabetes mellitus without complication    H. pylori infection    Hypertension    Hypothyroid    Scoliosis    Allergies:  Allergies  Allergen Reactions   Guaifenesin & Derivatives Nausea And Vomiting   Clindamycin Other (See Comments)    GI intolerance   Clindamycin/Lincomycin Cross Reactors Other (See Comments)    GI intolerance   Codeine Other (See Comments)    hallucinations   Sulfa Antibiotics Other (See Comments)    unspecified     Surgical History:  She  has a past surgical history that includes Nasal sinus surgery; Neck lesion biopsy; Umbilical hernia repair; Appendectomy; and Tubal ligation. Family History:  Her family history includes Cancer in her father; Cancer (age of onset: 69) in her mother; Colon cancer in her mother; Heart disease in her mother; Thyroid disease in her daughter and mother.  REVIEW OF SYSTEMS  : All other systems reviewed and negative except where noted in the History of Present Illness.  PHYSICAL EXAM: BP (!) 144/74 (BP Location: Left Arm, Patient Position: Sitting, Cuff Size: Normal)   Pulse (!) 56   Ht 5\' 3"  (1.6 m)   Wt 104 lb 2 oz (47.2 kg)   BMI 18.44 kg/m  General Appearance: Thin appearing in no apparent distress. Head:    Normocephalic and atraumatic. Eyes:  sclerae anicteric,conjunctive pink  Respiratory: Respiratory effort normal, BS equal bilaterally without rales, rhonchi, wheezing. Cardio: RRR with no MRGs. Peripheral pulses intact.  Abdomen: Soft,  Flat ,active bowel sounds. mild tenderness in the epigastrium with slight prominence/fullness appreciated. Without guarding and Without rebound. No masses. Rectal: Not evaluated Musculoskeletal: Full ROM, Normal gait. Without edema. Skin:  Dry and intact without significant lesions or rashes Neuro: Alert and  oriented x4;  No focal deficits. Psych:  Cooperative. Normal mood and affect.    Doree Albee, PA-C 9:24 AM

## 2022-11-28 ENCOUNTER — Encounter: Payer: Self-pay | Admitting: Physician Assistant

## 2022-11-28 ENCOUNTER — Ambulatory Visit: Payer: Medicare HMO | Admitting: Physician Assistant

## 2022-11-28 ENCOUNTER — Other Ambulatory Visit: Payer: Self-pay

## 2022-11-28 ENCOUNTER — Other Ambulatory Visit (INDEPENDENT_AMBULATORY_CARE_PROVIDER_SITE_OTHER): Payer: Medicare HMO

## 2022-11-28 VITALS — BP 144/74 | HR 56 | Ht 63.0 in | Wt 104.1 lb

## 2022-11-28 DIAGNOSIS — R110 Nausea: Secondary | ICD-10-CM

## 2022-11-28 DIAGNOSIS — R195 Other fecal abnormalities: Secondary | ICD-10-CM

## 2022-11-28 DIAGNOSIS — R1013 Epigastric pain: Secondary | ICD-10-CM

## 2022-11-28 DIAGNOSIS — R634 Abnormal weight loss: Secondary | ICD-10-CM

## 2022-11-28 DIAGNOSIS — E109 Type 1 diabetes mellitus without complications: Secondary | ICD-10-CM

## 2022-11-28 LAB — CBC WITH DIFFERENTIAL/PLATELET
Basophils Absolute: 0.1 10*3/uL (ref 0.0–0.1)
Basophils Relative: 1.3 % (ref 0.0–3.0)
Eosinophils Absolute: 0.6 10*3/uL (ref 0.0–0.7)
Eosinophils Relative: 7.9 % — ABNORMAL HIGH (ref 0.0–5.0)
HCT: 41.5 % (ref 36.0–46.0)
Hemoglobin: 13.8 g/dL (ref 12.0–15.0)
Lymphocytes Relative: 17.6 % (ref 12.0–46.0)
Lymphs Abs: 1.3 10*3/uL (ref 0.7–4.0)
MCHC: 33.3 g/dL (ref 30.0–36.0)
MCV: 90.2 fl (ref 78.0–100.0)
Monocytes Absolute: 0.4 10*3/uL (ref 0.1–1.0)
Monocytes Relative: 5.5 % (ref 3.0–12.0)
Neutro Abs: 5 10*3/uL (ref 1.4–7.7)
Neutrophils Relative %: 67.7 % (ref 43.0–77.0)
Platelets: 303 10*3/uL (ref 150.0–400.0)
RBC: 4.6 Mil/uL (ref 3.87–5.11)
RDW: 14.1 % (ref 11.5–15.5)
WBC: 7.5 10*3/uL (ref 4.0–10.5)

## 2022-11-28 LAB — COMPREHENSIVE METABOLIC PANEL
ALT: 57 U/L — ABNORMAL HIGH (ref 0–35)
AST: 34 U/L (ref 0–37)
Albumin: 4.5 g/dL (ref 3.5–5.2)
Alkaline Phosphatase: 54 U/L (ref 39–117)
BUN: 30 mg/dL — ABNORMAL HIGH (ref 6–23)
CO2: 27 mEq/L (ref 19–32)
Calcium: 9.8 mg/dL (ref 8.4–10.5)
Chloride: 103 mEq/L (ref 96–112)
Creatinine, Ser: 1.23 mg/dL — ABNORMAL HIGH (ref 0.40–1.20)
GFR: 42.31 mL/min — ABNORMAL LOW (ref >60.00)
Glucose, Bld: 139 mg/dL — ABNORMAL HIGH (ref 70–99)
Potassium: 4.2 mEq/L (ref 3.5–5.1)
Sodium: 139 mEq/L (ref 135–145)
Total Bilirubin: 0.7 mg/dL (ref 0.2–1.2)
Total Protein: 7.4 g/dL (ref 6.0–8.3)

## 2022-11-28 LAB — SEDIMENTATION RATE: Sed Rate: 43 mm/hr — ABNORMAL HIGH (ref 0–30)

## 2022-11-28 MED ORDER — NA SULFATE-K SULFATE-MG SULF 17.5-3.13-1.6 GM/177ML PO SOLN: 1.0000 | Freq: Once | ORAL | 0 refills | Status: AC

## 2022-11-28 NOTE — Patient Instructions (Addendum)
_______________________________________________________  If your blood pressure at your visit was 140/90 or greater, please contact your primary care physician to follow up on this.  _______________________________________________________  If you are age 78 or older, your body mass index should be between 23-30. Your Body mass index is 18.44 kg/m. If this is out of the aforementioned range listed, please consider follow up with your Primary Care Provider.  If you are age 62 or younger, your body mass index should be between 19-25. Your Body mass index is 18.44 kg/m. If this is out of the aformentioned range listed, please consider follow up with your Primary Care Provider.   ________________________________________________________  The [facility] providers would like to encourage you to use [facility] to communicate with providers for non-urgent requests or questions.  Due to long hold times on the telephone, sending your provider a message by [facility] may be a faster and more efficient way to get a response.  Please allow 48 business hours for a response.  Please remember that this is for non-urgent requests.  _______________________________________________________   Your provider has requested that you go to the basement level for lab work before leaving today. Press "B" on the elevator. The lab is located at the first door on the left as you exit the elevator.   You can add on protein and things high calorie for weight gain - Can add ensure/boost to ice cream for a shake - can add protein powder to oatmeal after cooked or to a fruit smooth - avocado has high calorie, good to add to proteins - nuts and peanut butter are good to eat or add to yogurt/smoothies - [place] yogurt is good  We have sent the following medications to your pharmacy for you to pick up at your convenience: Suprep  You have been scheduled for an endoscopy and colonoscopy. Please follow the written instructions given  to you at your visit today. Please pick up your prep supplies at the pharmacy within the next 1-3 days. If you use inhalers (even only as needed), please bring them with you on the day of your procedure.  You have been scheduled for a CT scan of the abdomen and pelvis at [facility] ([address] [place], [place], [place] [address]).   You are scheduled on Friday Dec 13, 2022  at 5pm. You should arrive by 245pm  for registration. Please follow the written instructions below on the day of your exam:  WARNING: IF YOU ARE ALLERGIC TO IODINE/X-RAY DYE, PLEASE NOTIFY RADIOLOGY IMMEDIATELY AT [phone]! YOU WILL BE GIVEN A 13 HOUR PREMEDICATION PREP.  1) Do not eat or drink anything after 1pm (4 hours prior to your test) 2) You will be given 2 bottles of oral contrast to drink on site. The solution may taste better if refrigerated, but do NOT add ice or any other liquid to this solution. Shake well before drinking.    Drink 1 bottle of contrast @ 3pm (2 hours prior to your exam)  Drink 1 bottle of contrast @ 4pm (1 hour prior to your exam)  You may take any medications as prescribed with a small amount of water, if necessary. If you take any of the following medications: METFORMIN, GLUCOPHAGE, GLUCOVANCE, AVANDAMET, RIOMET, FORTAMET, ACTOPLUS MET, JANUMET, GLUMETZA or METAGLIP, you MAY be asked to HOLD this medication 48 hours AFTER the exam.  The purpose of you drinking the oral contrast is to aid in the visualization of your intestinal tract. The contrast solution may cause  some diarrhea. Depending on your individual set of symptoms, you may also receive an intravenous injection of x-ray contrast/dye. Plan on being at [facility] for 30 minutes or longer, depending on the type of exam you are having performed.  This test typically takes 30-45 minutes to complete.  If you have any questions regarding your exam or if you need to reschedule, you may call the CT department at [phone]  between the hours of 8:00 am and 5:00 pm, Monday-Friday.  ________________________________________________________________________  Exocrine Pancreatic Insufficiency (EPI) EPI is a condition in which your body doesn't provide enough pancreatic enzymes to properly digest your food (which can sometimes lead to some unpleasant digestive symptoms such as bloating, AB pain and loose stools, weight loss).   For many people, EPI is also a chronic lifelong condition.   That's why its so important to know what to expect with your EPI treatment, because with the right plan in place, EPI is manageable.  HOW DO I TAKE PANCREATIC ENZYMES?  Pancreatic Enzyme Replacement therapy (Creon) is only available through prescription and cannot be substituted with over the counter alternatives.   Your doctor will personalize your dose based on your weight, diet, and symptoms.  The number of capsules you take per meal will depend on your prescribed dose.  Creon must be taken DURING every meal and snack.   Whether its a full meal or a snack, take Creon every time you eat. Remember to follow your treatment plan closely and take Creon exactly as prescribed - consistency is key!!  Dose adjustments are normal with Creon.  Your doctor will start your on the dose they deem appropriate for you.   Remember to follow up with your doctor after the first two weeks to ensure your therapy is effective and you're managing your treatment appropriately.   Gastroparesis Please do small frequent meals like 4-6 meals a day.  Eat and drink liquids at separate times.  Avoid high fiber foods, cook your vegetables, avoid high fat food.  Suggest spreading protein throughout the day (greek yogurt, glucerna, soft meat, milk, eggs) Choose soft foods that you can mash with a fork When you are more symptomatic, change to pureed foods foods and liquids.  Consider reading "Living well with Gastroparesis" by Reuel Derby Gastroparesis  is a condition in which food takes longer than normal to empty from the stomach. This condition is also known as delayed gastric emptying. It is usually a long-term (chronic) condition. There is no cure, but there are treatments and things that you can do at home to help relieve symptoms. Treating the underlying condition that causes gastroparesis can also help relieve symptoms What are the causes? In many cases, the cause of this condition is not known. Possible causes include: A hormone (endocrine) disorder, such as hypothyroidism or diabetes. A nervous system disease, such as Parkinson's disease or multiple sclerosis. Cancer, infection, or surgery that affects the stomach or vagus nerve. The vagus nerve runs from your chest, through your neck, and to the lower part of your brain. A connective tissue disorder, such as scleroderma. Certain medicines. What increases the risk? You are more likely to develop this condition if: You have certain disorders or diseases. These may include: An endocrine disorder. An eating disorder. Amyloidosis. Scleroderma. Parkinson's disease. Multiple sclerosis. Cancer or infection of the stomach or the vagus nerve. You have had surgery on your stomach or vagus nerve. You take certain medicines. You are female. What are the signs or symptoms?  Symptoms of this condition include: Feeling full after eating very little or a loss of appetite. Nausea, vomiting, or heartburn. Bloating of your abdomen. Inconsistent blood sugar (glucose) levels on blood tests. Unexplained weight loss. Acid from the stomach coming up into the esophagus (gastroesophageal reflux). Sudden tightening (spasm) of the stomach, which can be painful. Symptoms may come and go. Some people may not notice any symptoms. How is this diagnosed? This condition is diagnosed with tests, such as: Tests that check how long it takes food to move through the stomach and intestines. These tests  include: Upper gastrointestinal (GI) series. For this test, you drink a liquid that shows up well on X-rays, and then X-rays are taken of your intestines. Gastric emptying scintigraphy. For this test, you eat food that contains a small amount of radioactive material, and then scans are taken. Wireless capsule GI monitoring system. For this test, you swallow a pill (capsule) that records information about how foods and fluid move through your stomach. Gastric manometry. For this test, a tube is passed down your throat and into your stomach to measure electrical and muscular activity. Endoscopy. For this test, a long, thin tube with a camera and light on the end is passed down your throat and into your stomach to check for problems in your stomach lining. Ultrasound. This test uses sound waves to create images of the inside of your body. This can help rule out gallbladder disease or pancreatitis as a cause of your symptoms. How is this treated? There is no cure for this condition, but treatment and home care may relieve symptoms. Treatment may include: Treating the underlying cause. Managing your symptoms by making changes to your diet and exercise habits. Taking medicines to control nausea and vomiting and to stimulate stomach muscles. Getting food through a feeding tube in the hospital. This may be done in severe cases. Having surgery to insert a device called a gastric electrical stimulator into your body. This device helps improve stomach emptying and control nausea and vomiting. Follow these instructions at home: Take over-the-counter and prescription medicines only as told by your health care provider. Follow instructions from your health care provider about eating or drinking restrictions. Your health care provider may recommend that you: Eat smaller meals more often. Eat low-fat foods. Eat low-fiber forms of high-fiber foods. For example, eat cooked vegetables instead of raw  vegetables. Have only liquid foods instead of solid foods. Liquid foods are easier to digest. Drink enough fluid to keep your urine pale yellow. Exercise as often as told by your health care provider. Keep all follow-up visits. This is important. Contact a health care provider if you: Notice that your symptoms do not improve with treatment. Have new symptoms. Get help right away if you: Have severe pain in your abdomen that does not improve with treatment. Have nausea that is severe or does not go away. Vomit every time you drink fluids. Summary Gastroparesis is a long-term (chronic) condition in which food takes longer than normal to empty from the stomach. Symptoms include nausea, vomiting, heartburn, bloating of your abdomen, and loss of appetite. Eating smaller portions, low-fat foods, and low-fiber forms of high-fiber foods may help you manage your symptoms. Get help right away if you have severe pain in your abdomen. This information is not intended to replace advice given to you by your health care provider. Make sure you discuss any questions you have with your health care provider. Document Revised: 11/29/2019 Document Reviewed: 11/29/2019 Elsevier Patient  Education  2021 [employer]  

## 2022-11-29 LAB — TISSUE TRANSGLUTAMINASE, IGA: (tTG) Ab, IgA: <1 U/mL

## 2022-12-06 LAB — LAB REPORT - SCANNED
Albumin, Urine POC: 0.6
Creatinine, POC: 44 mg/dL
EGFR: 57
Microalb Creat Ratio: 14

## 2022-12-11 ENCOUNTER — Telehealth: Payer: Self-pay

## 2022-12-11 NOTE — Telephone Encounter (Signed)
error 

## 2022-12-13 ENCOUNTER — Encounter (HOSPITAL_COMMUNITY): Payer: Self-pay

## 2022-12-13 ENCOUNTER — Other Ambulatory Visit (HOSPITAL_COMMUNITY): Payer: Self-pay | Admitting: Internal Medicine

## 2022-12-13 ENCOUNTER — Ambulatory Visit (HOSPITAL_COMMUNITY)
Admission: RE | Admit: 2022-12-13 | Discharge: 2022-12-13 | Disposition: A | Discharge status: Home / Self Care | Payer: Medicare HMO | Source: Ambulatory Visit | Attending: Physician Assistant | Admitting: Physician Assistant

## 2022-12-13 DIAGNOSIS — R195 Other fecal abnormalities: Secondary | ICD-10-CM | POA: Diagnosis present

## 2022-12-13 DIAGNOSIS — R1013 Epigastric pain: Secondary | ICD-10-CM | POA: Diagnosis present

## 2022-12-13 DIAGNOSIS — R110 Nausea: Secondary | ICD-10-CM | POA: Diagnosis present

## 2022-12-13 DIAGNOSIS — R634 Abnormal weight loss: Secondary | ICD-10-CM

## 2022-12-13 DIAGNOSIS — R1084 Generalized abdominal pain: Secondary | ICD-10-CM

## 2022-12-13 MED ORDER — IOHEXOL 9 MG/ML PO SOLN: 500.0000 mL | ORAL | Status: DC

## 2022-12-13 MED ORDER — IOHEXOL 9 MG/ML PO SOLN
ORAL | Status: AC
  Filled 2022-12-13: qty 1000

## 2022-12-13 MED ORDER — IOHEXOL 9 MG/ML PO SOLN
500.0000 mL | ORAL | Status: AC
  Administered 2022-12-13 (×2): 500 mL via ORAL

## 2022-12-13 MED ORDER — IOHEXOL 300 MG/ML  SOLN
100.0000 mL | Freq: Once | INTRAMUSCULAR | Status: AC | PRN
  Administered 2022-12-13: 75 mL via INTRAVENOUS

## 2023-01-02 ENCOUNTER — Telehealth: Payer: Self-pay | Admitting: Physician Assistant

## 2023-01-02 NOTE — Telephone Encounter (Signed)
Patient calling wishing to speak with Marchelle Folks regarding questions she has about endo/colon procedure. Please advise

## 2023-01-03 NOTE — Telephone Encounter (Signed)
Patient called in stating some of the symptoms that she was having earlier with postprandial abdominal pain, nausea, GERD has completely resolved and she would prefer to not proceed with endoscopy. She continues to have constipation, abnormal stools, maintaining her weight but has had weight loss and with family history and last colonoscopy 2017 will plan on continuing with colonoscopy but will cancel endoscopy secondary to patient's preference and symptom resolution.

## 2023-01-03 NOTE — Telephone Encounter (Signed)
Egd appt cancelled as requested.

## 2023-01-13 ENCOUNTER — Telehealth: Payer: Self-pay | Admitting: Physician Assistant

## 2023-01-13 NOTE — Telephone Encounter (Signed)
Spoke with pt and she had questions about keeping her sugar up since she will be on clear liquids. Discussed with her that she should not be doing sugar free drinks/diet drinks and that she should use regular jello not sugar free and that way her blood sugar should not drop. Pt verbalized understanding.

## 2023-01-13 NOTE — Telephone Encounter (Signed)
PT called to confirm that she is supposed to be on liquid diet as instructed due to her pre diabetes being controlled by diet. Please advise.

## 2023-01-16 ENCOUNTER — Encounter: Payer: Self-pay | Admitting: Gastroenterology

## 2023-01-16 ENCOUNTER — Ambulatory Visit (AMBULATORY_SURGERY_CENTER): Payer: Medicare HMO | Admitting: Gastroenterology

## 2023-01-16 VITALS — BP 143/66 | HR 61 | Temp 98.5°F | Resp 15 | Ht 63.0 in | Wt 104.0 lb

## 2023-01-16 DIAGNOSIS — Z800 Family history of malignant neoplasm of digestive organs: Secondary | ICD-10-CM

## 2023-01-16 DIAGNOSIS — Z1211 Encounter for screening for malignant neoplasm of colon: Secondary | ICD-10-CM | POA: Diagnosis not present

## 2023-01-16 MED ORDER — SODIUM CHLORIDE 0.9 % IV SOLN
500.0000 mL | INTRAVENOUS | Status: DC
Start: 2023-01-16 — End: 2023-01-16

## 2023-01-16 NOTE — Progress Notes (Signed)
Report to PACU, RN, vss, BBS= Clear.  

## 2023-01-16 NOTE — Patient Instructions (Addendum)
-   Resume previous diet. - Continue present medications. - Await pathology results. - No repeat colonoscopy due to age.   YOU HAD AN ENDOSCOPIC PROCEDURE TODAY AT THE [facility]:   Refer to the procedure report that was given to you for any specific questions about what was found during the examination.  If the procedure report does not answer your questions, please call your gastroenterologist to clarify.  If you requested that your care partner not be given the details of your procedure findings, then the procedure report has been included in a sealed envelope for you to review at your convenience later.  YOU SHOULD EXPECT: Some feelings of bloating in the abdomen. Passage of more gas than usual.  Walking can help get rid of the air that was put into your GI tract during the procedure and reduce the bloating. If you had a lower endoscopy (such as a colonoscopy or flexible sigmoidoscopy) you may notice spotting of blood in your stool or on the toilet paper. If you underwent a bowel prep for your procedure, you may not have a normal bowel movement for a few days.  Please Note:  You might notice some irritation and congestion in your nose or some drainage.  This is from the oxygen used during your procedure.  There is no need for concern and it should clear up in a day or so.  SYMPTOMS TO REPORT IMMEDIATELY:  Following lower endoscopy (colonoscopy or flexible sigmoidoscopy):  Excessive amounts of blood in the stool  Significant tenderness or worsening of abdominal pains  Swelling of the abdomen that is new, acute  Fever of 100F or higher  For urgent or emergent issues, a gastroenterologist can be reached at any hour by calling [phone]. Do not use MyChart messaging for urgent concerns.    DIET:  We do recommend a small meal at first, but then you may proceed to your regular diet.  Drink plenty of fluids but you should avoid alcoholic beverages for 24 hours.  ACTIVITY:   You should plan to take it easy for the rest of today and you should NOT DRIVE or use heavy machinery until tomorrow (because of the sedation medicines used during the test).    FOLLOW UP: Our staff will call the number listed on your records the next business day following your procedure.  We will call around 7:15- 8:00 am to check on you and address any questions or concerns that you may have regarding the information given to you following your procedure. If we do not reach you, we will leave a message.     If any biopsies were taken you will be contacted by phone or by letter within the next 1-3 weeks.  Please call us at [phone] if you have not heard about the biopsies in 3 weeks.    SIGNATURES/CONFIDENTIALITY: You and/or your care partner have signed paperwork which will be entered into your electronic medical record.  These signatures attest to the fact that that the information above on your After Visit Summary has been reviewed and is understood.  Full responsibility of the confidentiality of this discharge information lies with you and/or your care-partner.

## 2023-01-16 NOTE — Progress Notes (Signed)
[facility] History and Physical   Primary Care Physician:  Philip Aspen, Limmie Patricia, MD   Reason for Procedure:  Unintentional weight loss, Family history of colon cancer  Plan:    Screening colonoscopy with possible interventions as needed     HPI: Julie Duffy is a very pleasant 77 y.o. female here for colonoscopy for unintentional weight loss and family h/o colon cancer. Denies any nausea, vomiting, abdominal pain, melena or bright red blood per rectum  The risks and benefits as well as alternatives of endoscopic procedure(s) have been discussed and reviewed. All questions answered. The patient agrees to proceed.    Past Medical History:  Diagnosis Date   Allergy    seasonal   Anxiety    Constipation    COVID-19 virus infection 09/2019   Diabetes mellitus without complication (HCC)    H. pylori infection    Hypertension    Hypothyroid    Scoliosis     Past Surgical History:  Procedure Laterality Date   APPENDECTOMY     NASAL SINUS SURGERY     NECK LESION BIOPSY     cysts benign   TUBAL LIGATION     UMBILICAL HERNIA REPAIR      Prior to Admission medications   Medication Sig Start Date End Date Taking? Authorizing Provider  ALPRAZolam (XANAX) 0.5 MG tablet TAKE 1 TABLET BY MOUTH 2 TIMES DAILY. 09/11/22  Yes Philip Aspen, Limmie Patricia, MD  Cyanocobalamin (VITAMIN B-12 PO) Take 1 tablet by mouth daily.   Yes [provider]  levothyroxine (SYNTHROID) 100 MCG tablet Take 1 tablet Mon-Fri 01/14/22  Yes Philip Aspen, Limmie Patricia, MD  levothyroxine (SYNTHROID) 125 MCG tablet Take 1 tablet Sat and Sun 01/14/22  Yes Philip Aspen, Limmie Patricia, MD  loperamide (IMODIUM A-D) 2 MG tablet Take 4 mg by mouth as needed for diarrhea or loose stools.   Yes [provider]  atorvastatin (LIPITOR) 80 MG tablet Take 1 tablet (80 mg total) by mouth daily. Patient not taking: Reported on 11/28/2022 07/11/22   Philip Aspen, Limmie Patricia, MD  Blood  Glucose Monitoring Suppl (TRUE METRIX AIR GLUCOSE METER) DEVI 1 each by Does not apply route daily. 01/31/22   Philip Aspen, Limmie Patricia, MD  Continuous Blood Gluc Receiver (FREESTYLE LIBRE 2 READER) DEVI Use once daily for glucose control 02/27/22   Philip Aspen, Limmie Patricia, MD  Continuous Blood Gluc Sensor (FREESTYLE LIBRE 2 SENSOR) MISC Apply one sensor every 14 days. Use to check blood sugar. 02/27/22   Philip Aspen, Limmie Patricia, MD  Continuous Blood Gluc Sensor (FREESTYLE LIBRE 3 SENSOR) MISC Use one daily for glucose control. Dx E11.9 01/31/22   Philip Aspen, Limmie Patricia, MD  glucose blood (RELION TRUE METRIX TEST STRIPS) test strip 1 each by Other route daily. 01/31/22   Philip Aspen, Limmie Patricia, MD  insulin aspart (NOVOLOG FLEXPEN) 100 UNIT/ML FlexPen USE NOVOLOG SUBCUTANEOUS 3 TIMES DAILY WITH MEALS AS PER SLIDING SCALE 08/21/22   Burchette, Elberta Fortis, MD  insulin degludec (TRESIBA FLEXTOUCH) 200 UNIT/ML FlexTouch Pen Inject 6 Units into the skin daily. Patient not taking: Reported on 11/28/2022 01/31/22   Philip Aspen, Limmie Patricia, MD  Insulin Pen Needle 31G X 5 MM MISC 3 Units by Does not apply route daily. 01/25/22   Eulis Foster, FNP  saccharomyces boulardii (FLORASTOR) 250 MG capsule Take 1 capsule (250 mg total) by mouth 2 (two) times daily. Patient not taking: Reported on 11/28/2022 04/25/22   Philip Aspen,  Limmie Patricia, MD  TRUEplus Lancets 33G MISC 1 each by Does not apply route daily. 01/31/22   Philip Aspen, Limmie Patricia, MD    Current Outpatient Medications  Medication Sig Dispense Refill   ALPRAZolam (XANAX) 0.5 MG tablet TAKE 1 TABLET BY MOUTH 2 TIMES DAILY. 60 tablet 1   Cyanocobalamin (VITAMIN B-12 PO) Take 1 tablet by mouth daily.     levothyroxine (SYNTHROID) 100 MCG tablet Take 1 tablet Mon-Fri 90 tablet 1   levothyroxine (SYNTHROID) 125 MCG tablet Take 1 tablet Sat and Sun 90 tablet 1   loperamide (IMODIUM A-D) 2 MG tablet Take 4 mg by mouth as needed for diarrhea or  loose stools.     atorvastatin (LIPITOR) 80 MG tablet Take 1 tablet (80 mg total) by mouth daily. (Patient not taking: Reported on 11/28/2022) 90 tablet 1   Blood Glucose Monitoring Suppl (TRUE METRIX AIR GLUCOSE METER) DEVI 1 each by Does not apply route daily. 1 each 3   Continuous Blood Gluc Receiver (FREESTYLE LIBRE 2 READER) DEVI Use once daily for glucose control 1 each 1   Continuous Blood Gluc Sensor (FREESTYLE LIBRE 2 SENSOR) MISC Apply one sensor every 14 days. Use to check blood sugar. 1 each 6   Continuous Blood Gluc Sensor (FREESTYLE LIBRE 3 SENSOR) MISC Use one daily for glucose control. Dx E11.9 2 each 2   glucose blood (RELION TRUE METRIX TEST STRIPS) test strip 1 each by Other route daily. 100 each 12   insulin aspart (NOVOLOG FLEXPEN) 100 UNIT/ML FlexPen USE NOVOLOG SUBCUTANEOUS 3 TIMES DAILY WITH MEALS AS PER SLIDING SCALE 15 mL 0   insulin degludec (TRESIBA FLEXTOUCH) 200 UNIT/ML FlexTouch Pen Inject 6 Units into the skin daily. (Patient not taking: Reported on 11/28/2022) 200 mL 0   Insulin Pen Needle 31G X 5 MM MISC 3 Units by Does not apply route daily. 30 each 3   saccharomyces boulardii (FLORASTOR) 250 MG capsule Take 1 capsule (250 mg total) by mouth 2 (two) times daily. (Patient not taking: Reported on 11/28/2022) 60 capsule 2   TRUEplus Lancets 33G MISC 1 each by Does not apply route daily. 100 each 3   Current Facility-Administered Medications  Medication Dose Route Frequency Provider Last Rate Last Admin   0.9 %  sodium chloride infusion  500 mL Intravenous Continuous Desa Rech, Eleonore Chiquito, MD        Allergies as of 01/16/2023 - Review Complete 01/16/2023  Allergen Reaction Noted   Guaifenesin & derivatives Nausea And Vomiting 09/23/2012   Clindamycin Other (See Comments) 02/27/2015   Clindamycin/lincomycin cross reactors Other (See Comments) 10/08/2010   Codeine Other (See Comments)    Sulfa antibiotics Other (See Comments)     Family History  Problem Relation  Age of Onset   Cancer Mother 16       colon ca   Heart disease Mother    Colon cancer Mother    Thyroid disease Mother    Cancer Father    Thyroid disease Daughter    Esophageal cancer Neg Hx    Stomach cancer Neg Hx     Social History   Socioeconomic History   Marital status: Divorced    Spouse name: Not on file   Number of children: 3   Years of education: Not on file   Highest education level: Not on file  Occupational History   Occupation: CMA    Employer: [employer]  Tobacco Use   Smoking status: Never  Smokeless tobacco: Never  Vaping Use   Vaping Use: Never used  Substance and Sexual Activity   Alcohol use: No    Alcohol/week: 0.0 standard drinks of alcohol   Drug use: No   Sexual activity: Not Currently  Other Topics Concern   Not on file  Social History Narrative   Not on file   Social Determinants of Health   Financial Resource Strain: Not on file  Food Insecurity: Not on file  Transportation Needs: Not on file  Physical Activity: Not on file  Stress: Not on file  Social Connections: Not on file  Intimate Partner Violence: Not on file    Review of Systems:  All other review of systems negative except as mentioned in the HPI.  Physical Exam: Vital signs in last 24 hours: Blood Pressure (Abnormal) 174/106   Pulse 61   Temperature 98.5 F (36.9 C)   Respiration (Abnormal) 9   Height 5\' 3"  (1.6 m)   Weight 104 lb (47.2 kg)   Oxygen Saturation 97%   Body Mass Index 18.42 kg/m  General:   Alert, NAD Lungs:  Clear .   Heart:  Regular rate and rhythm Abdomen:  Soft, nontender and nondistended. Neuro/Psych:  Alert and cooperative. Normal mood and affect. A and O x 3  Reviewed labs, radiology imaging, old records and pertinent past GI work up  Patient is appropriate for planned procedure(s) and anesthesia in an ambulatory setting   K. Scherry Ran , MD [phone]

## 2023-01-16 NOTE — Op Note (Signed)
[facility] Patient Name: Julie Duffy Procedure Date: 01/16/2023 10:44 AM MRN: [id number] Endoscopist: Napoleon Form , MD, [id number] Age: 77 Referring MD:  Date of Birth: 09-30-1945 Gender: Female Account #: [id number] Procedure:                Colonoscopy Indications:              Screening patient at increased risk: Family history                            of 1st-degree relative with colorectal cancer at                            age 9 years (or older), Incidental change in bowel                            habits and weight loss noted Medicines:                Monitored Anesthesia Care Procedure:                Pre-Anesthesia Assessment:                           - Prior to the procedure, a History and Physical                            was performed, and patient medications and                            allergies were reviewed. The patient's tolerance of                            previous anesthesia was also reviewed. The risks                            and benefits of the procedure and the sedation                            options and risks were discussed with the patient.                            All questions were answered, and informed consent                            was obtained. Prior Anticoagulants: The patient has                            taken no anticoagulant or antiplatelet agents. ASA                            Grade Assessment: II - A patient with mild systemic                            disease. After reviewing the risks and benefits,  the patient was deemed in satisfactory condition to                            undergo the procedure.                           After obtaining informed consent, the colonoscope                            was passed under direct vision. Throughout the                            procedure, the patient's blood pressure, pulse, and                            oxygen saturations  were monitored continuously. The                            Olympus PCF-H190DL (#[id number]) Colonoscope was                            introduced through the anus and advanced to the the                            cecum, identified by appendiceal orifice and                            ileocecal valve. The colonoscopy was performed                            without difficulty. The patient tolerated the                            procedure well. The quality of the bowel                            preparation was good. The ileocecal valve,                            appendiceal orifice, and rectum were photographed. Scope In: 11:02:40 AM Scope Out: 11:20:22 AM Scope Withdrawal Time: 0 hours 10 minutes 21 seconds  Total Procedure Duration: 0 hours 17 minutes 42 seconds  Findings:                 The perianal and digital rectal examinations were                            normal.                           Non-bleeding internal hemorrhoids were found during                            retroflexion. The hemorrhoids were medium-sized.  The exam was otherwise without abnormality. Complications:            No immediate complications. Estimated Blood Loss:     Estimated blood loss: none. Impression:               - Non-bleeding internal hemorrhoids.                           - The examination was otherwise normal.                           - No specimens collected. Recommendation:           - Patient has a contact number available for                            emergencies. The signs and symptoms of potential                            delayed complications were discussed with the                            patient. Return to normal activities tomorrow.                            Written discharge instructions were provided to the                            patient.                           - Resume previous diet.                           - Continue present medications.                            - Await pathology results.                           - No repeat colonoscopy due to age. Napoleon Form, MD 01/16/2023 11:29:25 AM This report has been signed electronically.

## 2023-01-17 ENCOUNTER — Telehealth: Payer: Self-pay | Admitting: *Deleted

## 2023-01-17 NOTE — Telephone Encounter (Signed)
  Follow up Call-     01/16/2023   10:34 AM  Call back number  Post procedure Call Back phone  # [phone]  Permission to leave phone message Yes     Patient questions:  Do you have a fever, pain , or abdominal swelling? No. Pain Score  0 *  Have you tolerated food without any problems? Yes.    Have you been able to return to your normal activities? Yes.    Do you have any questions about your discharge instructions: Diet   No. Medications  No. Follow up visit  No.  Do you have questions or concerns about your Care? No.  Actions: * If pain score is 4 or above: No action needed, pain <4.

## 2023-03-13 ENCOUNTER — Other Ambulatory Visit: Payer: Self-pay | Admitting: Internal Medicine

## 2023-03-13 DIAGNOSIS — E039 Hypothyroidism, unspecified: Secondary | ICD-10-CM

## 2023-04-12 ENCOUNTER — Other Ambulatory Visit: Payer: Self-pay | Admitting: Internal Medicine

## 2023-04-12 DIAGNOSIS — E119 Type 2 diabetes mellitus without complications: Secondary | ICD-10-CM

## 2023-06-04 ENCOUNTER — Other Ambulatory Visit: Payer: Self-pay | Admitting: Nurse Practitioner

## 2023-06-04 ENCOUNTER — Ambulatory Visit
Admission: RE | Admit: 2023-06-04 | Discharge: 2023-06-04 | Disposition: A | Discharge status: Home / Self Care | Payer: Medicare HMO | Source: Ambulatory Visit | Attending: Nurse Practitioner | Admitting: Nurse Practitioner

## 2023-06-04 ENCOUNTER — Other Ambulatory Visit: Payer: Self-pay | Admitting: Internal Medicine

## 2023-06-04 DIAGNOSIS — M549 Dorsalgia, unspecified: Secondary | ICD-10-CM

## 2023-06-04 DIAGNOSIS — E119 Type 2 diabetes mellitus without complications: Secondary | ICD-10-CM

## 2023-06-10 ENCOUNTER — Other Ambulatory Visit: Payer: Self-pay | Admitting: Internal Medicine

## 2023-06-10 DIAGNOSIS — E119 Type 2 diabetes mellitus without complications: Secondary | ICD-10-CM

## 2024-02-02 ENCOUNTER — Other Ambulatory Visit: Payer: Self-pay | Admitting: Internal Medicine

## 2024-02-02 DIAGNOSIS — E119 Type 2 diabetes mellitus without complications: Secondary | ICD-10-CM

## 2024-02-20 ENCOUNTER — Encounter: Payer: Self-pay | Admitting: Advanced Practice Midwife
